# Patient Record
Sex: Female | Born: 1935 | Race: White | Hispanic: No | Smoking: Former smoker
Health system: Southern US, Community
[De-identification: ages and names within clinical notes are randomized; demographics above are authoritative.]

## PROBLEM LIST (undated history)

## (undated) DIAGNOSIS — I82409 Acute embolism and thrombosis of unspecified deep veins of unspecified lower extremity: Secondary | ICD-10-CM

## (undated) DIAGNOSIS — E039 Hypothyroidism, unspecified: Secondary | ICD-10-CM

## (undated) DIAGNOSIS — E119 Type 2 diabetes mellitus without complications: Secondary | ICD-10-CM

## (undated) DIAGNOSIS — E7800 Pure hypercholesterolemia, unspecified: Secondary | ICD-10-CM

## (undated) DIAGNOSIS — I639 Cerebral infarction, unspecified: Secondary | ICD-10-CM

## (undated) HISTORY — DX: Pure hypercholesterolemia, unspecified: E78.00

## (undated) HISTORY — DX: Type 2 diabetes mellitus without complications: E11.9

## (undated) HISTORY — DX: Hypothyroidism, unspecified: E03.9

## (undated) HISTORY — PX: NO PAST SURGERIES: SHX2092

## (undated) HISTORY — DX: Acute embolism and thrombosis of unspecified deep veins of unspecified lower extremity: I82.409

## (undated) HISTORY — DX: Cerebral infarction, unspecified: I63.9

---

## 1997-11-12 ENCOUNTER — Other Ambulatory Visit: Admission: RE | Admit: 1997-11-12 | Discharge: 1997-11-12 | Payer: Self-pay | Admitting: Gynecology

## 1997-12-13 ENCOUNTER — Ambulatory Visit (HOSPITAL_BASED_OUTPATIENT_CLINIC_OR_DEPARTMENT_OTHER): Admission: RE | Admit: 1997-12-13 | Discharge: 1997-12-13 | Payer: Self-pay | Admitting: Surgery

## 1998-06-29 DIAGNOSIS — Z86718 Personal history of other venous thrombosis and embolism: Secondary | ICD-10-CM

## 1998-06-29 HISTORY — DX: Personal history of other venous thrombosis and embolism: Z86.718

## 1998-12-19 ENCOUNTER — Other Ambulatory Visit: Admission: RE | Admit: 1998-12-19 | Discharge: 1998-12-19 | Payer: Self-pay | Admitting: Gynecology

## 2000-03-10 ENCOUNTER — Other Ambulatory Visit: Admission: RE | Admit: 2000-03-10 | Discharge: 2000-03-10 | Payer: Self-pay | Admitting: Gynecology

## 2001-04-26 ENCOUNTER — Other Ambulatory Visit: Admission: RE | Admit: 2001-04-26 | Discharge: 2001-04-26 | Payer: Self-pay | Admitting: Gynecology

## 2002-05-03 ENCOUNTER — Other Ambulatory Visit: Admission: RE | Admit: 2002-05-03 | Discharge: 2002-05-03 | Payer: Self-pay | Admitting: Gynecology

## 2003-03-06 ENCOUNTER — Encounter: Payer: Self-pay | Admitting: Emergency Medicine

## 2003-03-06 ENCOUNTER — Observation Stay (HOSPITAL_COMMUNITY): Admission: EM | Admit: 2003-03-06 | Discharge: 2003-03-07 | Payer: Self-pay | Admitting: Emergency Medicine

## 2003-10-25 ENCOUNTER — Other Ambulatory Visit: Admission: RE | Admit: 2003-10-25 | Discharge: 2003-10-25 | Payer: Self-pay | Admitting: Dermatology

## 2004-12-17 ENCOUNTER — Other Ambulatory Visit: Admission: RE | Admit: 2004-12-17 | Discharge: 2004-12-17 | Payer: Self-pay | Admitting: Gynecology

## 2005-03-24 ENCOUNTER — Encounter (INDEPENDENT_AMBULATORY_CARE_PROVIDER_SITE_OTHER): Payer: Self-pay | Admitting: Internal Medicine

## 2005-03-24 ENCOUNTER — Ambulatory Visit: Payer: Self-pay | Admitting: Internal Medicine

## 2005-03-24 ENCOUNTER — Ambulatory Visit (HOSPITAL_COMMUNITY): Admission: RE | Admit: 2005-03-24 | Discharge: 2005-03-24 | Payer: Self-pay | Admitting: Internal Medicine

## 2006-03-18 ENCOUNTER — Other Ambulatory Visit: Admission: RE | Admit: 2006-03-18 | Discharge: 2006-03-18 | Payer: Self-pay | Admitting: Gynecology

## 2007-03-23 ENCOUNTER — Other Ambulatory Visit: Admission: RE | Admit: 2007-03-23 | Discharge: 2007-03-23 | Payer: Self-pay | Admitting: Gynecology

## 2007-05-28 ENCOUNTER — Emergency Department (HOSPITAL_COMMUNITY): Admission: EM | Admit: 2007-05-28 | Discharge: 2007-05-28 | Payer: Self-pay | Admitting: Family Medicine

## 2010-04-17 ENCOUNTER — Ambulatory Visit: Payer: Self-pay | Admitting: Internal Medicine

## 2010-04-24 ENCOUNTER — Ambulatory Visit (HOSPITAL_COMMUNITY)
Admission: RE | Admit: 2010-04-24 | Discharge: 2010-04-24 | Payer: Self-pay | Source: Home / Self Care | Admitting: Internal Medicine

## 2010-04-24 ENCOUNTER — Ambulatory Visit: Payer: Self-pay | Admitting: Internal Medicine

## 2010-11-14 NOTE — Discharge Summary (Signed)
Maria, Alexander NO.:  [id number]   MEDICAL RECORD NO.:  [id number]                   PATIENT TYPE:  INP   LOCATION:  0355                                 FACILITY:  [facility]   PHYSICIAN:  Jackie Plum, M.D.             DATE OF BIRTH:  [birth year]   DATE OF ADMISSION:  03/06/2003  DATE OF DISCHARGE:  03/07/2003                                 DISCHARGE SUMMARY   DISCHARGE DIAGNOSES:  1. Intermittent shortness of breath with dyspnea on exertion, probably     secondary to diastolic heart failure, to rule out angina as an     outpatient.  The patient is scheduled for an outpatient 2-dimensional     echocardiogram and a stress test.  2. Systolic hypertension.  3. Dyslipidemia.  4. Decreased thyroid stimulating hormone without any clinical evidence of     hyperthyroidism.  An outpatient followup with free T4 and T3 is     recommended.  5. Hyperglycemia with increased hemoglobin A1c, to rule out diabetes     mellitus with fasting blood glucose as an outpatient with her primary     care physician, in view of a family history of diabetes.  6. Macrocytic anemia, consistent with iron deficiency:  The patient alleges     that she had a colonoscopy two years ago which was probably normal.     Outpatient followup with her primary care physician is recommended.   DISCHARGE MEDICATIONS:  1. Aspirin 325 mg p.o. daily.  2. Avapro 150 mg daily.  3. Lipitor which has been increased to 40 mg p.o. q.h.s.  4. Multivitamin 1 tablet p.o. daily.  5. Iron sulfate 325 mg p.o. b.i.d.   DISCHARGE LABORATORY DATA:  WBC count 7.0, hemoglobin 10.7, MCV 74.5,  hematocrit 33.3, platelet count 247.  ESR 27, INR 1.0, PTT 31.  Sodium 141,  potassium 4.6, chloride 105, CO2 of 29, glucose 161, BUN 9, creatinine 0.8,  calcium 9.7.  Hemoglobin A1c of 8.5%.  TSH 0.031.  Iron 20, total iron  binding capacity 451, iron saturation 5%, Ferritin 4.   ACTIVITY:  As tolerated.   DIET:  To be carried out by special instructions.  She will report to her  M.D. if she experiences any problems.  She has been counseled to change her  eating habits and try to eat more fruits and vegetables.   FOLLOW UP:  For a followup appointment, the patient is to call her primary  care physician, Dr. Delaney Meigs, to see him in the next 7-10 days.  She is to follow up with Dr. Cristy Hilts. Jacinto Halim on March 20, 2003.  Prior to  this she will go to Dr. Verl Dicker office on March 14, 2003, for a 2-D  echocardiogram as well as a stress test.   REASON FOR ADMISSION:  Intermittent two to three-month-history of shortness  of breath and dyspnea on  exertion.   HISTORY OF PRESENT ILLNESS:  The patient is a 77 year old Caucasian lady who  presented with a history of shortness of breath intermittently which was  worse with exertion.  No history of fever, chills, sore throat, sinus  infection, nasal congestion, cough, sputum production, or chest pain.  She  did not have any palpitations, paroxysmal nocturnal dyspnea, or orthopnea.  She is post-menopausal and denies having any abnormal vaginal bleeding.  She  did not have any lower extremity swelling, no pain.   PHYSICAL EXAMINATION:  On admission her systolic blood pressure was  elevated, and her cardiac examination did not reveal any gallops.  She had  pale conjunctivae without any icterus.  She did not have any edema in her  extremities.   LABORATORY DATA:  Her admitting electrocardiogram was notable for a sinus  rhythm at 82 beats per minute without any acute ST-wave changes. X-rays  showed mildly hyperinflated lung fields without any acute infiltrates or  cardiomegaly.  Hemoglobin 10.7 with MCV of 74.5.  Her glucose was mildly  elevated at 121 with normal renal function.  Her liver function tests were  unremarkable.   HOSPITAL COURSE:  The patient lives about 30 minutes from [place], and  therefore I felt it was expedient to admit  her for 24 to 48-hours  observation to rule out a cardiac ischemia/myocardial infarction.  1. SHORTNESS OF BREATH/DYSPNEA ON EXERTION:  The patient was admitted to a     telemetry bed.  A myocardial infarction was ruled out by serial cardiac     enzymes.  There were no significant dysrhythmias on telemetry monitoring.     The patient was seen in consultation by Dr. Jacinto Halim of cardiology, who felt     that the patient may have diastolic heart failure; however, he agreed     that he could not rule out angina.  The patient looks very great and     asymptomatic at rest, and Dr. Jacinto Halim thought it might be expedient to just     discharge the patient today and schedule her for outpatient stress test     and a 2-D echocardiogram, as mentioned above.  She is being discharged     home on Avapro added to her medication regimen, in view of her systolic     hypertension.  Her discharge blood pressure was 127 mmHg.  The patient     has expressed subjective improvement in her symptoms of shortness of     breath.  Her last O2 saturation on room air was 96%.  2. IRON DEFICIENCY ANEMIA:  The patient was found to have macrocytic anemia.     An anemia workup indicated that she has iron deficiency anemia, as     mentioned above.  The patient said that she had a normal colonoscopy     about two years ago.  She also reiterates that she lives by herself and     has been eating a lot of snacks, and has not been eating well.  I believe     that her iron deficiency anemia may be related to diet, which is quite     uncommon but it is possible, and I have already spent some time     counseling her about dietary discretion.  She is going to be followed by     her primary care physician, Dr. Lysbeth Galas, as an outpatient, at which time     her colonoscopy report should be reviewed, and she  need to be reinforced     and counseled and guided in dietary choices, and a followup of her    hemoglobin.  She has been started on a  multivitamin and iron tablets.  If     she shows no improvement within three to six months on these medications     and with an appropriate diet discretion, the patient may benefit from a     hematology consultation as an outpatient.  3. HYPERGLYCEMIA WITH INCREASED HEMOGLOBIN A1C:  The patient was noted to be     hyperglycemic with an admitting glucose level of 161 mg/dL.  She as a     history of diabetes mellitus in her brother.  Her hemoglobin A1c was     elevated at 8.5%.  The patient could not be specifically ruled out for     diabetes during this hospitalization, and she could benefit from an     outpatient fasting glucose to rule out possible diabetes.  No dietary     restrictions have been placed on her.  She said she eats a lot of sweets,     and I counseled with her the need to stop these practices.  4. DYSLIPIDEMIA:  The patient was on Lipitor for dyslipidemia.  A fasting     lipid panel was requested during this hospitalization but, however,     unfortunately could not be obtained prior to discharge.  Her Lipitor will     be increased to 40 mg p.o. daily from 20 mg.  She will need a repeat of     her liver function tests in about three months.    NOTATION:  I spent more than 50 minutes discharging this patient today.  I  spent a good amount of time talking to the patient and her sister and  brother at the bedside.  During this time I explained all the workup which  had been done during her hospitalization and subsequent workup that she  needs for followup.  They expressed understanding.                                                Jackie Plum, M.D.    GO/MEDQ  D:  03/07/2003  T:  03/07/2003  Job:  [id number]   cc:   Delaney Meigs, M.D.  [address]  [place]  [place] [address]  Fax: [phone]   Yates Decamp, M.D.

## 2010-11-14 NOTE — H&P (Signed)
Maria Alexander, [name] NO.:  [id number]   MEDICAL RECORD NO.:  [id number]                   PATIENT TYPE:  EMS   LOCATION:  ED                                   FACILITY:  [facility]   PHYSICIAN:  Jackie Plum, M.D.             DATE OF BIRTH:  October 18, 1923   DATE OF ADMISSION:  03/06/2003  DATE OF DISCHARGE:                                HISTORY & PHYSICAL   REASON FOR ADMISSION:  For 24 to 48-hour observation.   PROBLEM LIST:  1. Two to three-month history of shortness of breath and dyspnea on     exertion, to rule out anginal equivalent.  2. Microcytic anemia with admitting hemoglobin of 10.7, MCV 74.5, RDW 15.8,     to rule out iron deficiency anemia.  3. Hyperglycemia with glucose of 151, mild.  Rule out diabetes mellitus.  4. History of dyslipidemia.  5. Questionable history of hypertension.   CHIEF COMPLAINT:  Shortness of breath.   HISTORY OF PRESENT ILLNESS:  The patient presents with a two to three-month  history of shortness of breath intermittently which is worse with exertion  and when she is going up stairs.  She denies any history of fever, chills,  sore throat, sinus drainage, congestion, cough, sputum production, chest  pain, palpitations, paroxysmal nocturnal dyspnea, or orthopnea.  No history  of abdominal pain, nausea, vomiting, diarrhea, black or bloody stools, or  melena.  No history of dysuria or frequency of urination.  The patient is  postmenopausal and does not have any abnormal vaginal bleeding.  She denies  any lower extremity swelling or pain.  She does not have any skin rashes.  She denies any headaches, loss of consciousness, or dizziness.  No history  of motor or sensory deficit.   PAST MEDICAL HISTORY:  She gives history of previous left lower extremity  deep vein thrombosis which was presumed to be related to Prempro which she  has stopped taking since then.  Otherwise, past medical history is as noted  in  Problem List above.   The patient is unsure whether or not she may have hypertension.  She is a  twin and had twin sister claim that she also has hypertension; however, she  has not been taking medications for this.   ALLERGIES:  No medication allergies.   CURRENT MEDICATIONS:  1. Lipitor 20 mg daily.  2. Aspirin 1 tablet daily.  3. Fosamax weekly.  4. Xanax as needed.   FAMILY HISTORY:  Father died of heart attack at age 31, and brother had  heart surgery.  No evidence of diabetes in family.   SOCIAL HISTORY:  The patient is a retired Education officer, community.  She has two grownup  children.  She lives by herself. She is not married and does not drink  alcohol or smoke cigarettes.   REVIEW OF SYSTEMS:  Significant postives and negatives per HPI  above,  otherwise Review of Systems unremarkable.   PHYSICAL EXAMINATION:  VITAL SIGNS:  Blood pressure 178/84, temperature  96.5, pulse rate 89, respiratory rate 18.  O2 saturation 98% on room air.  GENERAL:  At admission, she was not acutely ill looking.  She was not in  acute pulmonary distress.  HEENT:  Normocephalic and atraumatic.  Pupils are equal, round, and reactive  to light.  She had mild conjunctival pallor without icterus.  Oropharynx was  moist without any oropharyngeal exudate or erythema.  NECK:  No JVD appreciated.  No carotid bruits appreciated.  LUNGS:  Breath sounds were vesicular.  HEART:  Without gallops or murmurs.  ABDOMEN:  Smooth, nontender.  No organomegaly was appreciated.  EXTREMITIES:  No edema.  Dorsalis pedis pulse was present about 2+.  NEUROLOGIC:  Alert and oriented x 3 without focal deficits.   LABORATORY DATA:  A 12-lead EKG: Sinus rhythm at 82 beats per minute with T  wave inversion in V1.  No acute ST wave changes.   Chest x-ray showed mildly hyperventilated lung fields without acute features  or cardiomegaly.   CBC: White cell count 7.0, hemoglobin 10.7, hematocrit 33.3, MCV 74.5,  Platelet count 247.   Sodium 141, potassium 4.6, chloride 105, CO2 29,  glucose 161, BUN 9, creatinine 0.8, calcium 9.7, total protein 7.5, albumin  4.0, AST 19, ALT 18, alkaline phosphatase 76, total bilirubin 0.5.  First  set of enzymes: CK total 105, CK-MB 3.6, relative index slightly elevated at  3.4, troponin I 0.01.   ASSESSMENT:  A 77 year old lady presenting to two to three-month history of  shortness of breath without any chest pain, orthopnea, or paroxysmal  nocturnal dyspnea.  The patient is mildly pale on exam, has hemoglobin of  10.7 with MCV of 74.5 consistent with microcytic anemia.  No history of  bright red blood per rectum, melanic stools, or abnormal bleeding.  She does  not have any hemoptysis or hematemesis.  She is postmenopausal.  She has a  history of dyslipidemia and some history of heart disease with question of  history of hypertension.  The 12-lead EKG and chest x-ray were unremarkable.   My impression is shortness of breath in a 77 year old lady with significant  cardiac risk factors to rule out anginal equivalent.  The patient has  microcytic anemia which may account for her symptoms; however, the degree of  anemia is inconsistent with the degree of he symptomatology.    PLAN:  The plan of care will be to admit the patient to a telemetry bed,  obtain serial cardiac enzymes and possible stress test in the morning if she  rules out.  Will obtain TSH and fasting lipid panel in the morning.  She  will also be given supportive care as needed.  Anemia workup will be  obtained during this hospitalization.                                               Jackie Plum, M.D.    GO/MEDQ  D:  03/06/2003  T:  03/06/2003  Job:  [id number]   cc:   Delaney Meigs, M.D.  [address]  [place]  [place] [address]  Fax: [phone]

## 2010-11-14 NOTE — Op Note (Signed)
NAMEREYNALDA, CANNY                ACCOUNT NO.:  [id number]   MEDICAL RECORD NO.:  [id number]          PATIENT TYPE:  AMB   LOCATION:  DAY                           FACILITY:  [facility]   PHYSICIAN:  Lionel December, M.D.    DATE OF BIRTH:  1924/04/10   DATE OF PROCEDURE:  03/24/2005  DATE OF DISCHARGE:                                 OPERATIVE REPORT   PROCEDURE:  Colonoscopy.   INDICATIONS:  Maria Alexander is 77 year old Caucasian female with history of  colonic polyps. Her last exam was by Dr. Marcell Anger four or five years ago  with removal of three polyps. She opted to have this exam at [facility]. Procedure risks were reviewed with the patient, and informed  consent was obtained.   PREMEDICATION:  Demerol 50 mg IV, Versed 6 mg IV.   FINDINGS:  Procedure performed in endoscopy suite. The patient's vital signs  and O2 saturation were monitored during the procedure remained stable. The  patient was placed in left lateral position and rectal examination  performed. She had large soft sentinel skin tags. Digital exam was normal.  Scope was placed in rectum and advanced under vision into sigmoid colon  which was noncompliant with a tight loop. Had to use abdominal pressure and  loop in order to pass the scope into descending colon. Further intubation  cecum was easy. Preparation was excellent. Few small diverticula were noted  at sigmoid colon. Cecal landmarks, i.e. appendiceal orifice, ileocecal valve  were well seen and picture taken for the record. There was single small  polyp across from the ileocecal valve which was ablated via cold biopsy.  Mucosa of the rest of the colon was normal. Rectal mucosa similarly was  normal. Scope was retroflexed to examine anorectal junction, and small  hemorrhoids were noted below the dentate line. Endoscope was straightened  and withdrawn. The patient tolerated the procedure well.   FINAL DIAGNOSES:  1.  Small cecal polyp which was ablated via  cold biopsy.  2.  Few tiny diverticula at sigmoid colon and external hemorrhoids.   RECOMMENDATIONS:  Standard instructions given. I will be contacting the  patient with biopsy results and further recommendations.      Lionel December, M.D.  Electronically Signed     NR/MEDQ  D:  03/24/2005  T:  03/24/2005  Job:  [id number]   cc:   Gretta Cool, M.D.  Fax: [phone]   Delaney Meigs, M.D.  Fax: [phone]

## 2014-06-06 ENCOUNTER — Ambulatory Visit (INDEPENDENT_AMBULATORY_CARE_PROVIDER_SITE_OTHER): Payer: Medicare Other | Admitting: Internal Medicine

## 2014-06-06 ENCOUNTER — Encounter (INDEPENDENT_AMBULATORY_CARE_PROVIDER_SITE_OTHER): Payer: Self-pay | Admitting: Internal Medicine

## 2014-06-06 ENCOUNTER — Encounter (INDEPENDENT_AMBULATORY_CARE_PROVIDER_SITE_OTHER): Payer: Self-pay | Admitting: *Deleted

## 2014-06-06 VITALS — BP 138/84 | HR 64 | Temp 97.4°F | Ht 63.0 in | Wt 133.6 lb

## 2014-06-06 DIAGNOSIS — R1314 Dysphagia, pharyngoesophageal phase: Secondary | ICD-10-CM

## 2014-06-06 DIAGNOSIS — E7800 Pure hypercholesterolemia, unspecified: Secondary | ICD-10-CM

## 2014-06-06 DIAGNOSIS — E1165 Type 2 diabetes mellitus with hyperglycemia: Secondary | ICD-10-CM | POA: Insufficient documentation

## 2014-06-06 DIAGNOSIS — E039 Hypothyroidism, unspecified: Secondary | ICD-10-CM | POA: Insufficient documentation

## 2014-06-06 DIAGNOSIS — E1122 Type 2 diabetes mellitus with diabetic chronic kidney disease: Secondary | ICD-10-CM | POA: Insufficient documentation

## 2014-06-06 DIAGNOSIS — E119 Type 2 diabetes mellitus without complications: Secondary | ICD-10-CM | POA: Insufficient documentation

## 2014-06-06 NOTE — Patient Instructions (Signed)
Esophagram. Chew food well. Small portions.

## 2014-06-06 NOTE — Progress Notes (Signed)
   Subjective:    Patient ID: Maria Alexander, female    DOB: 1923-09-07, 80 y.o.   MRN: [id number]  HPI Presents today with c/o dysphagia. She tells me recently, she choked on a piece of pork chop.  She thinks it went down her "windpipe". She then says it may have lodged in her esophagus.   This has occurred 3 times within the month. Appetite is good. No weight loss.  She can eat about anything she wants. She cannot eat shell fish.  BMs are moving normal.  Sister has hx of dysphagia and has undergone an EGD/ED in the past    Review of Systems  Married. Three children in good health. Retired. Widowed  Past Medical History  Diagnosis Date  . Diabetes   . High cholesterol   . Hypothyroid     No past surgical history on file.  Allergies not on file  No current outpatient prescriptions on file prior to visit.   No current facility-administered medications on file prior to visit.        Objective:   Physical Exam  Filed Vitals:   06/06/14 1600  Height: 5\' 3"  (1.6 m)  Weight: 133 lb 9.6 oz (60.601 kg)  Alert and oriented. Skin warm and dry. Oral mucosa is moist.   . Sclera anicteric, conjunctivae is pink. Thyroid not enlarged. No cervical lymphadenopathy. Lungs clear. Heart regular rate and rhythm.  Abdomen is soft. Bowel sounds are positive. No hepatomegaly. No abdominal masses felt. No tenderness.  No edema to lower extremities.           Assessment & Plan:  DG esophagram. Further recommendations to follow.

## 2014-06-07 ENCOUNTER — Ambulatory Visit (HOSPITAL_COMMUNITY)
Admission: RE | Admit: 2014-06-07 | Discharge: 2014-06-07 | Disposition: A | Discharge status: Home / Self Care | Payer: Medicare Other | Source: Ambulatory Visit | Attending: Internal Medicine | Admitting: Internal Medicine

## 2014-06-07 DIAGNOSIS — R1310 Dysphagia, unspecified: Secondary | ICD-10-CM | POA: Insufficient documentation

## 2014-06-07 DIAGNOSIS — R1314 Dysphagia, pharyngoesophageal phase: Secondary | ICD-10-CM

## 2014-07-05 ENCOUNTER — Telehealth (INDEPENDENT_AMBULATORY_CARE_PROVIDER_SITE_OTHER): Payer: Self-pay | Admitting: *Deleted

## 2014-07-05 NOTE — Telephone Encounter (Signed)
[employer] called and said insurance denied payment for DOS: 06/06/14. They will not make payment on a mid-level, Doctor only.

## 2014-07-09 NOTE — Telephone Encounter (Signed)
Sent in a corrected claim request.  Called Ms. Hakes and she was told the same.

## 2014-07-11 ENCOUNTER — Ambulatory Visit (INDEPENDENT_AMBULATORY_CARE_PROVIDER_SITE_OTHER): Payer: Medicare Other | Admitting: Internal Medicine

## 2014-07-11 ENCOUNTER — Encounter (INDEPENDENT_AMBULATORY_CARE_PROVIDER_SITE_OTHER): Payer: Self-pay | Admitting: Internal Medicine

## 2014-07-11 VITALS — BP 128/60 | HR 66 | Temp 97.9°F | Ht 64.0 in | Wt 135.0 lb

## 2014-07-11 DIAGNOSIS — R1314 Dysphagia, pharyngoesophageal phase: Secondary | ICD-10-CM

## 2014-07-11 NOTE — Patient Instructions (Signed)
OV in 1 year.  If any problems call our office.

## 2014-07-11 NOTE — Progress Notes (Signed)
   Subjective:    Patient ID: Maria Alexander, female    DOB: October 09, 1923, 81 y.o.   MRN: [id number]  HPIHere today for f/u of her dysphagia.  She tells me today she is not having any problems with dysphagia now.  She tells me she is doing good. No foods are lodging. She chews her foods well. Her appetite has remained good. No weight loss. BMs are normal. No melena or BRRB. She exercises at least once a week, walks 35 minutes. Her last colonoscopy was in 2011 revealed two small diverticula at ascending colon, otherwise normal colonoscopy. 06/07/2014 DG Esophagram. Dysphagia IMPRESSION: Mild functional narrowing at the gastroesophageal junction as described. The barium tablet did eventually pass.  Otherwise unremarkable barium swallow.         Review of Systems Past Medical History  Diagnosis Date  . Diabetes   . High cholesterol   . Hypothyroid     No past surgical history on file.  No Known Allergies  Current Outpatient Prescriptions on File Prior to Visit  Medication Sig Dispense Refill  . atorvastatin (LIPITOR) 80 MG tablet Take 40 mg by mouth daily.     Marland Kitchen glipiZIDE (GLUCOTROL XL) 2.5 MG 24 hr tablet Take 2.5 mg by mouth daily with breakfast.    . levothyroxine (SYNTHROID, LEVOTHROID) 50 MCG tablet Take 50 mcg by mouth daily before breakfast.    . losartan (COZAAR) 100 MG tablet Take 100 mg by mouth daily.    . Multiple Vitamin (MULTIVITAMIN) tablet Take 1 tablet by mouth daily.    . Omega-3 Fatty Acids (FISH OIL) 1000 MG CAPS Take by mouth.     No current facility-administered medications on file prior to visit.    Widowed. Three children. One deceased from a fall (alcoholic)    Objective:   Physical Exam  Filed Vitals:   07/11/14 0937  Height: 5\' 4"  (1.626 m)  Weight: 135 lb (61.236 kg)   Alert and oriented. Skin warm and dry. Oral mucosa is moist.   . Sclera anicteric, conjunctivae is pink. Thyroid not enlarged. No cervical lymphadenopathy. Lungs clear. Heart  regular rate and rhythm.  Abdomen is soft. Bowel sounds are positive. No hepatomegaly. No abdominal masses felt. No tenderness.  No edema to lower extremities.          Assessment & Plan:  Dysphagia. No problems swallowing now.  Will see back in one year. If any problems, call our office.

## 2015-01-10 ENCOUNTER — Other Ambulatory Visit: Payer: Self-pay

## 2015-01-10 DIAGNOSIS — I7092 Chronic total occlusion of artery of the extremities: Secondary | ICD-10-CM

## 2015-02-04 ENCOUNTER — Encounter: Payer: Self-pay | Admitting: Vascular Surgery

## 2015-02-05 ENCOUNTER — Encounter: Payer: Self-pay | Admitting: Vascular Surgery

## 2015-02-05 ENCOUNTER — Ambulatory Visit (INDEPENDENT_AMBULATORY_CARE_PROVIDER_SITE_OTHER): Payer: Medicare Other | Admitting: Vascular Surgery

## 2015-02-05 ENCOUNTER — Ambulatory Visit (HOSPITAL_COMMUNITY)
Admission: RE | Admit: 2015-02-05 | Discharge: 2015-02-05 | Disposition: A | Discharge status: Home / Self Care | Payer: Medicare Other | Source: Ambulatory Visit | Attending: Vascular Surgery | Admitting: Vascular Surgery

## 2015-02-05 VITALS — BP 132/74 | HR 73 | Ht 64.0 in | Wt 134.1 lb

## 2015-02-05 DIAGNOSIS — S81002A Unspecified open wound, left knee, initial encounter: Secondary | ICD-10-CM

## 2015-02-05 DIAGNOSIS — R208 Other disturbances of skin sensation: Secondary | ICD-10-CM | POA: Insufficient documentation

## 2015-02-05 DIAGNOSIS — I7092 Chronic total occlusion of artery of the extremities: Secondary | ICD-10-CM

## 2015-02-05 DIAGNOSIS — E785 Hyperlipidemia, unspecified: Secondary | ICD-10-CM | POA: Insufficient documentation

## 2015-02-05 DIAGNOSIS — S91002A Unspecified open wound, left ankle, initial encounter: Secondary | ICD-10-CM | POA: Diagnosis not present

## 2015-02-05 DIAGNOSIS — S81802A Unspecified open wound, left lower leg, initial encounter: Secondary | ICD-10-CM

## 2015-02-05 DIAGNOSIS — Z87891 Personal history of nicotine dependence: Secondary | ICD-10-CM | POA: Diagnosis not present

## 2015-02-05 DIAGNOSIS — E119 Type 2 diabetes mellitus without complications: Secondary | ICD-10-CM | POA: Insufficient documentation

## 2015-02-05 NOTE — Progress Notes (Signed)
Patient name: Maria Alexander MRN: [id number] DOB: 1923/10/27 Sex: female   Referred by: Maria Alexander  Reason for referral:  Chief Complaint  Patient presents with  . New Evaluation    eval PAD - c/o bilateral LE coldness X 6 months or more    HISTORY OF PRESENT ILLNESS: This today for evaluation of lower from the arterial circulation. She is an extremely pleasant 81 year old female who is very active and very healthy. She had trivial trauma to her pretibial area on the left and has had prolonged healing of this. She also is concerned that her feet feel cold to her from a sensation standpoint and she was concerned about arterial flow. She is being treated appropriately should. She has had one round of oral anabiotic 7 has been treated with Silvadene over the open area as well. She denies any other tissue loss. Does have a remote history of DVT approximately 15 years ago but does not have any sequela of this. He specifically does not have any swelling. She is not on anticoagulation therapy  Past Medical History  Diagnosis Date  . Diabetes   . High cholesterol   . Hypothyroid   . Stroke   . DVT (deep venous thrombosis)     History reviewed. No pertinent past surgical history.  History   Social History  . Marital Status: Widowed    Spouse Name: N/A  . Number of Children: N/A  . Years of Education: N/A   Occupational History  . Not on file.   Social History Main Topics  . Smoking status: Former Smoker    Types: Cigarettes    Quit date: 02/05/1995  . Smokeless tobacco: Not on file  . Alcohol Use: No  . Drug Use: No  . Sexual Activity: Not on file   Other Topics Concern  . Not on file   Social History Narrative    Family History  Problem Relation Age of Onset  . Cancer Mother   . Heart disease Father     before age 94  . Heart attack Father   . Diabetes Sister   . Diabetes Brother     Allergies as of 02/05/2015  . (No Known Allergies)    Current Outpatient  Prescriptions on File Prior to Visit  Medication Sig Dispense Refill  . Aspirin-Acetaminophen-Caffeine (GOODY HEADACHE PO) Take by mouth every morning.    Marland Kitchen atorvastatin (LIPITOR) 80 MG tablet Take 40 mg by mouth daily.     Marland Kitchen glipiZIDE (GLUCOTROL XL) 2.5 MG 24 hr tablet Take 5 mg by mouth daily with breakfast.     . levothyroxine (SYNTHROID, LEVOTHROID) 50 MCG tablet Take 50 mcg by mouth daily before breakfast.    . losartan (COZAAR) 100 MG tablet Take 100 mg by mouth daily.    . Multiple Vitamin (MULTIVITAMIN) tablet Take 1 tablet by mouth daily.     No current facility-administered medications on file prior to visit.     REVIEW OF SYSTEMS:  Positives indicated with an "X"  CARDIOVASCULAR:  [ ]  chest pain   [ ]  chest pressure   [ ]  palpitations   [ ]  orthopnea   [ ]  dyspnea on exertion   [ ]  claudication   [ ]  rest pain   [x ] DVT   [ ]  phlebitis PULMONARY:   [ ]  productive cough   [ ]  asthma   [ ]  wheezing NEUROLOGIC:   [ ]  weakness  [ ]  paresthesias  [ ]   aphasia  [ ]  amaurosis  [ ]  dizziness HEMATOLOGIC:   [ ]  bleeding problems   [ ]  clotting disorders MUSCULOSKELETAL:  [ ]  joint pain   [ ]  joint swelling GASTROINTESTINAL: [ ]   blood in stool  [ ]   hematemesis GENITOURINARY:  [ ]   dysuria  [ ]   hematuria PSYCHIATRIC:  [ ]  history of major depression INTEGUMENTARY:  [ ]  rashes  [ ]  ulcers CONSTITUTIONAL:  [ ]  fever   [ ]  chills  PHYSICAL EXAMINATION:  General: The patient is a well-nourished female, in no acute distress. Vital signs are BP 132/74 mmHg  Pulse 73  Ht 5\' 4"  (1.626 m)  Wt 134 lb 1.6 oz (60.827 kg)  BMI 23.01 kg/m2  SpO2 96% Pulmonary: There is a good air exchange  Musculoskeletal: There are no major deformities.  There is no significant extremity pain. Neurologic: No focal weakness or paresthesias are detected, Skin: Does have a 2 x 3 cm superficial ulceration over her pretibial area from skin tear. This is not any significant surrounding erythema Psychiatric:  The patient has normal affect. Cardiovascular: 2+ radial, 2+ dorsalis pedis and 2+ posterior tibial pulses bilaterally   [facility] Vascular Lab Studies:  Ordered and Independently Reviewed normal ankle arm index and normal triphasic tibial waveforms bilaterally  Impression and Plan:  No evidence of peripheral vascular occlusive disease and no evidence of sequela from her old history of DVT. I reassured her that she should not have difficulty initially healing this skin tear on her pretibial area. Do not see any indication for repeat oral anabiotic. Explained the need to continue the Silvadene ointment treatment. She was reassured with this discussion will see [place] again on as-needed basis    Maria Alexander [facility] Office: [phone]

## 2015-02-12 ENCOUNTER — Encounter: Payer: Self-pay | Admitting: Physician Assistant

## 2015-02-19 ENCOUNTER — Encounter (HOSPITAL_COMMUNITY): Payer: Medicare Other

## 2015-02-19 ENCOUNTER — Encounter: Payer: Medicare Other | Admitting: Vascular Surgery

## 2015-05-08 ENCOUNTER — Encounter (INDEPENDENT_AMBULATORY_CARE_PROVIDER_SITE_OTHER): Payer: Self-pay | Admitting: *Deleted

## 2015-07-18 ENCOUNTER — Ambulatory Visit (INDEPENDENT_AMBULATORY_CARE_PROVIDER_SITE_OTHER): Payer: Medicare Other | Admitting: Internal Medicine

## 2016-09-16 ENCOUNTER — Telehealth: Payer: Self-pay | Admitting: Physician Assistant

## 2016-09-16 MED ORDER — DOXYCYCLINE HYCLATE 100 MG PO TABS: 100.0000 mg | ORAL_TABLET | Freq: Two times a day (BID) | ORAL | 0 refills | Status: DC

## 2016-09-16 MED ORDER — PREDNISONE 10 MG (21) PO TBPK: ORAL_TABLET | ORAL | 0 refills | Status: DC

## 2016-11-12 ENCOUNTER — Ambulatory Visit (INDEPENDENT_AMBULATORY_CARE_PROVIDER_SITE_OTHER): Payer: Medicare Other | Admitting: Physician Assistant

## 2016-11-12 ENCOUNTER — Encounter: Payer: Self-pay | Admitting: Physician Assistant

## 2016-11-12 VITALS — BP 125/71 | HR 74 | Temp 98.0°F | Ht 64.0 in | Wt 128.0 lb

## 2016-11-12 DIAGNOSIS — E039 Hypothyroidism, unspecified: Secondary | ICD-10-CM

## 2016-11-12 DIAGNOSIS — E119 Type 2 diabetes mellitus without complications: Secondary | ICD-10-CM | POA: Diagnosis not present

## 2016-11-12 DIAGNOSIS — E7800 Pure hypercholesterolemia, unspecified: Secondary | ICD-10-CM | POA: Diagnosis not present

## 2016-11-12 LAB — BAYER DCA HB A1C WAIVED: HB A1C (BAYER DCA - WAIVED): 7.7 % — ABNORMAL HIGH (ref <7.0)

## 2016-11-12 MED ORDER — LORATADINE 10 MG PO TABS: 10.0000 mg | ORAL_TABLET | Freq: Every day | ORAL | 11 refills | Status: DC

## 2016-11-12 MED ORDER — LEVOTHYROXINE SODIUM 50 MCG PO TABS: 50.0000 ug | ORAL_TABLET | Freq: Every day | ORAL | 11 refills | Status: DC

## 2016-11-12 MED ORDER — LOSARTAN POTASSIUM 100 MG PO TABS: 100.0000 mg | ORAL_TABLET | Freq: Every day | ORAL | 11 refills | Status: DC

## 2016-11-12 MED ORDER — ALPRAZOLAM 1 MG PO TABS: 1.0000 mg | ORAL_TABLET | Freq: Two times a day (BID) | ORAL | 5 refills | Status: DC | PRN

## 2016-11-12 MED ORDER — GLIPIZIDE ER 5 MG PO TB24: 10.0000 mg | ORAL_TABLET | Freq: Every day | ORAL | 11 refills | Status: DC

## 2016-11-12 MED ORDER — ATORVASTATIN CALCIUM 80 MG PO TABS: 40.0000 mg | ORAL_TABLET | Freq: Every day | ORAL | 11 refills | Status: DC

## 2016-11-12 NOTE — Progress Notes (Signed)
BP 125/71   Pulse 74   Temp 98 F (36.7 C) (Oral)   Ht 5' 4" (1.626 m)   Wt 128 lb (58.1 kg)   BMI 21.97 kg/m    Subjective:    Patient ID: Maria Alexander, female    DOB: Jul 31, 1923, 83 y.o.   MRN: [id number]  HPI: Maria Alexander is a 83 y.o. female presenting on 11/12/2016 for Establish Care  This patient comes in for periodic recheck on medications and conditions including Hypothyroidism, diabetes well controlled, hyper lipidemia, overall the patient is doing very well. She reports that she does need refills. She has not have blood work done since last summer. She has not seen her endocrinologist in [place] anymore due to that she cannot drive that far anymore..   All medications are reviewed today. There are no reports of any problems with the medications. All of the medical conditions are reviewed and updated.  Lab work is reviewed and will be ordered as medically necessary. There are no new problems reported with today's visit.   Relevant past medical, surgical, family and social history reviewed and updated as indicated. Allergies and medications reviewed and updated.  Past Medical History:  Diagnosis Date  . Diabetes ([place])   . DVT (deep venous thrombosis) ([place])   . High cholesterol   . Hypothyroid   . Stroke [facility])     History reviewed. No pertinent surgical history.  Review of Systems  Constitutional: Negative.  Negative for activity change, fatigue and fever.  HENT: Negative.   Eyes: Negative.   Respiratory: Negative.  Negative for cough.   Cardiovascular: Negative.  Negative for chest pain.  Gastrointestinal: Negative.  Negative for abdominal pain.  Endocrine: Negative.   Genitourinary: Negative.  Negative for dysuria.  Musculoskeletal: Negative.   Skin: Negative.   Neurological: Negative.     Allergies as of 11/12/2016   No Known Allergies     Medication List       Accurate as of 11/12/16  2:04 PM. Always use your most recent med list.          ALPRAZolam 1 MG tablet Commonly known as:  XANAX Take 1 tablet (1 mg total) by mouth 2 (two) times daily as needed for anxiety.   atorvastatin 80 MG tablet Commonly known as:  LIPITOR Take 0.5 tablets (40 mg total) by mouth daily.   glipiZIDE 5 MG 24 hr tablet Commonly known as:  GLUCOTROL XL Take 2 tablets (10 mg total) by mouth daily with breakfast.   GOODY HEADACHE PO Take by mouth every morning.   levothyroxine 50 MCG tablet Commonly known as:  SYNTHROID, LEVOTHROID Take 1 tablet (50 mcg total) by mouth daily before breakfast.   loratadine 10 MG tablet Commonly known as:  CLARITIN Take 1 tablet (10 mg total) by mouth daily.   losartan 100 MG tablet Commonly known as:  COZAAR Take 1 tablet (100 mg total) by mouth daily.   multivitamin tablet Take 1 tablet by mouth daily.   silver sulfADIAZINE 1 % cream Commonly known as:  SILVADENE Apply 1 application topically daily.          Objective:    BP 125/71   Pulse 74   Temp 98 F (36.7 C) (Oral)   Ht 5' 4" (1.626 m)   Wt 128 lb (58.1 kg)   BMI 21.97 kg/m   No Known Allergies  Physical Exam  Constitutional: She is oriented to person, place, and time. She appears well-developed  and well-nourished.  HENT:  Head: Normocephalic and atraumatic.  Right Ear: Tympanic membrane, external ear and ear canal normal.  Left Ear: Tympanic membrane, external ear and ear canal normal.  Nose: Nose normal. No rhinorrhea.  Mouth/Throat: Oropharynx is clear and moist and mucous membranes are normal. No oropharyngeal exudate or posterior oropharyngeal erythema.  Eyes: Conjunctivae and EOM are normal. Pupils are equal, round, and reactive to light.  Neck: Normal range of motion. Neck supple.  Cardiovascular: Normal rate, regular rhythm, normal heart sounds and intact distal pulses.   Pulmonary/Chest: Effort normal and breath sounds normal.  Abdominal: Soft. Bowel sounds are normal.  Neurological: She is alert and oriented to  person, place, and time. She has normal reflexes.  Skin: Skin is warm and dry. No rash noted.  Psychiatric: She has a normal mood and affect. Her behavior is normal. Judgment and thought content normal.    Results for orders placed or performed in visit on 11/12/16  Bayer DCA Hb A1c Waived  Result Value Ref Range   Bayer DCA Hb A1c Waived 7.7 (H) <7.0 %      Assessment & Plan:   1. Acquired hypothyroidism - TSH - levothyroxine (SYNTHROID, LEVOTHROID) 50 MCG tablet; Take 1 tablet (50 mcg total) by mouth daily before breakfast.  Dispense: 30 tablet; Refill: 11  2. Type 2 diabetes mellitus without complication, without long-term current use of insulin (HCC) - CMP14+EGFR - CBC with Differential/Platelet - Lipid panel - Bayer DCA Hb A1c Waived - glipiZIDE (GLUCOTROL XL) 5 MG 24 hr tablet; Take 2 tablets (10 mg total) by mouth daily with breakfast.  Dispense: 30 tablet; Refill: 11  3. High cholesterol - CMP14+EGFR - CBC with Differential/Platelet - Lipid panel - atorvastatin (LIPITOR) 80 MG tablet; Take 0.5 tablets (40 mg total) by mouth daily.  Dispense: 30 tablet; Refill: 11    Current Outpatient Prescriptions:  .  ALPRAZolam (XANAX) 1 MG tablet, Take 1 tablet (1 mg total) by mouth 2 (two) times daily as needed for anxiety., Disp: 30 tablet, Rfl: 5 .  Aspirin-Acetaminophen-Caffeine (GOODY HEADACHE PO), Take by mouth every morning., Disp: , Rfl:  .  atorvastatin (LIPITOR) 80 MG tablet, Take 0.5 tablets (40 mg total) by mouth daily., Disp: 30 tablet, Rfl: 11 .  glipiZIDE (GLUCOTROL XL) 5 MG 24 hr tablet, Take 2 tablets (10 mg total) by mouth daily with breakfast., Disp: 30 tablet, Rfl: 11 .  levothyroxine (SYNTHROID, LEVOTHROID) 50 MCG tablet, Take 1 tablet (50 mcg total) by mouth daily before breakfast., Disp: 30 tablet, Rfl: 11 .  losartan (COZAAR) 100 MG tablet, Take 1 tablet (100 mg total) by mouth daily., Disp: 30 tablet, Rfl: 11 .  Multiple Vitamin (MULTIVITAMIN) tablet, Take 1  tablet by mouth daily., Disp: , Rfl:  .  loratadine (CLARITIN) 10 MG tablet, Take 1 tablet (10 mg total) by mouth daily., Disp: 30 tablet, Rfl: 11 .  silver sulfADIAZINE (SILVADENE) 1 % cream, Apply 1 application topically daily. , Disp: , Rfl:   Continue all other maintenance medications as listed above.  Follow up plan: Return in about 6 months (around 05/15/2017) for recheck.  Educational handout given for dash diet  Terald Sleeper PA-C [facility] [address]  [place], [place] [address] [phone]   11/12/2016, 2:04 PM

## 2016-11-12 NOTE — Patient Instructions (Signed)
DASH Eating Plan DASH stands for "Dietary Approaches to Stop Hypertension." The DASH eating plan is a healthy eating plan that has been shown to reduce high blood pressure (hypertension). It may also reduce your risk for type 2 diabetes, heart disease, and stroke. The DASH eating plan may also help with weight loss. What are tips for following this plan? General guidelines  Avoid eating more than 2,300 mg (milligrams) of salt (sodium) a day. If you have hypertension, you may need to reduce your sodium intake to 1,500 mg a day.  Limit alcohol intake to no more than 1 drink a day for nonpregnant women and 2 drinks a day for men. One drink equals 12 oz of beer, 5 oz of wine, or 1 oz of hard liquor.  Work with your health care provider to maintain a healthy body weight or to lose weight. Ask what an ideal weight is for you.  Get at least 30 minutes of exercise that causes your heart to beat faster (aerobic exercise) most days of the week. Activities may include walking, swimming, or biking.  Work with your health care provider or diet and nutrition specialist (dietitian) to adjust your eating plan to your individual calorie needs. Reading food labels  Check food labels for the amount of sodium per serving. Choose foods with less than 5 percent of the Daily Value of sodium. Generally, foods with less than 300 mg of sodium per serving fit into this eating plan.  To find whole grains, look for the word "whole" as the first word in the ingredient list. Shopping  Buy products labeled as "low-sodium" or "no salt added."  Buy fresh foods. Avoid canned foods and premade or frozen meals. Cooking  Avoid adding salt when cooking. Use salt-free seasonings or herbs instead of table salt or sea salt. Check with your health care provider or pharmacist before using salt substitutes.  Do not fry foods. Cook foods using healthy methods such as baking, boiling, grilling, and broiling instead.  Cook with  heart-healthy oils, such as olive, canola, soybean, or sunflower oil. Meal planning   Eat a balanced diet that includes: ? 5 or more servings of fruits and vegetables each day. At each meal, try to fill half of your plate with fruits and vegetables. ? Up to 6-8 servings of whole grains each day. ? Less than 6 oz of lean meat, poultry, or fish each day. A 3-oz serving of meat is about the same size as a deck of cards. One egg equals 1 oz. ? 2 servings of low-fat dairy each day. ? A serving of nuts, seeds, or beans 5 times each week. ? Heart-healthy fats. Healthy fats called Omega-3 fatty acids are found in foods such as flaxseeds and coldwater fish, like sardines, salmon, and mackerel.  Limit how much you eat of the following: ? Canned or prepackaged foods. ? Food that is high in trans fat, such as fried foods. ? Food that is high in saturated fat, such as fatty meat. ? Sweets, desserts, sugary drinks, and other foods with added sugar. ? Full-fat dairy products.  Do not salt foods before eating.  Try to eat at least 2 vegetarian meals each week.  Eat more home-cooked food and less restaurant, buffet, and fast food.  When eating at a restaurant, ask that your food be prepared with less salt or no salt, if possible. What foods are recommended? The items listed may not be a complete list. Talk with your dietitian about what   dietary choices are best for you. Grains Whole-grain or whole-wheat bread. Whole-grain or whole-wheat pasta. Brown rice. Oatmeal. Quinoa. Bulgur. Whole-grain and low-sodium cereals. Pita bread. Low-fat, low-sodium crackers. Whole-wheat flour tortillas. Vegetables Fresh or frozen vegetables (raw, steamed, roasted, or grilled). Low-sodium or reduced-sodium tomato and vegetable juice. Low-sodium or reduced-sodium tomato sauce and tomato paste. Low-sodium or reduced-sodium canned vegetables. Fruits All fresh, dried, or frozen fruit. Canned fruit in natural juice (without  added sugar). Meat and other protein foods Skinless chicken or turkey. Ground chicken or turkey. Pork with fat trimmed off. Fish and seafood. Egg whites. Dried beans, peas, or lentils. Unsalted nuts, nut butters, and seeds. Unsalted canned beans. Lean cuts of beef with fat trimmed off. Low-sodium, lean deli meat. Dairy Low-fat (1%) or fat-free (skim) milk. Fat-free, low-fat, or reduced-fat cheeses. Nonfat, low-sodium ricotta or cottage cheese. Low-fat or nonfat yogurt. Low-fat, low-sodium cheese. Fats and oils Soft margarine without trans fats. Vegetable oil. Low-fat, reduced-fat, or light mayonnaise and salad dressings (reduced-sodium). Canola, safflower, olive, soybean, and sunflower oils. Avocado. Seasoning and other foods Herbs. Spices. Seasoning mixes without salt. Unsalted popcorn and pretzels. Fat-free sweets. What foods are not recommended? The items listed may not be a complete list. Talk with your dietitian about what dietary choices are best for you. Grains Baked goods made with fat, such as croissants, muffins, or some breads. Dry pasta or rice meal packs. Vegetables Creamed or fried vegetables. Vegetables in a cheese sauce. Regular canned vegetables (not low-sodium or reduced-sodium). Regular canned tomato sauce and paste (not low-sodium or reduced-sodium). Regular tomato and vegetable juice (not low-sodium or reduced-sodium). Pickles. Olives. Fruits Canned fruit in a light or heavy syrup. Fried fruit. Fruit in cream or butter sauce. Meat and other protein foods Fatty cuts of meat. Ribs. Fried meat. Bacon. Sausage. Bologna and other processed lunch meats. Salami. Fatback. Hotdogs. Bratwurst. Salted nuts and seeds. Canned beans with added salt. Canned or smoked fish. Whole eggs or egg yolks. Chicken or turkey with skin. Dairy Whole or 2% milk, cream, and half-and-half. Whole or full-fat cream cheese. Whole-fat or sweetened yogurt. Full-fat cheese. Nondairy creamers. Whipped toppings.  Processed cheese and cheese spreads. Fats and oils Butter. Stick margarine. Lard. Shortening. Ghee. Bacon fat. Tropical oils, such as coconut, palm kernel, or palm oil. Seasoning and other foods Salted popcorn and pretzels. Onion salt, garlic salt, seasoned salt, table salt, and sea salt. Worcestershire sauce. Tartar sauce. Barbecue sauce. Teriyaki sauce. Soy sauce, including reduced-sodium. Steak sauce. Canned and packaged gravies. Fish sauce. Oyster sauce. Cocktail sauce. Horseradish that you find on the shelf. Ketchup. Mustard. Meat flavorings and tenderizers. Bouillon cubes. Hot sauce and Tabasco sauce. Premade or packaged marinades. Premade or packaged taco seasonings. Relishes. Regular salad dressings. Where to find more information:  National Heart, Lung, and Blood Institute: www.nhlbi.nih.gov  American Heart Association: www.heart.org Summary  The DASH eating plan is a healthy eating plan that has been shown to reduce high blood pressure (hypertension). It may also reduce your risk for type 2 diabetes, heart disease, and stroke.  With the DASH eating plan, you should limit salt (sodium) intake to 2,300 mg a day. If you have hypertension, you may need to reduce your sodium intake to 1,500 mg a day.  When on the DASH eating plan, aim to eat more fresh fruits and vegetables, whole grains, lean proteins, low-fat dairy, and heart-healthy fats.  Work with your health care provider or diet and nutrition specialist (dietitian) to adjust your eating plan to your individual   calorie needs. This information is not intended to replace advice given to you by your health care provider. Make sure you discuss any questions you have with your health care provider. Document Released: 06/04/2011 Document Revised: 06/08/2016 Document Reviewed: 06/08/2016 Elsevier Interactive Patient Education  2017 Elsevier Inc.  

## 2016-11-13 LAB — CMP14+EGFR
A/G RATIO: 2 (ref 1.2–2.2)
ALT: 21 IU/L (ref 0–32)
AST: 23 IU/L (ref 0–40)
Albumin: 4.2 g/dL (ref 3.2–4.6)
Alkaline Phosphatase: 63 IU/L (ref 39–117)
BUN/Creatinine Ratio: 18 (ref 12–28)
BUN: 16 mg/dL (ref 10–36)
Bilirubin Total: 0.6 mg/dL (ref 0.0–1.2)
CALCIUM: 10 mg/dL (ref 8.7–10.3)
CO2: 28 mmol/L (ref 18–29)
Chloride: 97 mmol/L (ref 96–106)
Creatinine, Ser: 0.88 mg/dL (ref 0.57–1.00)
GFR calc non Af Amer: 58 mL/min/{1.73_m2} — ABNORMAL LOW (ref >59)
GFR, EST AFRICAN AMERICAN: 67 mL/min/{1.73_m2} (ref >59)
GLOBULIN, TOTAL: 2.1 g/dL (ref 1.5–4.5)
Glucose: 166 mg/dL — ABNORMAL HIGH (ref 65–99)
POTASSIUM: 4.7 mmol/L (ref 3.5–5.2)
SODIUM: 139 mmol/L (ref 134–144)
TOTAL PROTEIN: 6.3 g/dL (ref 6.0–8.5)

## 2016-11-13 LAB — CBC WITH DIFFERENTIAL/PLATELET
BASOS: 0 %
Basophils Absolute: 0 10*3/uL (ref 0.0–0.2)
EOS (ABSOLUTE): 0.1 10*3/uL (ref 0.0–0.4)
EOS: 1 %
Hematocrit: 38 % (ref 34.0–46.6)
Hemoglobin: 12.4 g/dL (ref 11.1–15.9)
IMMATURE GRANS (ABS): 0 10*3/uL (ref 0.0–0.1)
IMMATURE GRANULOCYTES: 0 %
LYMPHS: 23 %
Lymphocytes Absolute: 1.3 10*3/uL (ref 0.7–3.1)
MCH: 31 pg (ref 26.6–33.0)
MCHC: 32.6 g/dL (ref 31.5–35.7)
MCV: 95 fL (ref 79–97)
MONOS ABS: 0.5 10*3/uL (ref 0.1–0.9)
Monocytes: 8 %
Neutrophils Absolute: 3.8 10*3/uL (ref 1.4–7.0)
Neutrophils: 68 %
Platelets: 154 10*3/uL (ref 150–379)
RBC: 4 x10E6/uL (ref 3.77–5.28)
RDW: 15.9 % — AB (ref 12.3–15.4)
WBC: 5.6 10*3/uL (ref 3.4–10.8)

## 2016-11-13 LAB — LIPID PANEL
CHOL/HDL RATIO: 3.4 ratio (ref 0.0–4.4)
Cholesterol, Total: 181 mg/dL (ref 100–199)
HDL: 54 mg/dL (ref >39)
LDL Calculated: 95 mg/dL (ref 0–99)
TRIGLYCERIDES: 160 mg/dL — AB (ref 0–149)
VLDL CHOLESTEROL CAL: 32 mg/dL (ref 5–40)

## 2016-11-13 LAB — TSH: TSH: 1.33 u[IU]/mL (ref 0.450–4.500)

## 2016-12-03 ENCOUNTER — Other Ambulatory Visit: Payer: Self-pay | Admitting: Physician Assistant

## 2016-12-03 ENCOUNTER — Other Ambulatory Visit: Payer: Self-pay | Admitting: Family Medicine

## 2016-12-03 MED ORDER — ALPRAZOLAM 1 MG PO TABS: 1.0000 mg | ORAL_TABLET | Freq: Two times a day (BID) | ORAL | 0 refills | Status: DC | PRN

## 2016-12-03 NOTE — Progress Notes (Signed)
Covering for PCP  It appears that her usual Rx is 60 pills, pt requesting to change from 30 to 60. ! Month has been approved to call in for 60 pills.   Will forward to PCP to decide for the remainder of her refills.   Laroy Apple, MD [facility] 12/03/2016, 5:57 PM

## 2016-12-04 NOTE — Progress Notes (Signed)
Patient aware, script is ready.  Xanax refill called to pharmacy per Dr. Wendi Snipes.

## 2017-01-15 ENCOUNTER — Ambulatory Visit (INDEPENDENT_AMBULATORY_CARE_PROVIDER_SITE_OTHER): Payer: Medicare Other | Admitting: Physician Assistant

## 2017-01-15 ENCOUNTER — Encounter: Payer: Self-pay | Admitting: Physician Assistant

## 2017-01-15 VITALS — BP 158/72 | HR 74 | Temp 96.9°F | Ht 64.0 in | Wt 131.0 lb

## 2017-01-15 DIAGNOSIS — R490 Dysphonia: Secondary | ICD-10-CM

## 2017-01-15 NOTE — Patient Instructions (Signed)
Hoarseness Hoarseness is any abnormal change in your voice.Hoarseness can make it difficult to speak. Your voice may sound raspy, breathy, or strained. Hoarseness is caused by a problem with the vocal cords. The vocal cords are two bands of tissue inside your voice box (larynx). When you speak, your vocal cords move back and forth to create sound. The surfaces of your vocal cords need to be smooth for your voice to sound clear. Swelling or lumps on the vocal cords can cause hoarseness. Common causes of vocal cord problems include:  Upper airway infection.  A long-term cough.  Straining or overusing your voice.  Smoking.  Allergies.  Vocal cord growths.  Stomach acids that flow up from your stomach and irritate your vocal cords (gastroesophageal reflux).  Follow these instructions at home: Watch your condition for any changes. To ease any discomfort that you feel:  Rest your voice. Do not whisper. Whispering can cause muscle strain.  Do not speak in a loud or harsh voice that makes your hoarseness worse.  Do not use any tobacco products, including cigarettes, chewing tobacco, or electronic cigarettes. If you need help quitting, ask your health care provider.  Avoid secondhand smoke.  Do not eat foods that give you heartburn. Heartburn can make gastroesophageal reflux worse.  Do not drink coffee.  Do not drink alcohol.  Drink enough fluids to keep your urine clear or pale yellow.  Use a humidifier if the air in your home is dry.  Contact a health care provider if:  You have hoarseness that lasts longer than 3 weeks.  You almost lose or completelylose your voice for longer than 3 days.  You have pain when you swallow or try to talk.  You feel a lump in your neck. Get help right away if:  You have trouble swallowing.  You feel as though you are choking when you swallow.  You cough up blood or vomit blood.  You have trouble breathing. This information is not  intended to replace advice given to you by your health care provider. Make sure you discuss any questions you have with your health care provider. Document Released: 05/29/2005 Document Revised: 11/21/2015 Document Reviewed: 06/06/2014 Elsevier Interactive Patient Education  2018 Elsevier Inc.  

## 2017-01-15 NOTE — Progress Notes (Signed)
BP (!) 158/72   Pulse 74   Temp (!) 96.9 F (36.1 C) (Oral)   Ht 5' 4"  (1.626 m)   Wt 131 lb (59.4 kg)   BMI 22.49 kg/m    Subjective:    Patient ID: Maria Alexander, female    DOB: [birth year], 83 y.o.   MRN: [id number]  HPI: Maria Alexander is a 83 y.o. female presenting on 01/15/2017 for Referral [facility] )  This patient comes in for periodic recheck on medications and conditions including chronic hoarseness. She has infrequent GERD at this time, no nocturnal symptoms. No fever or chills.  Had A recent episode of dysphasia related to eating meat. She does not do this very often. We have tried Claritin for the past month and had no improvement in the hoarseness. She can discontinue it at this time. She can keep the medication and use it as needed for seasonal allergies. I've asked her to try to keep track of any reflux symptoms that she might be having and how her voice performs. Remake an ear nose and throat appointment for her to have this evaluated  All medications are reviewed today. There are no reports of any problems with the medications. All of the medical conditions are reviewed and updated.  Lab work is reviewed and will be ordered as medically necessary. There are no new problems reported with today's visit.   Relevant past medical, surgical, family and social history reviewed and updated as indicated. Allergies and medications reviewed and updated.  Past Medical History:  Diagnosis Date  . Diabetes ([place])   . DVT (deep venous thrombosis) ([place])   . High cholesterol   . Hypothyroid   . Stroke [facility])     History reviewed. No pertinent surgical history.  Review of Systems  Constitutional: Negative.  Negative for activity change, fatigue and fever.  HENT: Positive for trouble swallowing and voice change. Negative for postnasal drip.   Eyes: Negative.   Respiratory: Negative.  Negative for cough.   Cardiovascular: Negative.  Negative for chest pain.  Gastrointestinal:  Negative.  Negative for abdominal distention, abdominal pain and vomiting.  Endocrine: Negative.   Genitourinary: Negative.  Negative for dysuria.  Musculoskeletal: Negative.   Skin: Negative.   Neurological: Negative.     Allergies as of 01/15/2017   No Known Allergies     Medication List       Accurate as of 01/15/17 11:39 AM. Always use your most recent med list.          ALPRAZolam 1 MG tablet Commonly known as:  XANAX Take 1 tablet (1 mg total) by mouth 2 (two) times daily as needed for anxiety.   atorvastatin 80 MG tablet Commonly known as:  LIPITOR Take 0.5 tablets (40 mg total) by mouth daily.   glipiZIDE 5 MG 24 hr tablet Commonly known as:  GLUCOTROL XL Take 2 tablets (10 mg total) by mouth daily with breakfast.   GOODY HEADACHE PO Take by mouth every morning.   levothyroxine 50 MCG tablet Commonly known as:  SYNTHROID, LEVOTHROID Take 1 tablet (50 mcg total) by mouth daily before breakfast.   loratadine 10 MG tablet Commonly known as:  CLARITIN Take 1 tablet (10 mg total) by mouth daily.   losartan 100 MG tablet Commonly known as:  COZAAR Take 1 tablet (100 mg total) by mouth daily.   multivitamin tablet Take 1 tablet by mouth daily.   silver sulfADIAZINE 1 % cream Commonly known as:  SILVADENE  Apply 1 application topically daily.          Objective:    BP (!) 158/72   Pulse 74   Temp (!) 96.9 F (36.1 C) (Oral)   Ht 5' 4"  (1.626 m)   Wt 131 lb (59.4 kg)   BMI 22.49 kg/m   No Known Allergies  Physical Exam  Constitutional: She is oriented to person, place, and time. She appears well-developed and well-nourished.  HENT:  Head: Normocephalic and atraumatic.  Right Ear: Tympanic membrane, external ear and ear canal normal.  Left Ear: Tympanic membrane, external ear and ear canal normal.  Nose: Nose normal. No rhinorrhea.  Mouth/Throat: Oropharynx is clear and moist and mucous membranes are normal. No oropharyngeal exudate or posterior  oropharyngeal erythema.  Eyes: Pupils are equal, round, and reactive to light. Conjunctivae and EOM are normal.  Neck: Normal range of motion. Neck supple.  Cardiovascular: Normal rate, regular rhythm, normal heart sounds and intact distal pulses.   Pulmonary/Chest: Effort normal and breath sounds normal.  Abdominal: Soft. Bowel sounds are normal.  Neurological: She is alert and oriented to person, place, and time. She has normal reflexes.  Skin: Skin is warm and dry. No rash noted.  Psychiatric: She has a normal mood and affect. Her behavior is normal. Judgment and thought content normal.  Nursing note and vitals reviewed.   Results for orders placed or performed in visit on 11/12/16  CMP14+EGFR  Result Value Ref Range   Glucose 166 (H) 65 - 99 mg/dL   BUN 16 10 - 36 mg/dL   Creatinine, Ser 0.88 0.57 - 1.00 mg/dL   GFR calc non Af Amer 58 (L) >59 mL/min/1.73   GFR calc Af Amer 67 >59 mL/min/1.73   BUN/Creatinine Ratio 18 12 - 28   Sodium 139 134 - 144 mmol/L   Potassium 4.7 3.5 - 5.2 mmol/L   Chloride 97 96 - 106 mmol/L   CO2 28 18 - 29 mmol/L   Calcium 10.0 8.7 - 10.3 mg/dL   Total Protein 6.3 6.0 - 8.5 g/dL   Albumin 4.2 3.2 - 4.6 g/dL   Globulin, Total 2.1 1.5 - 4.5 g/dL   Albumin/Globulin Ratio 2.0 1.2 - 2.2   Bilirubin Total 0.6 0.0 - 1.2 mg/dL   Alkaline Phosphatase 63 39 - 117 IU/L   AST 23 0 - 40 IU/L   ALT 21 0 - 32 IU/L  CBC with Differential/Platelet  Result Value Ref Range   WBC 5.6 3.4 - 10.8 x10E3/uL   RBC 4.00 3.77 - 5.28 x10E6/uL   Hemoglobin 12.4 11.1 - 15.9 g/dL   Hematocrit 38.0 34.0 - 46.6 %   MCV 95 79 - 97 fL   MCH 31.0 26.6 - 33.0 pg   MCHC 32.6 31.5 - 35.7 g/dL   RDW 15.9 (H) 12.3 - 15.4 %   Platelets 154 150 - 379 x10E3/uL   Neutrophils 68 Not Estab. %   Lymphs 23 Not Estab. %   Monocytes 8 Not Estab. %   Eos 1 Not Estab. %   Basos 0 Not Estab. %   Neutrophils Absolute 3.8 1.4 - 7.0 x10E3/uL   Lymphocytes Absolute 1.3 0.7 - 3.1 x10E3/uL    Monocytes Absolute 0.5 0.1 - 0.9 x10E3/uL   EOS (ABSOLUTE) 0.1 0.0 - 0.4 x10E3/uL   Basophils Absolute 0.0 0.0 - 0.2 x10E3/uL   Immature Granulocytes 0 Not Estab. %   Immature Grans (Abs) 0.0 0.0 - 0.1 x10E3/uL  Lipid panel  Result  Value Ref Range   Cholesterol, Total 181 100 - 199 mg/dL   Triglycerides 160 (H) 0 - 149 mg/dL   HDL 54 >39 mg/dL   VLDL Cholesterol Cal 32 5 - 40 mg/dL   LDL Calculated 95 0 - 99 mg/dL   Chol/HDL Ratio 3.4 0.0 - 4.4 ratio  TSH  Result Value Ref Range   TSH 1.330 0.450 - 4.500 uIU/mL  Bayer DCA Hb A1c Waived  Result Value Ref Range   Bayer DCA Hb A1c Waived 7.7 (H) <7.0 %      Assessment & Plan:   1. Chronic hoarseness - Ambulatory referral to ENT   Current Outpatient Prescriptions:  .  ALPRAZolam (XANAX) 1 MG tablet, Take 1 tablet (1 mg total) by mouth 2 (two) times daily as needed for anxiety., Disp: 60 tablet, Rfl: 0 .  Aspirin-Acetaminophen-Caffeine (GOODY HEADACHE PO), Take by mouth every morning., Disp: , Rfl:  .  atorvastatin (LIPITOR) 80 MG tablet, Take 0.5 tablets (40 mg total) by mouth daily., Disp: 30 tablet, Rfl: 11 .  glipiZIDE (GLUCOTROL XL) 5 MG 24 hr tablet, Take 2 tablets (10 mg total) by mouth daily with breakfast., Disp: 30 tablet, Rfl: 11 .  levothyroxine (SYNTHROID, LEVOTHROID) 50 MCG tablet, Take 1 tablet (50 mcg total) by mouth daily before breakfast., Disp: 30 tablet, Rfl: 11 .  loratadine (CLARITIN) 10 MG tablet, Take 1 tablet (10 mg total) by mouth daily., Disp: 30 tablet, Rfl: 11 .  losartan (COZAAR) 100 MG tablet, Take 1 tablet (100 mg total) by mouth daily., Disp: 30 tablet, Rfl: 11 .  Multiple Vitamin (MULTIVITAMIN) tablet, Take 1 tablet by mouth daily., Disp: , Rfl:  .  silver sulfADIAZINE (SILVADENE) 1 % cream, Apply 1 application topically daily. , Disp: , Rfl:   Continue all other maintenance medications as listed above.  Follow up plan: Return if symptoms worsen or fail to improve.  Educational handout given for  hoarseness  Terald Sleeper PA-C [place] [address]  [place], [place] [address] [phone]   01/15/2017, 11:39 AM

## 2017-01-25 ENCOUNTER — Telehealth: Payer: Self-pay

## 2017-01-25 DIAGNOSIS — M546 Pain in thoracic spine: Principal | ICD-10-CM

## 2017-01-25 DIAGNOSIS — G8929 Other chronic pain: Secondary | ICD-10-CM

## 2017-01-25 NOTE — Telephone Encounter (Signed)
Thought she was getting a referral for Ortho  For being stooped over

## 2017-02-03 DIAGNOSIS — M545 Low back pain: Secondary | ICD-10-CM | POA: Diagnosis not present

## 2017-02-25 ENCOUNTER — Ambulatory Visit (INDEPENDENT_AMBULATORY_CARE_PROVIDER_SITE_OTHER): Payer: Medicare Other | Admitting: Otolaryngology

## 2017-02-25 DIAGNOSIS — R490 Dysphonia: Secondary | ICD-10-CM | POA: Diagnosis not present

## 2017-02-25 DIAGNOSIS — J382 Nodules of vocal cords: Secondary | ICD-10-CM | POA: Diagnosis not present

## 2017-03-09 ENCOUNTER — Ambulatory Visit (INDEPENDENT_AMBULATORY_CARE_PROVIDER_SITE_OTHER): Payer: Medicare Other | Admitting: Physician Assistant

## 2017-03-09 ENCOUNTER — Encounter: Payer: Self-pay | Admitting: Physician Assistant

## 2017-03-09 ENCOUNTER — Telehealth: Payer: Self-pay | Admitting: Physician Assistant

## 2017-03-09 VITALS — BP 135/73 | HR 74 | Temp 98.1°F | Ht 64.0 in | Wt 130.0 lb

## 2017-03-09 DIAGNOSIS — H6121 Impacted cerumen, right ear: Secondary | ICD-10-CM | POA: Diagnosis not present

## 2017-03-09 NOTE — Progress Notes (Signed)
BP 135/73   Pulse 74   Temp 98.1 F (36.7 C) (Oral)   Ht 5\' 4"  (1.626 m)   Wt 130 lb (59 kg)   BMI 22.31 kg/m    Subjective:    Patient ID: Maria Alexander, female    DOB: 05-Jul-1923, 83 y.o.   MRN: [id number]  HPI: Maria Alexander is a 83 y.o. female presenting on 03/09/2017 for Right ear stopped up (ENT she saw said right ear was almost completely clogged up)    Relevant past medical, surgical, family and social history reviewed and updated as indicated. Allergies and medications reviewed and updated.  Past Medical History:  Diagnosis Date  . Diabetes ([place])   . DVT (deep venous thrombosis) ([place])   . High cholesterol   . Hypothyroid   . Stroke [facility])     History reviewed. No pertinent surgical history.  Review of Systems  Constitutional: Negative.   HENT: Positive for ear pain and hearing loss. Negative for ear discharge.   Eyes: Negative.   Respiratory: Negative.   Gastrointestinal: Negative.   Genitourinary: Negative.     Allergies as of 03/09/2017   No Known Allergies     Medication List       Accurate as of 03/09/17  9:52 AM. Always use your most recent med list.          ALPRAZolam 1 MG tablet Commonly known as:  XANAX Take 1 tablet (1 mg total) by mouth 2 (two) times daily as needed for anxiety.   atorvastatin 80 MG tablet Commonly known as:  LIPITOR Take 0.5 tablets (40 mg total) by mouth daily.   glipiZIDE 5 MG 24 hr tablet Commonly known as:  GLUCOTROL XL Take 2 tablets (10 mg total) by mouth daily with breakfast.   GOODY HEADACHE PO Take by mouth every morning.   levothyroxine 50 MCG tablet Commonly known as:  SYNTHROID, LEVOTHROID Take 1 tablet (50 mcg total) by mouth daily before breakfast.   loratadine 10 MG tablet Commonly known as:  CLARITIN Take 1 tablet (10 mg total) by mouth daily.   losartan 100 MG tablet Commonly known as:  COZAAR Take 1 tablet (100 mg total) by mouth daily.   multivitamin tablet Take 1 tablet by mouth  daily.   silver sulfADIAZINE 1 % cream Commonly known as:  SILVADENE Apply 1 application topically daily.          Objective:    BP 135/73   Pulse 74   Temp 98.1 F (36.7 C) (Oral)   Ht 5\' 4"  (1.626 m)   Wt 130 lb (59 kg)   BMI 22.31 kg/m   No Known Allergies  Physical Exam  Constitutional: She is oriented to person, place, and time. She appears well-developed and well-nourished.  HENT:  Head: Normocephalic and atraumatic.  Left Ear: Hearing, tympanic membrane, external ear and ear canal normal.  Right canal occluded with cerumen  Eyes: Pupils are equal, round, and reactive to light. Conjunctivae and EOM are normal.  Cardiovascular: Normal rate, regular rhythm, normal heart sounds and intact distal pulses.   Pulmonary/Chest: Effort normal and breath sounds normal.  Abdominal: Soft. Bowel sounds are normal.  Neurological: She is alert and oriented to person, place, and time. She has normal reflexes.  Skin: Skin is warm and dry. No rash noted.  Psychiatric: She has a normal mood and affect. Her behavior is normal. Judgment and thought content normal.        Assessment & Plan:  1. Impacted cerumen of right ear Nurse successfully irrigated cerumen from canal. Patient relieved   Current Outpatient Prescriptions:  .  ALPRAZolam (XANAX) 1 MG tablet, Take 1 tablet (1 mg total) by mouth 2 (two) times daily as needed for anxiety., Disp: 60 tablet, Rfl: 0 .  Aspirin-Acetaminophen-Caffeine (GOODY HEADACHE PO), Take by mouth every morning., Disp: , Rfl:  .  atorvastatin (LIPITOR) 80 MG tablet, Take 0.5 tablets (40 mg total) by mouth daily., Disp: 30 tablet, Rfl: 11 .  glipiZIDE (GLUCOTROL XL) 5 MG 24 hr tablet, Take 2 tablets (10 mg total) by mouth daily with breakfast., Disp: 30 tablet, Rfl: 11 .  levothyroxine (SYNTHROID, LEVOTHROID) 50 MCG tablet, Take 1 tablet (50 mcg total) by mouth daily before breakfast., Disp: 30 tablet, Rfl: 11 .  loratadine (CLARITIN) 10 MG tablet,  Take 1 tablet (10 mg total) by mouth daily., Disp: 30 tablet, Rfl: 11 .  losartan (COZAAR) 100 MG tablet, Take 1 tablet (100 mg total) by mouth daily., Disp: 30 tablet, Rfl: 11 .  Multiple Vitamin (MULTIVITAMIN) tablet, Take 1 tablet by mouth daily., Disp: , Rfl:  .  silver sulfADIAZINE (SILVADENE) 1 % cream, Apply 1 application topically daily. , Disp: , Rfl:  Continue all other maintenance medications as listed above.  Follow up plan: Follow-up as needed or worsening of symptoms. Call office for any issues.  Educational handout given for [place] PA-C [place] [address]  [place], [place] [address] [phone]   03/09/2017, 9:52 AM

## 2017-03-09 NOTE — Patient Instructions (Signed)
In a few days you may receive a survey in the mail or online from [employer] regarding your visit with us today. Please take a moment to fill this out. Your feedback is very important to our whole office. It can help us better understand your needs as well as improve your experience and satisfaction. Thank you for taking your time to complete it. We care about you.  Nori Winegar, PA-C  

## 2017-03-09 NOTE — Telephone Encounter (Signed)
Patient aware of recommendations.  

## 2017-03-09 NOTE — Telephone Encounter (Signed)
The water may need to dry up, give it another day.  Then if the hearing is still gone, recommend going back for hearing test now that the canal is open.

## 2017-03-22 ENCOUNTER — Ambulatory Visit (INDEPENDENT_AMBULATORY_CARE_PROVIDER_SITE_OTHER): Payer: Medicare Other | Admitting: *Deleted

## 2017-03-22 ENCOUNTER — Encounter: Payer: Self-pay | Admitting: *Deleted

## 2017-03-22 VITALS — BP 126/67 | Ht 60.0 in | Wt 130.0 lb

## 2017-03-22 DIAGNOSIS — Z0000 Encounter for general adult medical examination without abnormal findings: Secondary | ICD-10-CM

## 2017-03-22 DIAGNOSIS — Z23 Encounter for immunization: Secondary | ICD-10-CM

## 2017-03-22 NOTE — Progress Notes (Signed)
Subjective:   Maria Alexander is a 83 y.o. female who presents for an Initial Medicare Annual Wellness Visit.  Maria Alexander worked at [employer] office in [place] for 28 years before she retired.  She lives alone, but only 1 house away from her twin sister whom she sees daily.  She has 2 daughters, 1 lives in [place], [place] and the other in [place].  She has 1 son he is deceased.  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  She has no pets.  She has had no hospitalizations, emergency room visits, or surgeries in the past year.    Review of Systems    All systems negative today        Objective:    Today's Vitals   03/22/17 0849  BP: 126/67  Weight: 130 lb (59 kg)  Height: 5' (1.524 m)  PainSc: 0-No pain   Body mass index is 25.39 kg/m.   Current Medications (verified) Outpatient Encounter Prescriptions as of 03/22/2017  Medication Sig  . ALPRAZolam (XANAX) 1 MG tablet Take 1 tablet (1 mg total) by mouth 2 (two) times daily as needed for anxiety.  . Aspirin-Acetaminophen-Caffeine (GOODY HEADACHE PO) Take by mouth every morning.  Marland Kitchen atorvastatin (LIPITOR) 80 MG tablet Take 0.5 tablets (40 mg total) by mouth daily.  Marland Kitchen glipiZIDE (GLUCOTROL XL) 5 MG 24 hr tablet Take 2 tablets (10 mg total) by mouth daily with breakfast.  . levothyroxine (SYNTHROID, LEVOTHROID) 50 MCG tablet Take 1 tablet (50 mcg total) by mouth daily before breakfast.  . losartan (COZAAR) 100 MG tablet Take 1 tablet (100 mg total) by mouth daily.  . Multiple Vitamin (MULTIVITAMIN) tablet Take 1 tablet by mouth daily.  . silver sulfADIAZINE (SILVADENE) 1 % cream Apply 1 application topically daily.   Marland Kitchen loratadine (CLARITIN) 10 MG tablet Take 1 tablet (10 mg total) by mouth daily. (Patient not taking: Reported on 03/22/2017)   No facility-administered encounter medications on file as of 03/22/2017.     Allergies (verified) Shellfish allergy   History: Past Medical History:  Diagnosis Date   . Diabetes ([place])   . DVT (deep venous thrombosis) ([place])   . High cholesterol   . Hypothyroid   . Stroke [facility])    No past surgical history on file. Family History  Problem Relation Age of Onset  . Cancer Mother   . Heart disease Father        before age 68  . Heart attack Father   . Diabetes Sister   . Diabetes Brother    Social History   Occupational History  . Not on file.   Social History Main Topics  . Smoking status: Former Smoker    Types: Cigarettes    Quit date: 02/05/1995  . Smokeless tobacco: Never Used  . Alcohol use No  . Drug use: No  . Sexual activity: Not on file    Tobacco Counseling Counseling given: No   Activities of Daily Living In your present state of health, do you have any difficulty performing the following activities: 03/22/2017  Hearing? N  Vision? N  Difficulty concentrating or making decisions? N  Walking or climbing stairs? N  Dressing or bathing? N  Doing errands, shopping? N  Some recent data might be hidden    Immunizations and Health Maintenance Immunization History  Administered Date(s) Administered  . Influenza-Unspecified 04/07/2016  . Zoster Recombinat (Shingrix) 03/22/2017   Health Maintenance Due  Topic Date Due  .  FOOT EXAM  01/15/1936  . OPHTHALMOLOGY EXAM  01/15/1936  . TETANUS/TDAP  01/14/1945  . DEXA SCAN  01/15/1991  . PNA vac Low Risk Adult (1 of 2 - PCV13) 01/15/1991  . INFLUENZA VACCINE  01/27/2017   Foot exam- recommended at next visit with Particia Nearing, PA Patient declines tdap and dexa scan today.  She is scheduled to see Dr. Katy Fitch for her eye exam in October.  Asked patient if she would request that his office send [place] a copy of her note. Prevnar given 08/22/14 at [facility]- updated in [place] to get flu vaccine at a later date.   Patient Care Team: Theodoro Clock as PCP - General (General Practice)  Indicate any recent Medical Services you may have received from other than [facility]  providers in the past year (date may be approximate). Dr. Katy Fitch- eye exam    Assessment:   This is a routine wellness examination for [place].   Hearing/Vision screen Sees well with glasses No hearing deficit noted  Dietary issues and exercise activities discussed: Current Exercise Habits: The patient does not participate in regular exercise at present, Exercise limited by: None identified  Goals    . Eat more fruits and vegetables          Increase fruits and vegetable servings to at least 1 serving of each per day.      She eats 3 meals per day- usually a breakfast bar, then eggs, a fruitada, and juice a few hours later, and goes out to eat with her sister every evening for supper.  She states she does not always eat vegetables and fruit daily- encouraged her to try to increase intake to at least one serving of each per day.    Depression Screen PHQ 2/9 Scores 03/22/2017 03/09/2017 01/15/2017 11/12/2016  PHQ - 2 Score 0 0 0 0    Fall Risk Fall Risk  03/09/2017 01/15/2017 11/12/2016  Falls in the past year? No No No    Cognitive Function: MMSE - Mini Mental State Exam 03/22/2017  Orientation to time 5  Orientation to Place 5  Registration 3  Attention/ Calculation 5  Recall 3  Language- name 2 objects 2  Language- repeat 1  Language- follow 3 step command 3  Language- read & follow direction 1  Write a sentence 1  Copy design 1  Total score 30        Screening Tests Health Maintenance  Topic Date Due  . FOOT EXAM  01/15/1936  . OPHTHALMOLOGY EXAM  01/15/1936  . TETANUS/TDAP  01/14/1945  . DEXA SCAN  01/15/1991  . PNA vac Low Risk Adult (1 of 2 - PCV13) 01/15/1991  . INFLUENZA VACCINE  01/27/2017  . HEMOGLOBIN A1C  05/15/2017      Plan:      Return for 2nd Shingrix vaccine after 05/22/17. Please review Advanced Directives information, and if you decide to put them in place, please bring our office a copy to file on your chart Work on increasing servings of  fruits and vegetables daily- to at least one serving of each  I have personally reviewed and noted the following in the patient's chart:   . Medical and social history . Use of alcohol, tobacco or illicit drugs  . Current medications and supplements . Functional ability and status . Nutritional status . Physical activity . Advanced directives . List of other physicians . Hospitalizations, surgeries, and ER visits in previous 12 months . Vitals .  Screenings to include cognitive, depression, and falls . Referrals and appointments  In addition, I have reviewed and discussed with patient certain preventive protocols, quality metrics, and best practice recommendations. A written personalized care plan for preventive services as well as general preventive health recommendations were provided to patient.     WYATT, AMY M, RN   03/22/2017    I have reviewed and agree with the above AWV documentation.   Terald Sleeper PA-C [place] [address]  [place], [place] [address] [phone]

## 2017-03-22 NOTE — Patient Instructions (Addendum)
Thank you for coming in for your Annual Wellness Visit today!  You received your Shingrix vaccine today, and will need the 2nd dose after 05/22/17.   Please work on increasing your fruits and vegetable intake to at least one serving of each per day.   Please review the information given on Advanced Directives, and if you decide to put these in place please bring our our office a copy.

## 2017-03-23 ENCOUNTER — Encounter: Payer: Self-pay | Admitting: Physician Assistant

## 2017-03-23 ENCOUNTER — Ambulatory Visit (INDEPENDENT_AMBULATORY_CARE_PROVIDER_SITE_OTHER): Payer: Medicare Other | Admitting: Physician Assistant

## 2017-03-23 VITALS — BP 128/67 | HR 73 | Temp 97.0°F | Ht 60.0 in | Wt 131.0 lb

## 2017-03-23 DIAGNOSIS — H9191 Unspecified hearing loss, right ear: Secondary | ICD-10-CM

## 2017-03-23 NOTE — Progress Notes (Signed)
BP 128/67   Pulse 73   Temp (!) 97 F (36.1 C) (Oral)   Ht 5' (1.524 m)   Wt 131 lb (59.4 kg)   BMI 25.58 kg/m    Subjective:    Patient ID: Maria Alexander, female    DOB: [birth year], 83 y.o.   MRN: [id number]  HPI: Maria Alexander is a 83 y.o. female presenting on 03/23/2017 for toruble hearing (right ear no better patient states that she feels like it is getting worse )  Patient is still having decreased hearing in her right ear. She reports that the left ear is very good. When she is sitting in a meeting or room she is unable to hear out of her right ear altogether. She was seen by ear nose and throat doctor in [place] recently for her voice change. They have evaluated her for this problem. When they looked into her ear it was found that she had significant cerumen. This had been removed here in our office a few weeks ago. She did not regain any improved hearing afterwards. We will have her go back to ENT for evaluation of this and possible audiology evaluation.  Relevant past medical, surgical, family and social history reviewed and updated as indicated. Allergies and medications reviewed and updated.  Past Medical History:  Diagnosis Date  . Diabetes ([place])   . DVT (deep venous thrombosis) ([place])   . High cholesterol   . Hypothyroid   . Stroke [facility])     History reviewed. No pertinent surgical history.  Review of Systems  Constitutional: Negative.   HENT: Positive for hearing loss. Negative for congestion, ear discharge and ear pain.   Eyes: Negative.   Respiratory: Negative.   Gastrointestinal: Negative.   Genitourinary: Negative.     Allergies as of 03/23/2017      Reactions   Shellfish Allergy    hives      Medication List       Accurate as of 03/23/17  1:49 PM. Always use your most recent med list.          ALPRAZolam 1 MG tablet Commonly known as:  XANAX Take 1 tablet (1 mg total) by mouth 2 (two) times daily as needed for anxiety.   atorvastatin 80  MG tablet Commonly known as:  LIPITOR Take 0.5 tablets (40 mg total) by mouth daily.   glipiZIDE 5 MG 24 hr tablet Commonly known as:  GLUCOTROL XL Take 2 tablets (10 mg total) by mouth daily with breakfast.   GOODY HEADACHE PO Take by mouth every morning.   levothyroxine 50 MCG tablet Commonly known as:  SYNTHROID, LEVOTHROID Take 1 tablet (50 mcg total) by mouth daily before breakfast.   loratadine 10 MG tablet Commonly known as:  CLARITIN Take 1 tablet (10 mg total) by mouth daily.   losartan 100 MG tablet Commonly known as:  COZAAR Take 1 tablet (100 mg total) by mouth daily.   multivitamin tablet Take 1 tablet by mouth daily.   silver sulfADIAZINE 1 % cream Commonly known as:  SILVADENE Apply 1 application topically daily.            Discharge Care Instructions        Start     Ordered   03/23/17 0000  Ambulatory referral to ENT    Comments:  Already established, need hearing evaluated, as soon as possible. [place] office   03/23/17 0859         Objective:  BP 128/67   Pulse 73   Temp (!) 97 F (36.1 C) (Oral)   Ht 5' (1.524 m)   Wt 131 lb (59.4 kg)   BMI 25.58 kg/m   Allergies  Allergen Reactions  . Shellfish Allergy     hives    Physical Exam  Constitutional: She is oriented to person, place, and time. She appears well-developed and well-nourished.  HENT:  Head: Normocephalic and atraumatic.  Right Ear: Tympanic membrane, external ear and ear canal normal.  Left Ear: Tympanic membrane, external ear and ear canal normal.  Nose: Nose normal. No rhinorrhea.  Mouth/Throat: Oropharynx is clear and moist and mucous membranes are normal. No oropharyngeal exudate or posterior oropharyngeal erythema.  Eyes: Pupils are equal, round, and reactive to light. Conjunctivae and EOM are normal.  Neck: Normal range of motion. Neck supple.  Cardiovascular: Normal rate, regular rhythm, normal heart sounds and intact distal pulses.   Pulmonary/Chest:  Effort normal and breath sounds normal.  Abdominal: Soft. Bowel sounds are normal.  Neurological: She is alert and oriented to person, place, and time. She has normal reflexes.  Skin: Skin is warm and dry. No rash noted.  Psychiatric: She has a normal mood and affect. Her behavior is normal. Judgment and thought content normal.    Results for orders placed or performed in visit on 11/12/16  CMP14+EGFR  Result Value Ref Range   Glucose 166 (H) 65 - 99 mg/dL   BUN 16 10 - 36 mg/dL   Creatinine, Ser 0.88 0.57 - 1.00 mg/dL   GFR calc non Af Amer 58 (L) >59 mL/min/1.73   GFR calc Af Amer 67 >59 mL/min/1.73   BUN/Creatinine Ratio 18 12 - 28   Sodium 139 134 - 144 mmol/L   Potassium 4.7 3.5 - 5.2 mmol/L   Chloride 97 96 - 106 mmol/L   CO2 28 18 - 29 mmol/L   Calcium 10.0 8.7 - 10.3 mg/dL   Total Protein 6.3 6.0 - 8.5 g/dL   Albumin 4.2 3.2 - 4.6 g/dL   Globulin, Total 2.1 1.5 - 4.5 g/dL   Albumin/Globulin Ratio 2.0 1.2 - 2.2   Bilirubin Total 0.6 0.0 - 1.2 mg/dL   Alkaline Phosphatase 63 39 - 117 IU/L   AST 23 0 - 40 IU/L   ALT 21 0 - 32 IU/L  CBC with Differential/Platelet  Result Value Ref Range   WBC 5.6 3.4 - 10.8 x10E3/uL   RBC 4.00 3.77 - 5.28 x10E6/uL   Hemoglobin 12.4 11.1 - 15.9 g/dL   Hematocrit 38.0 34.0 - 46.6 %   MCV 95 79 - 97 fL   MCH 31.0 26.6 - 33.0 pg   MCHC 32.6 31.5 - 35.7 g/dL   RDW 15.9 (H) 12.3 - 15.4 %   Platelets 154 150 - 379 x10E3/uL   Neutrophils 68 Not Estab. %   Lymphs 23 Not Estab. %   Monocytes 8 Not Estab. %   Eos 1 Not Estab. %   Basos 0 Not Estab. %   Neutrophils Absolute 3.8 1.4 - 7.0 x10E3/uL   Lymphocytes Absolute 1.3 0.7 - 3.1 x10E3/uL   Monocytes Absolute 0.5 0.1 - 0.9 x10E3/uL   EOS (ABSOLUTE) 0.1 0.0 - 0.4 x10E3/uL   Basophils Absolute 0.0 0.0 - 0.2 x10E3/uL   Immature Granulocytes 0 Not Estab. %   Immature Grans (Abs) 0.0 0.0 - 0.1 x10E3/uL  Lipid panel  Result Value Ref Range   Cholesterol, Total 181 100 - 199 mg/dL  Triglycerides 160 (H) 0 - 149 mg/dL   HDL 54 >39 mg/dL   VLDL Cholesterol Cal 32 5 - 40 mg/dL   LDL Calculated 95 0 - 99 mg/dL   Chol/HDL Ratio 3.4 0.0 - 4.4 ratio  TSH  Result Value Ref Range   TSH 1.330 0.450 - 4.500 uIU/mL  Bayer DCA Hb A1c Waived  Result Value Ref Range   Bayer DCA Hb A1c Waived 7.7 (H) <7.0 %      Assessment & Plan:   1. Hearing loss of right ear, unspecified hearing loss type - Ambulatory referral to ENT    Current Outpatient Prescriptions:  .  ALPRAZolam (XANAX) 1 MG tablet, Take 1 tablet (1 mg total) by mouth 2 (two) times daily as needed for anxiety., Disp: 60 tablet, Rfl: 0 .  Aspirin-Acetaminophen-Caffeine (GOODY HEADACHE PO), Take by mouth every morning., Disp: , Rfl:  .  atorvastatin (LIPITOR) 80 MG tablet, Take 0.5 tablets (40 mg total) by mouth daily., Disp: 30 tablet, Rfl: 11 .  glipiZIDE (GLUCOTROL XL) 5 MG 24 hr tablet, Take 2 tablets (10 mg total) by mouth daily with breakfast., Disp: 30 tablet, Rfl: 11 .  levothyroxine (SYNTHROID, LEVOTHROID) 50 MCG tablet, Take 1 tablet (50 mcg total) by mouth daily before breakfast., Disp: 30 tablet, Rfl: 11 .  losartan (COZAAR) 100 MG tablet, Take 1 tablet (100 mg total) by mouth daily., Disp: 30 tablet, Rfl: 11 .  Multiple Vitamin (MULTIVITAMIN) tablet, Take 1 tablet by mouth daily., Disp: , Rfl:  .  silver sulfADIAZINE (SILVADENE) 1 % cream, Apply 1 application topically daily. , Disp: , Rfl:  .  loratadine (CLARITIN) 10 MG tablet, Take 1 tablet (10 mg total) by mouth daily. (Patient not taking: Reported on 03/22/2017), Disp: 30 tablet, Rfl: 11 Continue all other maintenance medications as listed above.  Follow up plan: Return if symptoms worsen or fail to improve.  Educational handout given for [place] PA-C [place] [address]  [place], [place] [address] [phone]   03/23/2017, 1:49 PM

## 2017-03-23 NOTE — Patient Instructions (Signed)
In a few days you may receive a survey in the mail or online from [employer] regarding your visit with us today. Please take a moment to fill this out. Your feedback is very important to our whole office. It can help us better understand your needs as well as improve your experience and satisfaction. Thank you for taking your time to complete it. We care about you.  Saleah Rishel, PA-C  

## 2017-03-31 ENCOUNTER — Other Ambulatory Visit: Payer: Self-pay | Admitting: Family Medicine

## 2017-03-31 NOTE — Telephone Encounter (Signed)
Phoned in.

## 2017-04-28 DIAGNOSIS — H04123 Dry eye syndrome of bilateral lacrimal glands: Secondary | ICD-10-CM | POA: Diagnosis not present

## 2017-04-28 DIAGNOSIS — H353131 Nonexudative age-related macular degeneration, bilateral, early dry stage: Secondary | ICD-10-CM | POA: Diagnosis not present

## 2017-04-28 DIAGNOSIS — E119 Type 2 diabetes mellitus without complications: Secondary | ICD-10-CM | POA: Diagnosis not present

## 2017-04-28 DIAGNOSIS — Z961 Presence of intraocular lens: Secondary | ICD-10-CM | POA: Diagnosis not present

## 2017-05-03 ENCOUNTER — Ambulatory Visit (INDEPENDENT_AMBULATORY_CARE_PROVIDER_SITE_OTHER): Payer: Medicare Other | Admitting: Otolaryngology

## 2017-05-03 DIAGNOSIS — H903 Sensorineural hearing loss, bilateral: Secondary | ICD-10-CM

## 2017-05-03 DIAGNOSIS — H9071 Mixed conductive and sensorineural hearing loss, unilateral, right ear, with unrestricted hearing on the contralateral side: Secondary | ICD-10-CM | POA: Diagnosis not present

## 2017-05-03 DIAGNOSIS — H6983 Other specified disorders of Eustachian tube, bilateral: Secondary | ICD-10-CM | POA: Diagnosis not present

## 2017-05-31 ENCOUNTER — Ambulatory Visit (INDEPENDENT_AMBULATORY_CARE_PROVIDER_SITE_OTHER): Payer: Medicare Other | Admitting: Otolaryngology

## 2017-05-31 DIAGNOSIS — H903 Sensorineural hearing loss, bilateral: Secondary | ICD-10-CM

## 2017-05-31 DIAGNOSIS — H6983 Other specified disorders of Eustachian tube, bilateral: Secondary | ICD-10-CM | POA: Diagnosis not present

## 2017-09-20 DIAGNOSIS — M9901 Segmental and somatic dysfunction of cervical region: Secondary | ICD-10-CM | POA: Diagnosis not present

## 2017-09-29 DIAGNOSIS — M50322 Other cervical disc degeneration at C5-C6 level: Secondary | ICD-10-CM | POA: Diagnosis not present

## 2017-09-29 DIAGNOSIS — M9901 Segmental and somatic dysfunction of cervical region: Secondary | ICD-10-CM | POA: Diagnosis not present

## 2017-10-04 DIAGNOSIS — M9901 Segmental and somatic dysfunction of cervical region: Secondary | ICD-10-CM | POA: Diagnosis not present

## 2017-10-04 DIAGNOSIS — M50322 Other cervical disc degeneration at C5-C6 level: Secondary | ICD-10-CM | POA: Diagnosis not present

## 2017-10-07 DIAGNOSIS — M9901 Segmental and somatic dysfunction of cervical region: Secondary | ICD-10-CM | POA: Diagnosis not present

## 2017-10-07 DIAGNOSIS — M50322 Other cervical disc degeneration at C5-C6 level: Secondary | ICD-10-CM | POA: Diagnosis not present

## 2017-10-11 DIAGNOSIS — M9901 Segmental and somatic dysfunction of cervical region: Secondary | ICD-10-CM | POA: Diagnosis not present

## 2017-10-11 DIAGNOSIS — M50322 Other cervical disc degeneration at C5-C6 level: Secondary | ICD-10-CM | POA: Diagnosis not present

## 2017-10-14 DIAGNOSIS — M50322 Other cervical disc degeneration at C5-C6 level: Secondary | ICD-10-CM | POA: Diagnosis not present

## 2017-10-14 DIAGNOSIS — M9901 Segmental and somatic dysfunction of cervical region: Secondary | ICD-10-CM | POA: Diagnosis not present

## 2017-10-18 DIAGNOSIS — M50322 Other cervical disc degeneration at C5-C6 level: Secondary | ICD-10-CM | POA: Diagnosis not present

## 2017-10-18 DIAGNOSIS — M9901 Segmental and somatic dysfunction of cervical region: Secondary | ICD-10-CM | POA: Diagnosis not present

## 2017-10-26 DIAGNOSIS — M9901 Segmental and somatic dysfunction of cervical region: Secondary | ICD-10-CM | POA: Diagnosis not present

## 2017-10-26 DIAGNOSIS — M50322 Other cervical disc degeneration at C5-C6 level: Secondary | ICD-10-CM | POA: Diagnosis not present

## 2017-11-04 DIAGNOSIS — M9901 Segmental and somatic dysfunction of cervical region: Secondary | ICD-10-CM | POA: Diagnosis not present

## 2017-11-04 DIAGNOSIS — M50322 Other cervical disc degeneration at C5-C6 level: Secondary | ICD-10-CM | POA: Diagnosis not present

## 2017-11-10 ENCOUNTER — Other Ambulatory Visit: Payer: Self-pay | Admitting: *Deleted

## 2017-11-10 DIAGNOSIS — E119 Type 2 diabetes mellitus without complications: Secondary | ICD-10-CM

## 2017-11-12 ENCOUNTER — Other Ambulatory Visit: Payer: Self-pay | Admitting: Physician Assistant

## 2017-11-25 ENCOUNTER — Ambulatory Visit (INDEPENDENT_AMBULATORY_CARE_PROVIDER_SITE_OTHER): Payer: Medicare Other | Admitting: Physician Assistant

## 2017-11-25 ENCOUNTER — Encounter: Payer: Self-pay | Admitting: Physician Assistant

## 2017-11-25 VITALS — BP 134/70 | HR 73 | Temp 97.1°F | Ht 60.0 in | Wt 131.2 lb

## 2017-11-25 DIAGNOSIS — F419 Anxiety disorder, unspecified: Secondary | ICD-10-CM

## 2017-11-25 DIAGNOSIS — E7800 Pure hypercholesterolemia, unspecified: Secondary | ICD-10-CM | POA: Diagnosis not present

## 2017-11-25 DIAGNOSIS — E039 Hypothyroidism, unspecified: Secondary | ICD-10-CM | POA: Diagnosis not present

## 2017-11-25 DIAGNOSIS — E0800 Diabetes mellitus due to underlying condition with hyperosmolarity without nonketotic hyperglycemic-hyperosmolar coma (NKHHC): Secondary | ICD-10-CM | POA: Diagnosis not present

## 2017-11-25 LAB — BAYER DCA HB A1C WAIVED: HB A1C (BAYER DCA - WAIVED): 8 % — ABNORMAL HIGH (ref <7.0)

## 2017-11-25 MED ORDER — ALPRAZOLAM 1 MG PO TABS
ORAL_TABLET | ORAL | 2 refills | Status: DC
Start: 2017-11-25 — End: 2017-11-25

## 2017-11-25 MED ORDER — ALPRAZOLAM 1 MG PO TABS: ORAL_TABLET | ORAL | 2 refills | Status: DC

## 2017-11-26 LAB — CMP14+EGFR
A/G RATIO: 2.3 — AB (ref 1.2–2.2)
ALT: 22 IU/L (ref 0–32)
AST: 23 IU/L (ref 0–40)
Albumin: 4.4 g/dL (ref 3.2–4.6)
Alkaline Phosphatase: 69 IU/L (ref 39–117)
BILIRUBIN TOTAL: 0.4 mg/dL (ref 0.0–1.2)
BUN/Creatinine Ratio: 22 (ref 12–28)
BUN: 19 mg/dL (ref 10–36)
CALCIUM: 9.9 mg/dL (ref 8.7–10.3)
CHLORIDE: 103 mmol/L (ref 96–106)
CO2: 23 mmol/L (ref 20–29)
Creatinine, Ser: 0.87 mg/dL (ref 0.57–1.00)
GFR calc Af Amer: 67 mL/min/{1.73_m2} (ref >59)
GFR calc non Af Amer: 58 mL/min/{1.73_m2} — ABNORMAL LOW (ref >59)
GLUCOSE: 124 mg/dL — AB (ref 65–99)
Globulin, Total: 1.9 g/dL (ref 1.5–4.5)
POTASSIUM: 5 mmol/L (ref 3.5–5.2)
Sodium: 140 mmol/L (ref 134–144)
Total Protein: 6.3 g/dL (ref 6.0–8.5)

## 2017-11-26 LAB — LIPID PANEL
Chol/HDL Ratio: 3.2 ratio (ref 0.0–4.4)
Cholesterol, Total: 184 mg/dL (ref 100–199)
HDL: 57 mg/dL (ref >39)
LDL Calculated: 93 mg/dL (ref 0–99)
TRIGLYCERIDES: 170 mg/dL — AB (ref 0–149)
VLDL Cholesterol Cal: 34 mg/dL (ref 5–40)

## 2017-11-26 LAB — CBC WITH DIFFERENTIAL/PLATELET
BASOS ABS: 0 10*3/uL (ref 0.0–0.2)
BASOS: 0 %
EOS (ABSOLUTE): 0.1 10*3/uL (ref 0.0–0.4)
Eos: 2 %
Hematocrit: 38.6 % (ref 34.0–46.6)
Hemoglobin: 13.1 g/dL (ref 11.1–15.9)
IMMATURE GRANULOCYTES: 0 %
Immature Grans (Abs): 0 10*3/uL (ref 0.0–0.1)
Lymphocytes Absolute: 1.5 10*3/uL (ref 0.7–3.1)
Lymphs: 26 %
MCH: 31 pg (ref 26.6–33.0)
MCHC: 33.9 g/dL (ref 31.5–35.7)
MCV: 91 fL (ref 79–97)
MONOS ABS: 0.5 10*3/uL (ref 0.1–0.9)
Monocytes: 8 %
NEUTROS PCT: 64 %
Neutrophils Absolute: 3.7 10*3/uL (ref 1.4–7.0)
PLATELETS: 164 10*3/uL (ref 150–450)
RBC: 4.23 x10E6/uL (ref 3.77–5.28)
RDW: 15 % (ref 12.3–15.4)
WBC: 5.8 10*3/uL (ref 3.4–10.8)

## 2017-11-26 LAB — TSH: TSH: 1.14 u[IU]/mL (ref 0.450–4.500)

## 2017-11-29 ENCOUNTER — Other Ambulatory Visit: Payer: Self-pay

## 2017-11-29 ENCOUNTER — Other Ambulatory Visit: Payer: Self-pay | Admitting: Physician Assistant

## 2017-11-29 DIAGNOSIS — E119 Type 2 diabetes mellitus without complications: Secondary | ICD-10-CM

## 2017-11-29 DIAGNOSIS — E7800 Pure hypercholesterolemia, unspecified: Secondary | ICD-10-CM

## 2017-11-29 DIAGNOSIS — F419 Anxiety disorder, unspecified: Secondary | ICD-10-CM | POA: Insufficient documentation

## 2017-11-29 MED ORDER — SITAGLIPTIN PHOSPHATE 50 MG PO TABS: 50.0000 mg | ORAL_TABLET | Freq: Every day | ORAL | 3 refills | Status: DC

## 2017-11-29 NOTE — Progress Notes (Signed)
BP 134/70   Pulse 73   Temp (!) 97.1 F (36.2 C) (Oral)   Ht 5' (1.524 m)   Wt 131 lb 3.2 oz (59.5 kg)   BMI 25.62 kg/m    Subjective:    Patient ID: Maria Alexander, female    DOB: 1924-03-23, 84 y.o.   MRN: [id number]  HPI: Maria Alexander is a 84 y.o. female presenting on 11/25/2017 for Medication Refill (alprazolam )  This patient comes in for recheck on her medications.  Her chronic medical conditions include hypothyroidism, diabetes, elevated cholesterol and anxiety.  Her medications are reviewed.  She does need refills on some and she does need labs performed.  She reports overall she is doing very well.  She is still very active and is not having any difficulty with her regular activities of daily living.  Past Medical History:  Diagnosis Date  . Diabetes ([place])   . DVT (deep venous thrombosis) ([place])   . High cholesterol   . Hypothyroid   . Stroke [facility])    Relevant past medical, surgical, family and social history reviewed and updated as indicated. Interim medical history since our last visit reviewed. Allergies and medications reviewed and updated. DATA REVIEWED: CHART IN EPIC  Family History reviewed for pertinent findings.  Review of Systems  Constitutional: Negative.  Negative for activity change, fatigue and fever.  HENT: Negative.   Eyes: Negative.   Respiratory: Negative.  Negative for cough.   Cardiovascular: Negative.  Negative for chest pain.  Gastrointestinal: Negative.  Negative for abdominal pain.  Endocrine: Negative.   Genitourinary: Negative.  Negative for dysuria.  Musculoskeletal: Negative.   Skin: Negative.   Neurological: Negative.     Allergies as of 11/25/2017      Reactions   Shellfish Allergy    hives      Medication List        Accurate as of 11/25/17 11:59 PM. Always use your most recent med list.          ALPRAZolam 1 MG tablet Commonly known as:  XANAX TAKE  (1)  TABLET TWICE A DAY.   atorvastatin 80 MG tablet Commonly  known as:  LIPITOR Take 0.5 tablets (40 mg total) by mouth daily.   glipiZIDE 5 MG 24 hr tablet Commonly known as:  GLUCOTROL XL Take 2 tablets (10 mg total) by mouth daily with breakfast.   GOODY HEADACHE PO Take by mouth every morning.   levothyroxine 50 MCG tablet Commonly known as:  SYNTHROID, LEVOTHROID Take 1 tablet (50 mcg total) by mouth daily before breakfast.   loratadine 10 MG tablet Commonly known as:  CLARITIN Take 1 tablet (10 mg total) by mouth daily.   losartan 100 MG tablet Commonly known as:  COZAAR Take 1 tablet (100 mg total) by mouth daily.   multivitamin tablet Take 1 tablet by mouth daily.          Objective:    BP 134/70   Pulse 73   Temp (!) 97.1 F (36.2 C) (Oral)   Ht 5' (1.524 m)   Wt 131 lb 3.2 oz (59.5 kg)   BMI 25.62 kg/m   Allergies  Allergen Reactions  . Shellfish Allergy     hives    Wt Readings from Last 3 Encounters:  11/25/17 131 lb 3.2 oz (59.5 kg)  03/23/17 131 lb (59.4 kg)  03/22/17 130 lb (59 kg)    Physical Exam  Constitutional: She is oriented to person, place, and  time. She appears well-developed and well-nourished.  HENT:  Head: Normocephalic and atraumatic.  Right Ear: Tympanic membrane, external ear and ear canal normal.  Left Ear: Tympanic membrane, external ear and ear canal normal.  Nose: Nose normal. No rhinorrhea.  Mouth/Throat: Oropharynx is clear and moist and mucous membranes are normal. No oropharyngeal exudate or posterior oropharyngeal erythema.  Eyes: Pupils are equal, round, and reactive to light. Conjunctivae and EOM are normal.  Neck: Normal range of motion. Neck supple.  Cardiovascular: Normal rate, regular rhythm, normal heart sounds and intact distal pulses.  Pulmonary/Chest: Effort normal and breath sounds normal.  Abdominal: Soft. Bowel sounds are normal.  Neurological: She is alert and oriented to person, place, and time. She has normal reflexes.  Skin: Skin is warm and dry. No rash  noted.  Psychiatric: She has a normal mood and affect. Her behavior is normal. Judgment and thought content normal.    Results for orders placed or performed in visit on 11/25/17  CBC with Differential/Platelet  Result Value Ref Range   WBC 5.8 3.4 - 10.8 x10E3/uL   RBC 4.23 3.77 - 5.28 x10E6/uL   Hemoglobin 13.1 11.1 - 15.9 g/dL   Hematocrit 38.6 34.0 - 46.6 %   MCV 91 79 - 97 fL   MCH 31.0 26.6 - 33.0 pg   MCHC 33.9 31.5 - 35.7 g/dL   RDW 15.0 12.3 - 15.4 %   Platelets 164 150 - 450 x10E3/uL   Neutrophils 64 Not Estab. %   Lymphs 26 Not Estab. %   Monocytes 8 Not Estab. %   Eos 2 Not Estab. %   Basos 0 Not Estab. %   Neutrophils Absolute 3.7 1.4 - 7.0 x10E3/uL   Lymphocytes Absolute 1.5 0.7 - 3.1 x10E3/uL   Monocytes Absolute 0.5 0.1 - 0.9 x10E3/uL   EOS (ABSOLUTE) 0.1 0.0 - 0.4 x10E3/uL   Basophils Absolute 0.0 0.0 - 0.2 x10E3/uL   Immature Granulocytes 0 Not Estab. %   Immature Grans (Abs) 0.0 0.0 - 0.1 x10E3/uL  CMP14+EGFR  Result Value Ref Range   Glucose 124 (H) 65 - 99 mg/dL   BUN 19 10 - 36 mg/dL   Creatinine, Ser 0.87 0.57 - 1.00 mg/dL   GFR calc non Af Amer 58 (L) >59 mL/min/1.73   GFR calc Af Amer 67 >59 mL/min/1.73   BUN/Creatinine Ratio 22 12 - 28   Sodium 140 134 - 144 mmol/L   Potassium 5.0 3.5 - 5.2 mmol/L   Chloride 103 96 - 106 mmol/L   CO2 23 20 - 29 mmol/L   Calcium 9.9 8.7 - 10.3 mg/dL   Total Protein 6.3 6.0 - 8.5 g/dL   Albumin 4.4 3.2 - 4.6 g/dL   Globulin, Total 1.9 1.5 - 4.5 g/dL   Albumin/Globulin Ratio 2.3 (H) 1.2 - 2.2   Bilirubin Total 0.4 0.0 - 1.2 mg/dL   Alkaline Phosphatase 69 39 - 117 IU/L   AST 23 0 - 40 IU/L   ALT 22 0 - 32 IU/L  Lipid panel  Result Value Ref Range   Cholesterol, Total 184 100 - 199 mg/dL   Triglycerides 170 (H) 0 - 149 mg/dL   HDL 57 >39 mg/dL   VLDL Cholesterol Cal 34 5 - 40 mg/dL   LDL Calculated 93 0 - 99 mg/dL   Chol/HDL Ratio 3.2 0.0 - 4.4 ratio  TSH  Result Value Ref Range   TSH 1.140 0.450 -  4.500 uIU/mL  Bayer DCA Hb A1c Waived  Result Value Ref Range   HB A1C (BAYER DCA - WAIVED) 8.0 (H) <7.0 %      Assessment & Plan:   1. Acquired hypothyroidism - TSH  2. Diabetes mellitus due to underlying condition with hyperosmolarity without coma, unspecified whether long term insulin use (HCC) - CBC with Differential/Platelet - CMP14+EGFR - Bayer DCA Hb A1c Waived  3. High cholesterol - CMP14+EGFR - Lipid panel  4. Anxiety - ALPRAZolam (XANAX) 1 MG tablet; TAKE  (1)  TABLET TWICE A DAY.  Dispense: 60 tablet; Refill: 2   Continue all other maintenance medications as listed above.  Follow up plan: No follow-ups on file.  Educational handout given for [place] PA-C [place] [address]  [place], [place] [address] [phone]   11/29/2017, 9:34 AM

## 2017-12-02 ENCOUNTER — Telehealth: Payer: Self-pay | Admitting: Physician Assistant

## 2017-12-02 NOTE — Telephone Encounter (Signed)
Reviewed lab results with note from Particia Nearing, PA, and patient's medication list.  Maria Alexander is to be added to current regimen.  Patient notified to take [name] in addition to Glipizide XL.

## 2017-12-13 ENCOUNTER — Other Ambulatory Visit: Payer: Self-pay | Admitting: Physician Assistant

## 2017-12-13 DIAGNOSIS — E039 Hypothyroidism, unspecified: Secondary | ICD-10-CM

## 2018-01-10 ENCOUNTER — Other Ambulatory Visit: Payer: Self-pay | Admitting: Physician Assistant

## 2018-01-21 ENCOUNTER — Other Ambulatory Visit: Payer: Self-pay

## 2018-01-21 DIAGNOSIS — E119 Type 2 diabetes mellitus without complications: Secondary | ICD-10-CM

## 2018-01-21 MED ORDER — GLIPIZIDE ER 5 MG PO TB24
ORAL_TABLET | ORAL | 0 refills | Status: DC
Start: 2018-01-21 — End: 2018-02-25

## 2018-02-25 ENCOUNTER — Encounter: Payer: Self-pay | Admitting: Physician Assistant

## 2018-02-25 ENCOUNTER — Ambulatory Visit (INDEPENDENT_AMBULATORY_CARE_PROVIDER_SITE_OTHER): Payer: Medicare Other | Admitting: Physician Assistant

## 2018-02-25 VITALS — BP 149/71 | HR 64 | Temp 98.2°F | Ht 60.0 in | Wt 133.4 lb

## 2018-02-25 DIAGNOSIS — E1169 Type 2 diabetes mellitus with other specified complication: Secondary | ICD-10-CM | POA: Diagnosis not present

## 2018-02-25 DIAGNOSIS — F419 Anxiety disorder, unspecified: Secondary | ICD-10-CM

## 2018-02-25 DIAGNOSIS — E7800 Pure hypercholesterolemia, unspecified: Secondary | ICD-10-CM | POA: Diagnosis not present

## 2018-02-25 DIAGNOSIS — E039 Hypothyroidism, unspecified: Secondary | ICD-10-CM | POA: Diagnosis not present

## 2018-02-25 LAB — BAYER DCA HB A1C WAIVED: HB A1C: 7 % — AB (ref <7.0)

## 2018-02-25 MED ORDER — LOSARTAN POTASSIUM 100 MG PO TABS: 100.0000 mg | ORAL_TABLET | Freq: Every day | ORAL | 3 refills | Status: DC

## 2018-02-25 MED ORDER — SITAGLIPTIN PHOSPHATE 50 MG PO TABS: 50.0000 mg | ORAL_TABLET | Freq: Every day | ORAL | 11 refills | Status: DC

## 2018-02-25 MED ORDER — GLIPIZIDE ER 5 MG PO TB24: ORAL_TABLET | ORAL | 3 refills | Status: DC

## 2018-02-25 MED ORDER — ALPRAZOLAM 1 MG PO TABS: ORAL_TABLET | ORAL | 5 refills | Status: DC

## 2018-02-26 LAB — CBC WITH DIFFERENTIAL/PLATELET
BASOS: 0 %
Basophils Absolute: 0 10*3/uL (ref 0.0–0.2)
EOS (ABSOLUTE): 0.1 10*3/uL (ref 0.0–0.4)
EOS: 2 %
HEMATOCRIT: 34.9 % (ref 34.0–46.6)
HEMOGLOBIN: 11.9 g/dL (ref 11.1–15.9)
IMMATURE GRANS (ABS): 0 10*3/uL (ref 0.0–0.1)
IMMATURE GRANULOCYTES: 0 %
LYMPHS: 21 %
Lymphocytes Absolute: 1.4 10*3/uL (ref 0.7–3.1)
MCH: 31.3 pg (ref 26.6–33.0)
MCHC: 34.1 g/dL (ref 31.5–35.7)
MCV: 92 fL (ref 79–97)
MONOCYTES: 9 %
Monocytes Absolute: 0.6 10*3/uL (ref 0.1–0.9)
NEUTROS ABS: 4.5 10*3/uL (ref 1.4–7.0)
NEUTROS PCT: 68 %
PLATELETS: 127 10*3/uL — AB (ref 150–450)
RBC: 3.8 x10E6/uL (ref 3.77–5.28)
RDW: 14.1 % (ref 12.3–15.4)
WBC: 6.7 10*3/uL (ref 3.4–10.8)

## 2018-02-26 LAB — CMP14+EGFR
ALBUMIN: 3.8 g/dL (ref 3.2–4.6)
ALT: 23 IU/L (ref 0–32)
AST: 21 IU/L (ref 0–40)
Albumin/Globulin Ratio: 2 (ref 1.2–2.2)
Alkaline Phosphatase: 62 IU/L (ref 39–117)
BILIRUBIN TOTAL: 0.4 mg/dL (ref 0.0–1.2)
BUN / CREAT RATIO: 13 (ref 12–28)
BUN: 12 mg/dL (ref 10–36)
CALCIUM: 9.3 mg/dL (ref 8.7–10.3)
CHLORIDE: 102 mmol/L (ref 96–106)
CO2: 24 mmol/L (ref 20–29)
Creatinine, Ser: 0.94 mg/dL (ref 0.57–1.00)
GFR calc Af Amer: 61 mL/min/{1.73_m2} (ref >59)
GFR calc non Af Amer: 53 mL/min/{1.73_m2} — ABNORMAL LOW (ref >59)
Globulin, Total: 1.9 g/dL (ref 1.5–4.5)
Glucose: 135 mg/dL — ABNORMAL HIGH (ref 65–99)
Potassium: 4.6 mmol/L (ref 3.5–5.2)
Sodium: 140 mmol/L (ref 134–144)
TOTAL PROTEIN: 5.7 g/dL — AB (ref 6.0–8.5)

## 2018-02-26 LAB — LIPID PANEL
CHOL/HDL RATIO: 3.5 ratio (ref 0.0–4.4)
Cholesterol, Total: 159 mg/dL (ref 100–199)
HDL: 46 mg/dL (ref >39)
LDL CALC: 83 mg/dL (ref 0–99)
Triglycerides: 150 mg/dL — ABNORMAL HIGH (ref 0–149)
VLDL CHOLESTEROL CAL: 30 mg/dL (ref 5–40)

## 2018-02-26 LAB — TSH: TSH: 2.98 u[IU]/mL (ref 0.450–4.500)

## 2018-02-28 NOTE — Progress Notes (Signed)
 BP (!) 149/71   Pulse 64   Temp 98.2 F (36.8 C) (Oral)   Ht 5' (1.524 m)   Wt 133 lb 6.4 oz (60.5 kg)   BMI 26.05 kg/m    Subjective:    Patient ID: Maria Alexander, female    DOB: 06/11/1924, 84 y.o.   MRN: [id number]  HPI: Barbarajean W Pfahler is a 84 y.o. female presenting on 02/25/2018 for Hypothyroidism; Diabetes; and Hyperlipidemia  This patient comes in for periodic recheck on medications and conditions including type 2 diabetes with hyperlipidemia, he ishypothyroidism and anxiety. She report iss no issues at thi time he had a formal  All medications are reviewed today. There are no reports of any problems with the medications. All of the medical conditions are reviewed and updated.  Lab work is reviewed and will be ordered as medically necessary. There are no new problems reported with today's visit.   Past Medical History:  Diagnosis Date  . Diabetes ([facility])   . DVT (deep venous thrombosis) ([facility])   . High cholesterol   . Hypothyroid   . Stroke ([facility])    Relevant past medical, surgical, family and social history reviewed and updated as indicated. Interim medical history since our last visit reviewed. Allergies and medications reviewed and updated. DATA REVIEWED: CHART IN EPIC  Family History reviewed for pertinent findings.  Review of Systems  Constitutional: Negative.  Negative for activity change, fatigue and fever.  HENT: Negative.   Eyes: Negative.   Respiratory: Negative.  Negative for cough.   Cardiovascular: Negative.  Negative for chest pain.  Gastrointestinal: Negative.  Negative for abdominal pain.  Endocrine: Negative.   Genitourinary: Negative.  Negative for dysuria.  Musculoskeletal: Negative.   Skin: Negative.   Neurological: Negative.     Allergies as of 02/25/2018      Reactions   Shellfish Allergy    hives      Medication List        Accurate as of 02/25/18 11:59 PM. Always use your most recent med list.          ALPRAZolam 1 MG  tablet Commonly known as:  XANAX TAKE  (1)  TABLET TWICE A DAY.   atorvastatin 80 MG tablet Commonly known as:  LIPITOR Take 1/2 tablet (40 mg total) by mouth daily.   glipiZIDE 5 MG 24 hr tablet Commonly known as:  GLUCOTROL XL Take 2 tablets (10 mg total) by mouth daily with breakfast.   GOODY HEADACHE PO Take by mouth every morning.   levothyroxine 50 MCG tablet Commonly known as:  SYNTHROID, LEVOTHROID Take 1 tablet (50 mcg total) by mouth daily before breakfast.   loratadine 10 MG tablet Commonly known as:  CLARITIN Take 1 tablet (10 mg total) by mouth daily.   losartan 100 MG tablet Commonly known as:  COZAAR Take 1 tablet (100 mg total) by mouth daily.   multivitamin tablet Take 1 tablet by mouth daily.   sitaGLIPtin 50 MG tablet Commonly known as:  [name] Take 1 tablet (50 mg total) by mouth daily.          Objective:    BP (!) 149/71   Pulse 64   Temp 98.2 F (36.8 C) (Oral)   Ht 5' (1.524 m)   Wt 133 lb 6.4 oz (60.5 kg)   BMI 26.05 kg/m   Allergies  Allergen Reactions  . Shellfish Allergy     hives    Wt Readings from Last 3 Encounters:    02/25/18 133 lb 6.4 oz (60.5 kg)  11/25/17 131 lb 3.2 oz (59.5 kg)  03/23/17 131 lb (59.4 kg)    Physical Exam  Constitutional: She is oriented to person, place, and time. She appears well-developed and well-nourished.  HENT:  Head: Normocephalic and atraumatic.  Eyes: Pupils are equal, round, and reactive to light. Conjunctivae and EOM are normal.  Cardiovascular: Normal rate, regular rhythm, normal heart sounds and intact distal pulses.  Pulmonary/Chest: Effort normal and breath sounds normal.  Abdominal: Soft. Bowel sounds are normal.  Neurological: She is alert and oriented to person, place, and time. She has normal reflexes.  Skin: Skin is warm and dry. No rash noted.  Psychiatric: She has a normal mood and affect. Her behavior is normal. Judgment and thought content normal.    Results for orders  placed or performed in visit on 02/25/18  CBC with Differential/Platelet  Result Value Ref Range   WBC 6.7 3.4 - 10.8 x10E3/uL   RBC 3.80 3.77 - 5.28 x10E6/uL   Hemoglobin 11.9 11.1 - 15.9 g/dL   Hematocrit 34.9 34.0 - 46.6 %   MCV 92 79 - 97 fL   MCH 31.3 26.6 - 33.0 pg   MCHC 34.1 31.5 - 35.7 g/dL   RDW 14.1 12.3 - 15.4 %   Platelets 127 (L) 150 - 450 x10E3/uL   Neutrophils 68 Not Estab. %   Lymphs 21 Not Estab. %   Monocytes 9 Not Estab. %   Eos 2 Not Estab. %   Basos 0 Not Estab. %   Neutrophils Absolute 4.5 1.4 - 7.0 x10E3/uL   Lymphocytes Absolute 1.4 0.7 - 3.1 x10E3/uL   Monocytes Absolute 0.6 0.1 - 0.9 x10E3/uL   EOS (ABSOLUTE) 0.1 0.0 - 0.4 x10E3/uL   Basophils Absolute 0.0 0.0 - 0.2 x10E3/uL   Immature Granulocytes 0 Not Estab. %   Immature Grans (Abs) 0.0 0.0 - 0.1 x10E3/uL  CMP14+EGFR  Result Value Ref Range   Glucose 135 (H) 65 - 99 mg/dL   BUN 12 10 - 36 mg/dL   Creatinine, Ser 0.94 0.57 - 1.00 mg/dL   GFR calc non Af Amer 53 (L) >59 mL/min/1.73   GFR calc Af Amer 61 >59 mL/min/1.73   BUN/Creatinine Ratio 13 12 - 28   Sodium 140 134 - 144 mmol/L   Potassium 4.6 3.5 - 5.2 mmol/L   Chloride 102 96 - 106 mmol/L   CO2 24 20 - 29 mmol/L   Calcium 9.3 8.7 - 10.3 mg/dL   Total Protein 5.7 (L) 6.0 - 8.5 g/dL   Albumin 3.8 3.2 - 4.6 g/dL   Globulin, Total 1.9 1.5 - 4.5 g/dL   Albumin/Globulin Ratio 2.0 1.2 - 2.2   Bilirubin Total 0.4 0.0 - 1.2 mg/dL   Alkaline Phosphatase 62 39 - 117 IU/L   AST 21 0 - 40 IU/L   ALT 23 0 - 32 IU/L  Lipid panel  Result Value Ref Range   Cholesterol, Total 159 100 - 199 mg/dL   Triglycerides 150 (H) 0 - 149 mg/dL   HDL 46 >39 mg/dL   VLDL Cholesterol Cal 30 5 - 40 mg/dL   LDL Calculated 83 0 - 99 mg/dL   Chol/HDL Ratio 3.5 0.0 - 4.4 ratio  TSH  Result Value Ref Range   TSH 2.980 0.450 - 4.500 uIU/mL  Bayer DCA Hb A1c Waived  Result Value Ref Range   HB A1C (BAYER DCA - WAIVED) 7.0 (H) <7.0 %        Assessment & Plan:    1. Type 2 diabetes mellitus with other specified complication, without long-term current use of insulin (HCC) - CBC with Differential/Platelet - CMP14+EGFR - Lipid panel - Bayer DCA Hb A1c Waived - sitaGLIPtin ([name]) 50 MG tablet; Take 1 tablet (50 mg total) by mouth daily.  Dispense: 30 tablet; Refill: 11 - glipiZIDE (GLIPIZIDE XL) 5 MG 24 hr tablet; Take 2 tablets (10 mg total) by mouth daily with breakfast.  Dispense: 180 tablet; Refill: 3  2. High cholesterol - CBC with Differential/Platelet - Lipid panel  3. Acquired hypothyroidism - TSH  4. Anxiety - ALPRAZolam (XANAX) 1 MG tablet; TAKE  (1)  TABLET TWICE A DAY.  Dispense: 60 tablet; Refill: 5   Continue all other maintenance medications as listed above.  Follow up plan: Return in about 6 months (around 08/27/2018).  Educational handout given for survey  Angel S. Jones PA-C [facility] [address]  [place], [place] [address] [phone]   02/28/2018, 6:52 PM  

## 2018-03-02 ENCOUNTER — Encounter: Payer: Self-pay | Admitting: *Deleted

## 2018-03-16 DIAGNOSIS — D225 Melanocytic nevi of trunk: Secondary | ICD-10-CM | POA: Diagnosis not present

## 2018-03-16 DIAGNOSIS — C44622 Squamous cell carcinoma of skin of right upper limb, including shoulder: Secondary | ICD-10-CM | POA: Diagnosis not present

## 2018-03-16 DIAGNOSIS — L570 Actinic keratosis: Secondary | ICD-10-CM | POA: Diagnosis not present

## 2018-03-16 DIAGNOSIS — X32XXXD Exposure to sunlight, subsequent encounter: Secondary | ICD-10-CM | POA: Diagnosis not present

## 2018-04-04 ENCOUNTER — Other Ambulatory Visit: Payer: Self-pay

## 2018-04-04 NOTE — Patient Outreach (Signed)
[facility]) Care Management  04/04/2018  Maria Alexander [birth year] [id number]   Medication Adherence call to [name] patient telephone number is disconnected patient is due on [name] 50 mg under [name].   [name] Management Direct Dial [phone]  Fax [phone] [email or web address]

## 2018-04-28 DIAGNOSIS — E119 Type 2 diabetes mellitus without complications: Secondary | ICD-10-CM | POA: Diagnosis not present

## 2018-04-28 DIAGNOSIS — H353131 Nonexudative age-related macular degeneration, bilateral, early dry stage: Secondary | ICD-10-CM | POA: Diagnosis not present

## 2018-04-28 DIAGNOSIS — H04123 Dry eye syndrome of bilateral lacrimal glands: Secondary | ICD-10-CM | POA: Diagnosis not present

## 2018-04-28 DIAGNOSIS — Z961 Presence of intraocular lens: Secondary | ICD-10-CM | POA: Diagnosis not present

## 2018-06-08 ENCOUNTER — Ambulatory Visit (INDEPENDENT_AMBULATORY_CARE_PROVIDER_SITE_OTHER): Payer: Medicare Other | Admitting: Physician Assistant

## 2018-06-08 ENCOUNTER — Encounter: Payer: Self-pay | Admitting: Physician Assistant

## 2018-06-08 VITALS — BP 129/71 | HR 66 | Temp 96.8°F | Ht 60.0 in | Wt 132.0 lb

## 2018-06-08 DIAGNOSIS — E1169 Type 2 diabetes mellitus with other specified complication: Secondary | ICD-10-CM | POA: Diagnosis not present

## 2018-06-08 DIAGNOSIS — F419 Anxiety disorder, unspecified: Secondary | ICD-10-CM

## 2018-06-08 DIAGNOSIS — E7800 Pure hypercholesterolemia, unspecified: Secondary | ICD-10-CM | POA: Diagnosis not present

## 2018-06-08 DIAGNOSIS — J0100 Acute maxillary sinusitis, unspecified: Secondary | ICD-10-CM | POA: Diagnosis not present

## 2018-06-08 MED ORDER — ALPRAZOLAM 1 MG PO TABS: ORAL_TABLET | ORAL | 5 refills | Status: DC

## 2018-06-08 MED ORDER — SITAGLIPTIN PHOSPHATE 50 MG PO TABS: 50.0000 mg | ORAL_TABLET | Freq: Every day | ORAL | 3 refills | Status: DC

## 2018-06-08 MED ORDER — ATORVASTATIN CALCIUM 80 MG PO TABS: ORAL_TABLET | ORAL | 5 refills | Status: DC

## 2018-06-08 MED ORDER — AMOXICILLIN 500 MG PO CAPS: 500.0000 mg | ORAL_CAPSULE | Freq: Three times a day (TID) | ORAL | 0 refills | Status: DC

## 2018-06-08 NOTE — Progress Notes (Signed)
BP 129/71   Pulse 66   Temp (!) 96.8 F (36 C) (Oral)   Ht 5' (1.524 m)   Wt 132 lb (59.9 kg)   BMI 25.78 kg/m    Subjective:    Patient ID: Maria Alexander, female    DOB: Aug 29, 1923, 84 y.o.   MRN: [id number]  HPI: Maria Alexander is a 84 y.o. female presenting on 06/08/2018 for Diabetes (3 month follow up); Hypothyroidism; and Cough  This patient comes in for periodic recheck on medications and conditions including type 2 diabetes that is well controlled, high cholesterol, anxiety.  She also complains of significant sinus pressure and drainage.  She has had this for about 2 weeks now.  She knew she was coming to the doctor and has just waited to come in to be seen.  There is copious drainage at this time she has even blood.  She denies any cough or wheeze.  No nausea vomiting or diarrhea..   All medications are reviewed today. There are no reports of any problems with the medications. All of the medical conditions are reviewed and updated.  Lab work is reviewed and will be ordered as medically necessary. There are no new problems reported with today's visit.   Past Medical History:  Diagnosis Date  . Diabetes ([place])   . DVT (deep venous thrombosis) ([place])   . High cholesterol   . Hypothyroid   . Stroke [facility])    Relevant past medical, surgical, family and social history reviewed and updated as indicated. Interim medical history since our last visit reviewed. Allergies and medications reviewed and updated. DATA REVIEWED: CHART IN EPIC  Family History reviewed for pertinent findings.  Review of Systems  Constitutional: Positive for fatigue. Negative for activity change, appetite change, chills and fever.  HENT: Positive for congestion, postnasal drip and sore throat.   Eyes: Negative.   Respiratory: Positive for cough. Negative for wheezing.   Cardiovascular: Negative.  Negative for chest pain, palpitations and leg swelling.  Gastrointestinal: Negative.   Genitourinary:  Negative.   Musculoskeletal: Negative.   Skin: Negative.   Neurological: Positive for headaches.    Allergies as of 06/08/2018      Reactions   Shellfish Allergy    hives      Medication List        Accurate as of 06/08/18  1:46 PM. Always use your most recent med list.          ALPRAZolam 1 MG tablet Commonly known as:  XANAX TAKE  (1)  TABLET TWICE A DAY.   amoxicillin 500 MG capsule Commonly known as:  AMOXIL Take 1 capsule (500 mg total) by mouth 3 (three) times daily.   atorvastatin 80 MG tablet Commonly known as:  LIPITOR Take 1/2 tablet (40 mg total) by mouth daily.   glipiZIDE 5 MG 24 hr tablet Commonly known as:  GLUCOTROL XL Take 2 tablets (10 mg total) by mouth daily with breakfast.   GOODY HEADACHE PO Take by mouth every morning.   levothyroxine 50 MCG tablet Commonly known as:  SYNTHROID, LEVOTHROID Take 1 tablet (50 mcg total) by mouth daily before breakfast.   loratadine 10 MG tablet Commonly known as:  CLARITIN Take 1 tablet (10 mg total) by mouth daily.   losartan 100 MG tablet Commonly known as:  COZAAR Take 1 tablet (100 mg total) by mouth daily.   multivitamin tablet Take 1 tablet by mouth daily.   sitaGLIPtin 50 MG tablet Commonly known  as:  [name] Take 1 tablet (50 mg total) by mouth daily.          Objective:    BP 129/71   Pulse 66   Temp (!) 96.8 F (36 C) (Oral)   Ht 5' (1.524 m)   Wt 132 lb (59.9 kg)   BMI 25.78 kg/m   Allergies  Allergen Reactions  . Shellfish Allergy     hives    Wt Readings from Last 3 Encounters:  06/08/18 132 lb (59.9 kg)  02/25/18 133 lb 6.4 oz (60.5 kg)  11/25/17 131 lb 3.2 oz (59.5 kg)    Physical Exam  Constitutional: She is oriented to person, place, and time. She appears well-developed and well-nourished.  HENT:  Head: Normocephalic and atraumatic.  Right Ear: A middle ear effusion is present.  Left Ear: A middle ear effusion is present.  Nose: Mucosal edema present. Right  sinus exhibits no frontal sinus tenderness. Left sinus exhibits no frontal sinus tenderness.  Mouth/Throat: Posterior oropharyngeal erythema present. No oropharyngeal exudate or tonsillar abscesses.  Eyes: Pupils are equal, round, and reactive to light. Conjunctivae and EOM are normal.  Neck: Normal range of motion.  Cardiovascular: Normal rate, regular rhythm, normal heart sounds and intact distal pulses.  Pulmonary/Chest: Effort normal and breath sounds normal.  Abdominal: Soft. Bowel sounds are normal.  Neurological: She is alert and oriented to person, place, and time. She has normal reflexes.  Skin: Skin is warm and dry. No rash noted.  Psychiatric: She has a normal mood and affect. Her behavior is normal. Judgment and thought content normal.  Nursing note and vitals reviewed.   Results for orders placed or performed in visit on 02/25/18  CBC with Differential/Platelet  Result Value Ref Range   WBC 6.7 3.4 - 10.8 x10E3/uL   RBC 3.80 3.77 - 5.28 x10E6/uL   Hemoglobin 11.9 11.1 - 15.9 g/dL   Hematocrit 34.9 34.0 - 46.6 %   MCV 92 79 - 97 fL   MCH 31.3 26.6 - 33.0 pg   MCHC 34.1 31.5 - 35.7 g/dL   RDW 14.1 12.3 - 15.4 %   Platelets 127 (L) 150 - 450 x10E3/uL   Neutrophils 68 Not Estab. %   Lymphs 21 Not Estab. %   Monocytes 9 Not Estab. %   Eos 2 Not Estab. %   Basos 0 Not Estab. %   Neutrophils Absolute 4.5 1.4 - 7.0 x10E3/uL   Lymphocytes Absolute 1.4 0.7 - 3.1 x10E3/uL   Monocytes Absolute 0.6 0.1 - 0.9 x10E3/uL   EOS (ABSOLUTE) 0.1 0.0 - 0.4 x10E3/uL   Basophils Absolute 0.0 0.0 - 0.2 x10E3/uL   Immature Granulocytes 0 Not Estab. %   Immature Grans (Abs) 0.0 0.0 - 0.1 x10E3/uL  CMP14+EGFR  Result Value Ref Range   Glucose 135 (H) 65 - 99 mg/dL   BUN 12 10 - 36 mg/dL   Creatinine, Ser 0.94 0.57 - 1.00 mg/dL   GFR calc non Af Amer 53 (L) >59 mL/min/1.73   GFR calc Af Amer 61 >59 mL/min/1.73   BUN/Creatinine Ratio 13 12 - 28   Sodium 140 134 - 144 mmol/L   Potassium  4.6 3.5 - 5.2 mmol/L   Chloride 102 96 - 106 mmol/L   CO2 24 20 - 29 mmol/L   Calcium 9.3 8.7 - 10.3 mg/dL   Total Protein 5.7 (L) 6.0 - 8.5 g/dL   Albumin 3.8 3.2 - 4.6 g/dL   Globulin, Total 1.9 1.5 - 4.5  g/dL   Albumin/Globulin Ratio 2.0 1.2 - 2.2   Bilirubin Total 0.4 0.0 - 1.2 mg/dL   Alkaline Phosphatase 62 39 - 117 IU/L   AST 21 0 - 40 IU/L   ALT 23 0 - 32 IU/L  Lipid panel  Result Value Ref Range   Cholesterol, Total 159 100 - 199 mg/dL   Triglycerides 150 (H) 0 - 149 mg/dL   HDL 46 >39 mg/dL   VLDL Cholesterol Cal 30 5 - 40 mg/dL   LDL Calculated 83 0 - 99 mg/dL   Chol/HDL Ratio 3.5 0.0 - 4.4 ratio  TSH  Result Value Ref Range   TSH 2.980 0.450 - 4.500 uIU/mL  Bayer DCA Hb A1c Waived  Result Value Ref Range   HB A1C (BAYER DCA - WAIVED) 7.0 (H) <7.0 %      Assessment & Plan:   1. Type 2 diabetes mellitus with other specified complication, without long-term current use of insulin (HCC) - sitaGLIPtin ([name]) 50 MG tablet; Take 1 tablet (50 mg total) by mouth daily.  Dispense: 90 tablet; Refill: 3  2. High cholesterol - atorvastatin (LIPITOR) 80 MG tablet; Take 1/2 tablet (40 mg total) by mouth daily.  Dispense: 30 tablet; Refill: 5  3. Anxiety - ALPRAZolam (XANAX) 1 MG tablet; TAKE  (1)  TABLET TWICE A DAY.  Dispense: 60 tablet; Refill: 5  4. Acute non-recurrent maxillary sinusitis - amoxicillin (AMOXIL) 500 MG capsule; Take 1 capsule (500 mg total) by mouth 3 (three) times daily.  Dispense: 30 capsule; Refill: 0   Continue all other maintenance medications as listed above.  Follow up plan: Return in about 6 months (around 12/08/2018) for recheck and labs.  Educational handout given for [place] PA-C [place] [address]  [place], [place] [address] [phone]   06/08/2018, 1:46 PM

## 2018-07-10 ENCOUNTER — Emergency Department (HOSPITAL_COMMUNITY)
Admission: EM | Admit: 2018-07-10 | Discharge: 2018-07-10 | Disposition: A | Discharge status: Home / Self Care | Payer: Medicare Other | Attending: Emergency Medicine | Admitting: Emergency Medicine

## 2018-07-10 ENCOUNTER — Other Ambulatory Visit: Payer: Self-pay

## 2018-07-10 ENCOUNTER — Emergency Department (HOSPITAL_COMMUNITY): Payer: Medicare Other

## 2018-07-10 ENCOUNTER — Encounter (HOSPITAL_COMMUNITY): Payer: Self-pay | Admitting: *Deleted

## 2018-07-10 DIAGNOSIS — Z7984 Long term (current) use of oral hypoglycemic drugs: Secondary | ICD-10-CM | POA: Diagnosis not present

## 2018-07-10 DIAGNOSIS — Z7982 Long term (current) use of aspirin: Secondary | ICD-10-CM | POA: Diagnosis not present

## 2018-07-10 DIAGNOSIS — M79662 Pain in left lower leg: Secondary | ICD-10-CM | POA: Diagnosis not present

## 2018-07-10 DIAGNOSIS — E119 Type 2 diabetes mellitus without complications: Secondary | ICD-10-CM | POA: Insufficient documentation

## 2018-07-10 DIAGNOSIS — Z8673 Personal history of transient ischemic attack (TIA), and cerebral infarction without residual deficits: Secondary | ICD-10-CM | POA: Diagnosis not present

## 2018-07-10 DIAGNOSIS — E039 Hypothyroidism, unspecified: Secondary | ICD-10-CM | POA: Insufficient documentation

## 2018-07-10 DIAGNOSIS — M1712 Unilateral primary osteoarthritis, left knee: Secondary | ICD-10-CM | POA: Diagnosis not present

## 2018-07-10 DIAGNOSIS — Z87891 Personal history of nicotine dependence: Secondary | ICD-10-CM | POA: Insufficient documentation

## 2018-07-10 DIAGNOSIS — Z79899 Other long term (current) drug therapy: Secondary | ICD-10-CM | POA: Diagnosis not present

## 2018-07-10 DIAGNOSIS — F419 Anxiety disorder, unspecified: Secondary | ICD-10-CM | POA: Diagnosis not present

## 2018-07-10 DIAGNOSIS — M79605 Pain in left leg: Secondary | ICD-10-CM

## 2018-07-10 LAB — I-STAT CHEM 8, ED
BUN: 16 mg/dL (ref 8–23)
CREATININE: 0.8 mg/dL (ref 0.44–1.00)
Calcium, Ion: 1.24 mmol/L (ref 1.15–1.40)
Chloride: 101 mmol/L (ref 98–111)
Glucose, Bld: 208 mg/dL — ABNORMAL HIGH (ref 70–99)
HCT: 34 % — ABNORMAL LOW (ref 36.0–46.0)
HEMOGLOBIN: 11.6 g/dL — AB (ref 12.0–15.0)
POTASSIUM: 3.8 mmol/L (ref 3.5–5.1)
Sodium: 136 mmol/L (ref 135–145)
TCO2: 26 mmol/L (ref 22–32)

## 2018-07-10 MED ORDER — RIVAROXABAN 15 MG PO TABS
15.0000 mg | ORAL_TABLET | Freq: Once | ORAL | Status: AC
  Administered 2018-07-10: 15 mg via ORAL
  Filled 2018-07-10 (×2): qty 1

## 2018-07-10 NOTE — ED Triage Notes (Signed)
Pt c/o pain behind left knee with walking that started today, denies any injury,

## 2018-07-10 NOTE — ED Notes (Signed)
Xray in room for portable of knee

## 2018-07-10 NOTE — ED Provider Notes (Signed)
[facility] EMERGENCY DEPARTMENT Provider Note   CSN: [id number] Arrival date & time: 07/10/18  1908     History   Chief Complaint Chief Complaint  Patient presents with  . Leg Pain    HPI Maria Alexander is a 85 y.o. female with a history of well controlled DM, cva without sequalae and a dvt in her left lower extremity approx 20 years ago thought to be triggered by HRT therapy presenting with pain in her left upper calf radiating into her posterior knee space in association with erythema which has improved since using elevation and warm compresses prior to arrival. Her symptoms began today. She denies any injury to the extremity and also denies cp, sob, palpitations, fevers, chills, numbness or weakness in the extremity.  The history is provided by the patient.    Past Medical History:  Diagnosis Date  . Diabetes ([place])   . DVT (deep venous thrombosis) ([place])   . High cholesterol   . Hypothyroid   . Stroke [facility])     Patient Active Problem List   Diagnosis Date Noted  . Anxiety 11/29/2017  . Chronic hoarseness 01/15/2017  . Hypothyroidism 06/06/2014  . High cholesterol 06/06/2014  . Diabetes ([place]) 06/06/2014    History reviewed. No pertinent surgical history.   OB History   No obstetric history on file.      Home Medications    Prior to Admission medications   Medication Sig Start Date End Date Taking? Authorizing Provider  ALPRAZolam (XANAX) 1 MG tablet TAKE  (1)  TABLET TWICE A DAY. 06/08/18   Terald Sleeper, PA-C  amoxicillin (AMOXIL) 500 MG capsule Take 1 capsule (500 mg total) by mouth 3 (three) times daily. 06/08/18   Terald Sleeper, PA-C  Aspirin-Acetaminophen-Caffeine (GOODY HEADACHE PO) Take by mouth every morning.    [provider]  atorvastatin (LIPITOR) 80 MG tablet Take 1/2 tablet (40 mg total) by mouth daily. 06/08/18   Terald Sleeper, PA-C  glipiZIDE (GLIPIZIDE XL) 5 MG 24 hr tablet Take 2 tablets (10 mg total) by mouth daily with  breakfast. 02/25/18   Terald Sleeper, PA-C  levothyroxine (SYNTHROID, LEVOTHROID) 50 MCG tablet Take 1 tablet (50 mcg total) by mouth daily before breakfast. 12/13/17   Terald Sleeper, PA-C  loratadine (CLARITIN) 10 MG tablet Take 1 tablet (10 mg total) by mouth daily. 11/29/17   Terald Sleeper, PA-C  losartan (COZAAR) 100 MG tablet Take 1 tablet (100 mg total) by mouth daily. 02/25/18   Terald Sleeper, PA-C  Multiple Vitamin (MULTIVITAMIN) tablet Take 1 tablet by mouth daily.    [provider]  sitaGLIPtin ([name]) 50 MG tablet Take 1 tablet (50 mg total) by mouth daily. 06/08/18   Terald Sleeper, PA-C    Family History Family History  Problem Relation Age of Onset  . Cancer Mother   . Heart disease Father        before age 42  . Heart attack Father   . Diabetes Sister   . Diabetes Brother     Social History Social History   Tobacco Use  . Smoking status: Former Smoker    Types: Cigarettes    Last attempt to quit: 02/05/1995    Years since quitting: 23.4  . Smokeless tobacco: Never Used  Substance Use Topics  . Alcohol use: No    Alcohol/week: 0.0 standard drinks  . Drug use: No     Allergies   Shellfish allergy  Review of Systems Review of Systems  Constitutional: Negative for fever.  Musculoskeletal: Positive for arthralgias. Negative for joint swelling and myalgias.  Skin: Positive for color change.  Neurological: Negative for weakness and numbness.     Physical Exam Updated Vital Signs BP 119/64 (BP Location: Right Arm)   Pulse 78   Temp 97.9 F (36.6 C) (Tympanic)   Resp 14   Ht 5' (1.524 m)   Wt 59 kg   SpO2 94%   BMI 25.39 kg/m   Physical Exam Constitutional:      Appearance: She is well-developed.  HENT:     Head: Atraumatic.  Neck:     Musculoskeletal: Normal range of motion.  Cardiovascular:     Comments: Pulses equal bilaterally Musculoskeletal:        General: Tenderness present.  Skin:    General: Skin is warm and dry.    Neurological:     Mental Status: She is alert.     Sensory: No sensory deficit.      ED Treatments / Results  Labs (all labs ordered are listed, but only abnormal results are displayed) Labs Reviewed  I-STAT CHEM 8, ED - Abnormal; Notable for the following components:      Result Value   Glucose, Bld 208 (*)    Hemoglobin 11.6 (*)    HCT 34.0 (*)    All other components within normal limits    EKG None  Radiology Dg Knee Complete 4 Views Left  Result Date: 07/10/2018 CLINICAL DATA:  85 year old with POSTERIOR LEFT knee pain that began today while walking. No specific injury. EXAM: LEFT KNEE - COMPLETE 4+ VIEW COMPARISON:  None. FINDINGS: No evidence of acute, subacute or healed fractures. Mild patellofemoral compartment joint space narrowing. MEDIAL and LATERAL compartment joint spaces well-preserved. Bone mineral density well preserved for age. No visible joint effusion. Minimal enthesopathic spurring at the insertion of the quadriceps tendon on the superior patella. Femoropopliteal and tibioperoneal artery atherosclerosis. IMPRESSION: Mild patellofemoral compartment joint space narrowing. No acute or subacute osseous abnormality. Electronically Signed   By: Evangeline Dakin M.D.   On: 07/10/2018 20:48    Procedures Procedures (including critical care time)  Medications Ordered in ED Medications  Rivaroxaban (XARELTO) tablet 15 mg (15 mg Oral Given 07/10/18 2139)     Initial Impression / Assessment and Plan / ED Course  I have reviewed the triage vital signs and the nursing notes.  Pertinent labs & imaging results that were available during my care of the patient were reviewed by me and considered in my medical decision making (see chart for details).     Pt with left posterior knee and calf pain with h/o dvt. No obvious exam findings to suggest this condition today but with hx, will order outpt [place] for am to confirm no dvt.  She was given a dose of xaralto here for  prophylactic tx pending this test. Pt understands and agrees with plan.  Pt is ambulatory at baseline with low fall risk. Lives independently without need of assistance.  She was felt to be safe for anticoagulation pending this [place].  Final Clinical Impressions(s) / ED Diagnoses   Final diagnoses:  Left leg pain    ED Discharge Orders         Ordered    US Venous Img Lower Unilateral Left     07/10/18 2119    US Venous Img Lower Unilateral Left  Status:  Canceled     07/10/18 2119  Evalee Jefferson, PA-C 07/11/18 [place], [place], DO 07/14/18 1334

## 2018-07-10 NOTE — Discharge Instructions (Addendum)
Return here tomorrow as discussed for an ultrasound of your left leg to assess for a possible blood clot.  You have been treated for this possible condition tonight with the Xarelto tablet you were given.  You will be given further instructions and prescriptions tomorrow if your test is positive for a clot.  You may come to the emergency department entrance, but do not register as a patient - you will be taken to the radiology department for your study.

## 2018-07-11 ENCOUNTER — Ambulatory Visit (HOSPITAL_COMMUNITY)
Admission: RE | Admit: 2018-07-11 | Discharge: 2018-07-11 | Disposition: A | Discharge status: Home / Self Care | Payer: Medicare Other | Source: Ambulatory Visit | Attending: Emergency Medicine | Admitting: Emergency Medicine

## 2018-07-11 DIAGNOSIS — M79605 Pain in left leg: Secondary | ICD-10-CM | POA: Diagnosis not present

## 2018-07-11 DIAGNOSIS — M79662 Pain in left lower leg: Secondary | ICD-10-CM | POA: Diagnosis not present

## 2018-07-11 NOTE — ED Provider Notes (Signed)
   1400 Maria Alexander was seen here last evening and returned today for scheduled outpatient ultrasound of the left lower extremity.  Ultrasound negative for DVT.  Results discussed with patient.  She agrees to follow-up with her PCP.     US Venous Img Lower Unilateral Left  Result Date: 07/11/2018 CLINICAL DATA:  85 year old with left lower extremity pain for 2 days. EXAM: LEFT LOWER EXTREMITY VENOUS DOPPLER ULTRASOUND TECHNIQUE: Gray-scale sonography with graded compression, as well as color Doppler and duplex ultrasound were performed to evaluate the lower extremity deep venous systems from the level of the common femoral vein and including the common femoral, femoral, profunda femoral, popliteal and calf veins including the posterior tibial, peroneal and gastrocnemius veins when visible. The superficial great saphenous vein was also interrogated. Spectral Doppler was utilized to evaluate flow at rest and with distal augmentation maneuvers in the common femoral, femoral and popliteal veins. COMPARISON:  None. FINDINGS: Contralateral Common Femoral Vein: Respiratory phasicity is normal and symmetric with the symptomatic side. No evidence of thrombus. Normal compressibility. Common Femoral Vein: No evidence of thrombus. Normal compressibility, respiratory phasicity and response to augmentation. Saphenofemoral Junction: No evidence of thrombus. Normal compressibility and flow on color Doppler imaging. Profunda Femoral Vein: No evidence of thrombus. Normal compressibility and flow on color Doppler imaging. Femoral Vein: No evidence of thrombus. Normal compressibility, respiratory phasicity and response to augmentation. Popliteal Vein: No evidence of thrombus. Normal compressibility, respiratory phasicity and response to augmentation. Calf Veins: Visualized left deep calf veins are patent without thrombus. Superficial Great Saphenous Vein: No evidence of thrombus. Normal compressibility. Other Findings:  None.  IMPRESSION: Negative for deep venous thrombosis in left lower extremity. Electronically Signed   By: Markus Daft M.D.   On: 07/11/2018 13:54       Maria Parkinson, PA-C 07/11/18 1733    Milton Ferguson, MD 07/13/18 1136

## 2018-07-13 ENCOUNTER — Other Ambulatory Visit: Payer: Self-pay | Admitting: *Deleted

## 2018-07-13 NOTE — Patient Outreach (Signed)
[place] [facility]) Care Management  07/13/2018  MERISA JULIO 08/25/1923 [id number]   Telephone Screen  Referral Date: 07/13/2018 Referral Source: [facility] referral -[name] Referral Reason: "[facility] member. Phone number is [phone]. Member would like assistance with [name] co pay"  Insurance: [employer] attempt # 1 to pt bt she hs guests at this time and would prefer to return a call to CM  She confirms she has issue with cost of [name] and advance directives  CM left her with CM contact number for a return call      Plan: [facility] RN CM sent an unsuccessful outreach letter and scheduled this patient for another call attempt within 4 business days   Kimberly L. Lavina Hamman, RN, BSN, [place] Coordinator Office number [phone] Mobile number [phone]  Main [facility] number [phone] Fax number [phone]

## 2018-07-14 ENCOUNTER — Other Ambulatory Visit: Payer: Self-pay | Admitting: *Deleted

## 2018-07-14 ENCOUNTER — Other Ambulatory Visit: Payer: Self-pay

## 2018-07-14 NOTE — Patient Outreach (Signed)
[place] [facility]) Care Management  07/14/2018  Maria Alexander 18-Nov-1923 [id number]   Telephone Screen  Referral Date: 07/13/2018 Referral Source: [facility] referral -Maria Alexander Referral Reason: "[facility] member. Phone number is [phone]. Member would like assistance with [name] co pay"  Insurance: [employer] medicare  ED visit to r/o DVT on 07/10/2018  Patient returned a call to Maria Alexander Patient is able to verify HIPAA Reviewed and addressed EMMI red alert/referral to Maria Alexander with patient She confirms the cost of [name] is a concern for her She states she was informed someone would call her to assist with the cost  CM discussed [facility] pharmacist services to discuss this concern if possible  She stated "I understand. I appreciate anything they may be able to do."  She also mentions that a staff member from [employer] will be making an annual visit on 07/26/2018   Social: Maria Alexander is widowed, lives alone but has a supportive daughter Maria Alexander), twin sister, family and friends. She is still active and drives to her medical appointments She is independent with all her care needs. She is on SSI   Conditions: DM, HDL, anxiety, hypothyroidism, chronic hoarseness, hx of DVT, hx stroke     Medications: Maria Alexander report she takes [name] 50 mg qd and her cost is $46.88 She reports not being able to afford this as she is on SSI, This medicine is managed by her primary MD's PA Maria Alexander. She states she has not been seen by an endocrinologist. She reports not receiving samples of [name] in the past and not receiving any assistance from Maria Alexander for medications.    Appointments: next primary MD appointment is in June 2020    Advance Directives: Her daughter is POA    Consent: [facility] RN CM reviewed [facility] services with patient. Patient gave verbal consent for services for Maria Alexander and pharmacy. She denies need of services from Maria Alexander, health coach, NP or SW at  this time after intake assessment completed    Plan [facility] RN CM will refer Maria Maria Alexander to Maria Alexander for assistance with [name] UM referral states Member would like assistance with [name] co pay    Maria Alexander L. Maria Hamman, RN, BSN, Maria Alexander Coordinator Office number [phone] Mobile number [phone]  Main [facility] number [phone] Fax number [phone]

## 2018-07-14 NOTE — Patient Outreach (Signed)
[place] [facility]) Care Management  [place]  07/14/2018  Maria Alexander 09/28/1923 [id number]   Reason for call: medication management  Unsuccessful telephone call attempt # 1 to patient.   HIPAA compliant voicemail left requesting a return call.  Plan:  I will mail patient an unsuccessful outreach letter.  I will make another outreach attempt in 3-4 business days.  [name], PharmD Clinical Pharmacist [place] [phone]

## 2018-07-20 ENCOUNTER — Ambulatory Visit: Payer: Self-pay

## 2018-07-20 ENCOUNTER — Other Ambulatory Visit: Payer: Self-pay

## 2018-07-20 ENCOUNTER — Other Ambulatory Visit: Payer: Self-pay | Admitting: Pharmacy Technician

## 2018-07-20 NOTE — Patient Outreach (Signed)
[facility]) Care Management  [place]   07/20/2018  Maria Alexander 1923/08/10 [id number]  Reason for referral: Medication Assistance with [name]  Referral source: [place]  PMHx includes but not limited to:  Hypothyroidism, diabetes, hypercholesterolemia, anxiety   Outreach:  Successful telephone call with Maria Alexander.  HIPAA identifiers verified.   Subjective:  Maria Alexander reports that she is doing well.  She states that her HgA1c has dropped from 8% to 7% since starting [name], but reports that she is having trouble affording affording the co-pay.    Objective: Lab Results  Component Value Date   CREATININE 0.80 07/10/2018   CREATININE 0.94 02/25/2018   CREATININE 0.87 11/25/2017    Lab Results  Component Value Date   HGBA1C 7.0 (H) 02/25/2018    Lipid Panel     Component Value Date/Time   CHOL 159 02/25/2018 0901   TRIG 150 (H) 02/25/2018 0901   HDL 46 02/25/2018 0901   CHOLHDL 3.5 02/25/2018 0901   LDLCALC 83 02/25/2018 0901    BP Readings from Last 3 Encounters:  07/10/18 119/64  06/08/18 129/71  02/25/18 (!) 149/71    Allergies  Allergen Reactions  . Shellfish Allergy     hives    Medications Reviewed Today    Reviewed by Dionne Milo, [facility] (Pharmacist) on 07/20/18 at [phone]  Med List Status: <None>  Medication Order Taking? Sig Documenting Provider Last Dose Status Informant  ALPRAZolam (XANAX) 1 MG tablet [id number] Yes TAKE  (1)  TABLET TWICE A DAY.  Patient taking differently:  TAKE  (1)  TABLET TWICE A DAY.  Patient states she takes at bedtime.   Terald Sleeper, PA-C Taking Active   Aspirin-Acetaminophen-Caffeine (GOODY HEADACHE PO) [id number] Yes Take by mouth every morning. [provider] Taking Active   atorvastatin (LIPITOR) 80 MG tablet [id number] Yes Take 1/2 tablet (40 mg total) by mouth daily. Terald Sleeper, PA-C Taking Active   glipiZIDE (GLIPIZIDE XL) 5 MG 24  hr tablet [id number] Yes Take 2 tablets (10 mg total) by mouth daily with breakfast. Terald Sleeper, PA-C Taking Active   levothyroxine (SYNTHROID, LEVOTHROID) 50 MCG tablet [id number] Yes Take 1 tablet (50 mcg total) by mouth daily before breakfast. Terald Sleeper, PA-C Taking Active   loratadine (CLARITIN) 10 MG tablet [id number] Yes Take 1 tablet (10 mg total) by mouth daily. Terald Sleeper, PA-C Taking Active   losartan (COZAAR) 100 MG tablet [id number] Yes Take 1 tablet (100 mg total) by mouth daily. Terald Sleeper, PA-C Taking Active   Multiple Vitamin (MULTIVITAMIN) tablet [id number] Yes Take 1 tablet by mouth daily. [provider] Taking Active   sitaGLIPtin ([name]) 50 MG tablet [id number] Yes Take 1 tablet (50 mg total) by mouth daily. Terald Sleeper, PA-C Taking Active           Assessment:  Drugs sorted by system:  Neurologic/Psychologic: alprazolam  Cardiovascular: atorvastatin, losartan  Pulmonary/Allergy: loratadine  Endocrine: glipizide, sitagliptin, levothyroxine  Pain: Goody headache powder   Vitamins/Minerals/Supplements: MVI  Medication Review Findings:  . Per the [place] List, alprazolam may have decreased metabolism and increases sensitivity in older adults.  Risk of cognitive impairment, delirium, falls, fractures and motor vehicle accidents may occur. There is strong evidence to avoid use in the elderly.  Medication Assistance Findings:  Medication assistance needs identified.   Extra Help:   []  Already receiving Full Extra Help  []  Already receiving Partial Extra  Help  []  Eligible based on reported income and assets  [x]  Not Eligible based on reported income and assets  Patient Assistance Programs: 1) [name] made by [employer] o Income requirement met: [x]  Yes []  No []  Unknown o Out-of-pocket prescription expenditure met:    []  Yes []  No  []  Unknown  [x]  Not applicable - Patient has met application requirements to apply for this patient assistance  program.    Plan: I will route patient assistance letter to [place] technician who will coordinate patient assistance program application process for medications listed above.  [facility] pharmacy technician will assist with obtaining all required documents from both patient and provider(s) and submit application(s) once completed.    [name], PharmD Clinical Pharmacist [place] [phone]

## 2018-07-20 NOTE — Patient Outreach (Signed)
[facility]) Care Management  07/20/2018  Maria Alexander 06/28/1924 [id number]                                                  Medication Assistance Referral  Referral From: [facility] RPh [name]  Medication/Company: Celesta Gentile / [employer] Patient application portion:  Mailed Provider application portion: [employer] to [name]    Follow up:  Will follow up with patient in 5-7 business days to confirm application(s) have been received.  [name] Chana Bode [place] Certified Pharmacy Technician [place] Management Direct Dial:[phone]

## 2018-08-02 ENCOUNTER — Ambulatory Visit (INDEPENDENT_AMBULATORY_CARE_PROVIDER_SITE_OTHER): Payer: Medicare Other | Admitting: Physician Assistant

## 2018-08-02 ENCOUNTER — Encounter: Payer: Self-pay | Admitting: Physician Assistant

## 2018-08-02 VITALS — BP 154/75 | HR 71 | Temp 96.9°F | Ht 60.0 in | Wt 132.6 lb

## 2018-08-02 DIAGNOSIS — M25462 Effusion, left knee: Secondary | ICD-10-CM

## 2018-08-02 DIAGNOSIS — S8992XA Unspecified injury of left lower leg, initial encounter: Secondary | ICD-10-CM

## 2018-08-02 DIAGNOSIS — S8992XD Unspecified injury of left lower leg, subsequent encounter: Secondary | ICD-10-CM

## 2018-08-02 DIAGNOSIS — M25562 Pain in left knee: Secondary | ICD-10-CM | POA: Diagnosis not present

## 2018-08-02 NOTE — Progress Notes (Signed)
BP (!) 154/75   Pulse 71   Temp (!) 96.9 F (36.1 C) (Oral)   Ht 5' (1.524 m)   Wt 132 lb 9.6 oz (60.1 kg)   BMI 25.90 kg/m    Subjective:    Patient ID: Maria Alexander, female    DOB: Nov 03, 1923, 85 y.o.   MRN: [id number]  HPI: Maria Alexander is a 85 y.o. female presenting on 08/02/2018 for Leg Pain (left)  Going on's approximately 3 weeks ago the patient states that she fell when she came off of a curb in a parking lot.  She did not fall all the way down and caught herself but did feel the pain in the lateral portion of her left knee.  She did not hear a pop or click at that time.  She has tried to keep it rested but has somewhat.  She is using minimal over-the-counter medication.  She used ice a little bit at the beginning but has not found much relief in it.  She states that it still is starting to have more swelling and it hurts more when she is up on it.  She tends to try to be very active and this is slowing her down.  She is never had it evaluated by orthopedics.  She did go to the emergency room because she was concerned the swelling was a blood clot.  She has had a problem with a blood clot in the past.  The scan however was negative.  The x-ray did show some degenerative changes but no acute bony injury.    Past Medical History:  Diagnosis Date  . Diabetes ([place])   . DVT (deep venous thrombosis) ([place])   . High cholesterol   . Hypothyroid   . Stroke [facility])    Relevant past medical, surgical, family and social history reviewed and updated as indicated. Interim medical history since our last visit reviewed. Allergies and medications reviewed and updated. DATA REVIEWED: CHART IN EPIC  Family History reviewed for pertinent findings.  Review of Systems  Constitutional: Negative.   HENT: Negative.   Eyes: Negative.   Respiratory: Negative.   Gastrointestinal: Negative.   Genitourinary: Negative.   Musculoskeletal: Positive for arthralgias and joint swelling.    Allergies  as of 08/02/2018      Reactions   Shellfish Allergy    hives      Medication List       Accurate as of August 02, 2018 10:09 PM. Always use your most recent med list.        ALPRAZolam 1 MG tablet Commonly known as:  XANAX TAKE  (1)  TABLET TWICE A DAY.   atorvastatin 80 MG tablet Commonly known as:  LIPITOR Take 1/2 tablet (40 mg total) by mouth daily.   glipiZIDE 5 MG 24 hr tablet Commonly known as:  GLIPIZIDE XL Take 2 tablets (10 mg total) by mouth daily with breakfast.   GOODY HEADACHE PO Take by mouth every morning.   levothyroxine 50 MCG tablet Commonly known as:  SYNTHROID, LEVOTHROID Take 1 tablet (50 mcg total) by mouth daily before breakfast.   loratadine 10 MG tablet Commonly known as:  CLARITIN Take 1 tablet (10 mg total) by mouth daily.   losartan 100 MG tablet Commonly known as:  COZAAR Take 1 tablet (100 mg total) by mouth daily.   multivitamin tablet Take 1 tablet by mouth daily.   sitaGLIPtin 50 MG tablet Commonly known as:  [name] Take 1 tablet (  50 mg total) by mouth daily.          Objective:    BP (!) 154/75   Pulse 71   Temp (!) 96.9 F (36.1 C) (Oral)   Ht 5' (1.524 m)   Wt 132 lb 9.6 oz (60.1 kg)   BMI 25.90 kg/m   Allergies  Allergen Reactions  . Shellfish Allergy     hives    Wt Readings from Last 3 Encounters:  08/02/18 132 lb 9.6 oz (60.1 kg)  07/10/18 130 lb (59 kg)  06/08/18 132 lb (59.9 kg)    Physical Exam Constitutional:      Appearance: She is well-developed.  HENT:     Head: Normocephalic and atraumatic.  Eyes:     Conjunctiva/sclera: Conjunctivae normal.     Pupils: Pupils are equal, round, and reactive to light.  Cardiovascular:     Rate and Rhythm: Normal rate and regular rhythm.     Heart sounds: Normal heart sounds.  Pulmonary:     Effort: Pulmonary effort is normal.     Breath sounds: Normal breath sounds.  Abdominal:     General: Bowel sounds are normal.     Palpations: Abdomen is soft.    Musculoskeletal:     Left knee: She exhibits swelling and bony tenderness. She exhibits normal range of motion, no effusion, no deformity, no erythema, normal alignment and normal patellar mobility. Tenderness found. Lateral joint line tenderness noted.       Legs:  Skin:    General: Skin is warm and dry.     Findings: No rash.  Neurological:     Mental Status: She is alert and oriented to person, place, and time.     Deep Tendon Reflexes: Reflexes are normal and symmetric.  Psychiatric:        Behavior: Behavior normal.        Thought Content: Thought content normal.        Judgment: Judgment normal.     Results for orders placed or performed during the hospital encounter of 07/10/18  I-stat chem 8, ed  Result Value Ref Range   Sodium 136 135 - 145 mmol/L   Potassium 3.8 3.5 - 5.1 mmol/L   Chloride 101 98 - 111 mmol/L   BUN 16 8 - 23 mg/dL   Creatinine, Ser 0.80 0.44 - 1.00 mg/dL   Glucose, Bld 208 (H) 70 - 99 mg/dL   Calcium, Ion 1.24 1.15 - 1.40 mmol/L   TCO2 26 22 - 32 mmol/L   Hemoglobin 11.6 (L) 12.0 - 15.0 g/dL   HCT 34.0 (L) 36.0 - 46.0 %      Assessment & Plan:   1. Left knee injury, subsequent encounter Return for injection of her left knee  2. Pain and swelling of knee, left Rest  compression   Continue all other maintenance medications as listed above.  Follow up plan: No follow-ups on file.  Educational handout given for [place] PA-C [place] [address]  [place], [place] [address] [phone]   08/02/2018, 10:09 PM

## 2018-08-03 ENCOUNTER — Encounter: Payer: Self-pay | Admitting: Family Medicine

## 2018-08-03 ENCOUNTER — Ambulatory Visit (INDEPENDENT_AMBULATORY_CARE_PROVIDER_SITE_OTHER): Payer: Medicare Other | Admitting: Family Medicine

## 2018-08-03 VITALS — BP 139/71 | HR 63 | Temp 97.0°F | Ht 60.0 in | Wt 132.0 lb

## 2018-08-03 DIAGNOSIS — M25562 Pain in left knee: Secondary | ICD-10-CM | POA: Diagnosis not present

## 2018-08-03 MED ORDER — METHYLPREDNISOLONE ACETATE 80 MG/ML IJ SUSP
40.0000 mg | Freq: Once | INTRAMUSCULAR | Status: AC
  Administered 2018-08-03: 40 mg via INTRA_ARTICULAR

## 2018-08-03 NOTE — Progress Notes (Signed)
Subjective: CC: needs knee injection PCP: Terald Sleeper, PA-C CBU:LAGTXMI Maria Alexander is a 85 y.o. female presenting to clinic today for:  Patient seen by her PCP yesterday who recommends corticosteroid injection to the knee.  She is here today for this issue.  Swelling seems somewhat better since yesterday.   ROS: Per HPI  Allergies  Allergen Reactions  . Shellfish Allergy     hives   Past Medical History:  Diagnosis Date  . Diabetes ([place])   . DVT (deep venous thrombosis) ([place])   . High cholesterol   . Hypothyroid   . Stroke [facility])     Current Outpatient Medications:  .  ALPRAZolam (XANAX) 1 MG tablet, TAKE  (1)  TABLET TWICE A DAY. (Patient taking differently: TAKE  (1)  TABLET TWICE A DAY.  Patient states she takes at bedtime.), Disp: 60 tablet, Rfl: 5 .  Aspirin-Acetaminophen-Caffeine (GOODY HEADACHE PO), Take by mouth every morning., Disp: , Rfl:  .  atorvastatin (LIPITOR) 80 MG tablet, Take 1/2 tablet (40 mg total) by mouth daily., Disp: 30 tablet, Rfl: 5 .  glipiZIDE (GLIPIZIDE XL) 5 MG 24 hr tablet, Take 2 tablets (10 mg total) by mouth daily with breakfast., Disp: 180 tablet, Rfl: 3 .  levothyroxine (SYNTHROID, LEVOTHROID) 50 MCG tablet, Take 1 tablet (50 mcg total) by mouth daily before breakfast., Disp: 30 tablet, Rfl: 11 .  loratadine (CLARITIN) 10 MG tablet, Take 1 tablet (10 mg total) by mouth daily., Disp: 30 tablet, Rfl: 11 .  losartan (COZAAR) 100 MG tablet, Take 1 tablet (100 mg total) by mouth daily., Disp: 90 tablet, Rfl: 3 .  Multiple Vitamin (MULTIVITAMIN) tablet, Take 1 tablet by mouth daily., Disp: , Rfl:  .  sitaGLIPtin ([name]) 50 MG tablet, Take 1 tablet (50 mg total) by mouth daily., Disp: 90 tablet, Rfl: 3 Social History   Socioeconomic History  . Marital status: Widowed    Spouse name: Not on file  . Number of children: Not on file  . Years of education: Not on file  . Highest education level: Not on file  Occupational History  . Not on file    Social Needs  . Financial resource strain: Not on file  . Food insecurity:    Worry: Not on file    Inability: Not on file  . Transportation needs:    Medical: Not on file    Non-medical: Not on file  Tobacco Use  . Smoking status: Former Smoker    Types: Cigarettes    Last attempt to quit: 02/05/1995    Years since quitting: 23.5  . Smokeless tobacco: Never Used  Substance and Sexual Activity  . Alcohol use: No    Alcohol/week: 0.0 standard drinks  . Drug use: No  . Sexual activity: Not on file  Lifestyle  . Physical activity:    Days per week: Not on file    Minutes per session: Not on file  . Stress: Not on file  Relationships  . Social connections:    Talks on phone: Not on file    Gets together: Not on file    Attends religious service: Not on file    Active member of club or organization: Not on file    Attends meetings of clubs or organizations: Not on file    Relationship status: Not on file  . Intimate partner violence:    Fear of current or ex partner: Not on file    Emotionally abused: Not on file  Physically abused: Not on file    Forced sexual activity: Not on file  Other Topics Concern  . Not on file  Social History Narrative  . Not on file   Family History  Problem Relation Age of Onset  . Cancer Mother   . Heart disease Father        before age 6  . Heart attack Father   . Diabetes Sister   . Diabetes Brother     Objective: Office vital signs reviewed. There were no vitals taken for this visit.  Physical Examination:  General: Awake, alert, well nourished, No acute distress L Knee: No gross swelling or joint effusion.  No discoloration.  No tenderness to palpation to the patella, patellar tendon, quad tendon or joint line.  JOINT INJECTION:  Patient denies allergy to antiseptics (including iodine) and anesthetics.  Patient denies h/o diabetes, frequent steroid use, use of blood thinners/ antiplatelets.  Patient was given informed  consent and a signed copy has been placed in the chart. Appropriate time out was taken. Area prepped and draped in usual sterile fashion. Anatomic landmarks were identified and injection site was marked.  Ethyl chloride spray was used to numb the area and 1 cc of methylprednisolone 40 mg/ml plus  3 cc of 1% lidocaine without epinephrine was injected into the left knee using a(n) anteriolateral approach. The patient tolerated the procedure well and there were no immediate complications. Estimated blood loss is less than 1 cc.  Post procedure instructions were reviewed.  Assessment/ Plan: 85 y.o. female   1. Left knee pain, unspecified chronicity Corticosteroid injection to the left knee placed.  Patient tolerated procedure without difficulty.  Home care instructions reviewed.  Reasons for emergent evaluation reviewed. Follow-up PRN. - methylPREDNISolone acetate (DEPO-MEDROL) injection 40 mg   No orders of the defined types were placed in this encounter.  No orders of the defined types were placed in this encounter.    Janora Norlander, DO [place] [phone]

## 2018-08-08 ENCOUNTER — Other Ambulatory Visit: Payer: Self-pay | Admitting: Pharmacy Technician

## 2018-08-08 ENCOUNTER — Other Ambulatory Visit: Payer: Self-pay

## 2018-08-08 NOTE — Patient Outreach (Signed)
[name] Care Management  [place]  08/08/2018  Maria Alexander 1923-09-19 [id number]  Successful outreach to Maria Alexander.  HIPAA identifiers verified.  Medication Assistance: [name] states that she is worried that she is over the income limit for [name] patient assistance.  Informed her that the [employer] income limit is >$51,000/yr.  She states that she is below the income limit.  Informed her to get the application completed that we sent to her and return it as soon as possible.  She verbalized understanding.  Gave her the phone number to [name], [name] to call if she has questions about completing the application.   [name], PharmD Clinical Pharmacist [name] [phone]

## 2018-08-08 NOTE — Patient Outreach (Signed)
[facility]) Care Management  08/08/2018  Maria Alexander [birth year] [id number]    Successful call placed to patient regarding patient assistance application(s) for [name] , HIPAA identifiers verified. [name] confirms that she received the patient assistance application that was mailed out to her. She has requested to "speak to the lady, that she spoke to before before because she thinks she makes too much money". Informed her that I would reach out to [place] and have her call her.   [name] Chana Bode [place] Certified Pharmacy Technician [place] Management Direct Dial:[phone]

## 2018-08-15 ENCOUNTER — Encounter: Payer: Self-pay | Admitting: Pharmacy Technician

## 2018-08-17 ENCOUNTER — Other Ambulatory Visit: Payer: Self-pay | Admitting: Pharmacy Technician

## 2018-08-17 NOTE — Patient Outreach (Signed)
[facility]) Care Management  08/17/2018  Maria Alexander [birth year] [id number]    Successful call placed to patient regarding patient assistance application(s) for [name] , HIPAA identifiers verified. Informed [name] that I mailed back the patient assistance application to her because it is missing a signature. She stated that she would sign and mail back as soon as she receives it.  Will follow up with patient in 5-7 business days to confirm she received it in the mail.  [name] Chana Bode [place] Certified Pharmacy Technician [place] Management Direct Dial:[phone]

## 2018-08-18 ENCOUNTER — Other Ambulatory Visit: Payer: Self-pay | Admitting: Pharmacy Technician

## 2018-08-18 NOTE — Patient Outreach (Signed)
[facility]) Care Management  08/18/2018  Maria Alexander Mar 23, 1924 [id number]   Received patient portion(s) of patient assistance application for [name]. Prepared to mail completed application and required documents into [employer].  Will follow up with company in 14-21 business days to check status of application.  [name] Chana Bode [place] Certified Pharmacy Technician [place] Management Direct Dial:[phone]

## 2018-08-23 ENCOUNTER — Encounter: Payer: Self-pay | Admitting: Family Medicine

## 2018-08-23 ENCOUNTER — Ambulatory Visit (INDEPENDENT_AMBULATORY_CARE_PROVIDER_SITE_OTHER): Payer: Medicare Other | Admitting: Family Medicine

## 2018-08-23 VITALS — BP 135/81 | HR 87 | Temp 98.4°F | Ht 60.0 in | Wt 130.0 lb

## 2018-08-23 DIAGNOSIS — M25562 Pain in left knee: Secondary | ICD-10-CM

## 2018-08-23 MED ORDER — DICLOFENAC SODIUM 1 % TD GEL: 2.0000 g | Freq: Four times a day (QID) | TRANSDERMAL | 1 refills | Status: AC

## 2018-08-23 NOTE — Patient Instructions (Signed)

## 2018-08-23 NOTE — Progress Notes (Signed)
Maria Alexander is a 85 y.o. female   HPI:  Knee Pain: Patient presents with knee pain involving the  left knee. Onset of the symptoms was 4 days ago. Inciting event: injured while trying to avoid falling. Pt states she stumbled but did not fall.. Current symptoms include crepitus sensation, pain located lateral knee and stiffness. Pain is aggravated by going up and down stairs and walking.  Patient has had prior knee problems. Evaluation to date: plain films: abnormal with mild patellofemoral compartment joint space narrowing.. Treatment to date: avoidance of offending activity, brace which is not very effective, corticosteroid injection which was somewhat effective, ice, rest and heat.  Past Medical History:  Diagnosis Date  . Diabetes ([place])   . DVT (deep venous thrombosis) ([place])   . High cholesterol   . Hypothyroid   . Stroke [facility])    History reviewed. No pertinent surgical history.  Current Outpatient Medications:  .  ALPRAZolam (XANAX) 1 MG tablet, TAKE  (1)  TABLET TWICE A DAY. (Patient taking differently: TAKE  (1)  TABLET TWICE A DAY.  Patient states she takes at bedtime.), Disp: 60 tablet, Rfl: 5 .  Aspirin-Acetaminophen-Caffeine (GOODY HEADACHE PO), Take by mouth every morning., Disp: , Rfl:  .  atorvastatin (LIPITOR) 80 MG tablet, Take 1/2 tablet (40 mg total) by mouth daily., Disp: 30 tablet, Rfl: 5 .  glipiZIDE (GLIPIZIDE XL) 5 MG 24 hr tablet, Take 2 tablets (10 mg total) by mouth daily with breakfast., Disp: 180 tablet, Rfl: 3 .  levothyroxine (SYNTHROID, LEVOTHROID) 50 MCG tablet, Take 1 tablet (50 mcg total) by mouth daily before breakfast., Disp: 30 tablet, Rfl: 11 .  loratadine (CLARITIN) 10 MG tablet, Take 1 tablet (10 mg total) by mouth daily., Disp: 30 tablet, Rfl: 11 .  losartan (COZAAR) 100 MG tablet, Take 1 tablet (100 mg total) by mouth daily., Disp: 90 tablet, Rfl: 3 .  Multiple Vitamin (MULTIVITAMIN) tablet, Take 1 tablet by mouth daily., Disp: , Rfl:  .   sitaGLIPtin ([name]) 50 MG tablet, Take 1 tablet (50 mg total) by mouth daily., Disp: 90 tablet, Rfl: 3 .  diclofenac sodium (VOLTAREN) 1 % GEL, Apply 2 g topically 4 (four) times daily for 14 days., Disp: 1 Tube, Rfl: 1 Allergies  Allergen Reactions  . Shellfish Allergy     hives    reports that she quit smoking about 23 years ago. Her smoking use included cigarettes. She has never used smokeless tobacco. She reports that she does not drink alcohol or use drugs. Family History  Problem Relation Age of Onset  . Cancer Mother   . Heart disease Father        before age 43  . Heart attack Father   . Diabetes Sister   . Diabetes Brother    Review of Systems  Constitutional: Negative for chills and fever.  Respiratory: Negative for cough and shortness of breath.   Cardiovascular: Negative for chest pain, palpitations and leg swelling.  Musculoskeletal: Positive for joint pain (left knee).  Neurological: Negative for tingling and weakness.  All other systems reviewed and are negative.  Physical Exam  Constitutional: She is well-developed, well-nourished, and in no distress. Vital signs are normal. No distress.  HENT:  Head: Normocephalic and atraumatic.  Eyes: Pupils are equal, round, and reactive to light. Conjunctivae are normal.  Cardiovascular: Normal rate, regular rhythm and intact distal pulses. Exam reveals no gallop and no friction rub.  No murmur heard. Pulmonary/Chest: Effort normal  and breath sounds normal.  Musculoskeletal:     Left hip: Normal.     Right knee: Normal.     Left ankle: Normal.  Neurological: She is alert. She has normal reflexes.  Skin: Skin is warm and dry.  Psychiatric: Mood, memory, affect and judgment normal.    Left Knee: Normal to inspection with no erythema or effusion or obvious bony abnormalities. Palpation normal with no warmth or joint line tenderness or patellar tenderness or condyle tenderness. Minimal lateral knee tenderness. ROM  normal in flexion and extension and lower leg rotation. Ligaments with solid consistent endpoints including ACL, PCL, LCL, MCL. Negative Mcmurray's and provocative meniscal tests. Non painful patellar compression. Patellar and quadriceps tendons unremarkable.  Hamstring and quadriceps strength is normal. Distal pulses intact. Sensation and cap refill intact.    Assessment and Plan  Maria Alexander was seen today for left knee pain.  Diagnoses and all orders for this visit:  Left lateral knee pain Due to ongoing symptoms, will refer to PT. Voltaren gel as prescribed. Report any new or worsening symptoms.  -     diclofenac sodium (VOLTAREN) 1 % GEL; Apply 2 g topically 4 (four) times daily for 14 days. -     Ambulatory referral to Physical Therapy   Return in about 4 weeks (around 09/20/2018), or if symptoms worsen or fail to improve.  The above assessment and management plan was discussed with the patient. The patient verbalized understanding of and has agreed to the management plan. Patient is aware to call the clinic if symptoms fail to improve or worsen. Patient is aware when to return to the clinic for a follow-up visit. Patient educated on when it is appropriate to go to the emergency department.   Monia Pouch, FNP-C [facility] [address] [place], [place] [address] [phone]

## 2018-08-29 ENCOUNTER — Other Ambulatory Visit: Payer: Self-pay

## 2018-08-29 ENCOUNTER — Encounter: Payer: Self-pay | Admitting: Physical Therapy

## 2018-08-29 ENCOUNTER — Ambulatory Visit: Payer: Medicare Other | Attending: Family Medicine | Admitting: Physical Therapy

## 2018-08-29 DIAGNOSIS — M25662 Stiffness of left knee, not elsewhere classified: Secondary | ICD-10-CM | POA: Diagnosis not present

## 2018-08-29 DIAGNOSIS — M25562 Pain in left knee: Secondary | ICD-10-CM

## 2018-08-29 DIAGNOSIS — M6281 Muscle weakness (generalized): Secondary | ICD-10-CM | POA: Diagnosis not present

## 2018-08-29 NOTE — Therapy (Addendum)
[facility] [place], [place], [address] Phone: [phone]   Fax:  [phone]  Physical Therapy Evaluation  Patient Details  Name: Maria Alexander MRN: [id number] Date of Birth: 09/04/1923 Referring Provider (PT): Darla Lesches.   Encounter Date: 08/29/2018  PT End of Session - 08/29/18 1041    Visit Number  1    Number of Visits  8    Date for PT Re-Evaluation  11/27/18    Authorization Type  FOTO AT LEAST EVERY 5TH VISIT.  KX MODIFIER AFTER 15 VISITS.  PROGRESS NOTE AT 10TH VISIT.    PT Start Time  0945    PT Stop Time  1033    PT Time Calculation (min)  48 min    Activity Tolerance  Patient tolerated treatment well    Behavior During Therapy  WFL for tasks assessed/performed       Past Medical History:  Diagnosis Date  . Diabetes ([place])   . DVT (deep venous thrombosis) ([place])   . High cholesterol   . Hypothyroid   . Stroke [facility])     History reviewed. No pertinent surgical history.  There were no vitals filed for this visit.   Subjective Assessment - 08/29/18 1020    Subjective  The patient presents to the clinic today with c/o left knee pain over the last several weeks due to 2 near falls involving curbs.  After her first injury she received an injection which helped a lot.  After her second injury she states "all the good the shot did was undone".  Her pain-level today is a 6/10 with pain increasing with lifting and rest decreases her pain.    Pertinent History  Stroke, DM.    Diagnostic tests  X-ray.    Patient Stated Goals  Get out of pain.    Currently in Pain?  Yes    Pain Score  6     Pain Orientation  Left    Pain Descriptors / Indicators  Aching    Pain Type  Acute pain    Pain Onset  More than a month ago    Pain Frequency  Constant    Aggravating Factors   See above.    Pain Relieving Factors  See above.         [facility] PT Assessment - 08/29/18 0001      Assessment   Medical Diagnosis  Left  lateral knee pain.    Referring Provider (PT)  Darla Lesches.    Onset Date/Surgical Date  --   ~6 weeks.     Precautions   Precautions  Fall    Precaution Comments  --   Recommend cane use.     Restrictions   Weight Bearing Restrictions  No      Balance Screen   Has the patient fallen in the past 6 months  Yes    How many times?  --   2.   Has the patient had a decrease in activity level because of a fear of falling?   Yes    Is the patient reluctant to leave their home because of a fear of falling?   Yes      [place] residence      Prior Function   Level of Independence  Independent      Observation/Other Assessments   Focus on Therapeutic Outcomes (FOTO)   60% limitation.      Observation/Other Assessments-Edema  Edema  --   No observable left knee edema.     Posture/Postural Control   Posture Comments  --   Left knee valgum.     ROM / Strength   AROM / PROM / Strength  AROM;Strength      AROM   Overall AROM Comments  Left knee -15 to 120 degrees.      Strength   Overall Strength Comments  Left hip= 4-/5 and left knee= 4/5.      Palpation   Palpation comment  Patient very tender to palpation in the region of the patient's left lateral joint line and especially at Gerdy's tubercle and near the left fibular head.      Ambulation/Gait   Gait Comments  Mild gait antalgia observed.                Objective measurements completed on examination: See above findings.      [facility] Adult PT Treatment/Exercise - 08/29/18 0001      Modalities   Modalities  Electrical Stimulation;Moist Heat      Moist Heat Therapy   Number Minutes Moist Heat  15 Minutes    Moist Heat Location  --   Left knee.     Electrical Stimulation   Electrical Stimulation Location  Left lateral knee.    Electrical Stimulation Action  Pre-mod.    Electrical Stimulation Parameters  80-150 Hz x 15 minutes.    Electrical Stimulation Goals   Pain               PT Short Term Goals - 08/29/18 1146      PT SHORT TERM GOAL #1   Title  STG's=LTG's.        PT Long Term Goals - 08/29/18 1147      PT LONG TERM GOAL #1   Title  Ind with a HEP.    Time  6    Period  Weeks    Status  New      PT LONG TERM GOAL #2   Title  Full active left knee extension.    Time  6    Period  Weeks    Status  New      PT LONG TERM GOAL #3   Title  Perform ADL's with pain not > 2-3/10.    Time  6    Period  Weeks    Status  New             Plan - 08/29/18 1113    Clinical Impression Statement  The patient presents to OPPT with c/o left knee pain.  She is quite tender in the region of her left lateral joint line and especially in the region of the Gerdy's tubercle.  She is lacking left knee extension and demonstrates associated left hip and knee strength deficits.  Patient will benefit from skilled physical therapy intervention to address deficits and pain.      Personal Factors and Comorbidities  Age    Examination-Activity Limitations  Stairs    Examination-Participation Restrictions  Community Activity    Stability/Clinical Decision Making  Stable/Uncomplicated    Clinical Decision Making  Low    Rehab Potential  Excellent    PT Frequency  2x / week    PT Duration  4 weeks    PT Treatment/Interventions  ADLs/Self Care Home Management;Cryotherapy;Electrical Stimulation;Ultrasound;Moist Heat;Iontophoresis 86m/ml Dexamethasone;Therapeutic activities;Therapeutic exercise;Patient/family education;Manual techniques;Vasopneumatic Device;Passive range of motion    PT Next Visit Plan  [place] per signed  cert, stretching to improve left knee extension, pulsed combo e'stim/U/S, STW/M.Marland KitchenMarland KitchenIASTM; E'stim.    Consulted and Agree with Plan of Care  Patient       Patient will benefit from skilled therapeutic intervention in order to improve the following deficits and impairments:  Abnormal gait, Pain, Decreased activity tolerance,  Decreased range of motion, Decreased strength  Visit Diagnosis: Acute pain of left knee - Plan: PT plan of care cert/re-cert  Stiffness of left knee, not elsewhere classified - Plan: PT plan of care cert/re-cert  Muscle weakness (generalized) - Plan: PT plan of care cert/re-cert     Problem List Patient Active Problem List   Diagnosis Date Noted  . Anxiety 11/29/2017  . Chronic hoarseness 01/15/2017  . Hypothyroidism 06/06/2014  . High cholesterol 06/06/2014  . Diabetes ([place]) 06/06/2014   PHYSICAL THERAPY DISCHARGE SUMMARY  Visits from Start of Care: 1.  Current functional level related to goals / functional outcomes: See above.   Remaining deficits: See below.   Education / Equipment:  Plan: Patient agrees to discharge.  Patient goals were not met. Patient is being discharged due to not returning since the last visit.  ?????      Ardyn Forge, [place] MPT 08/29/2018, 11:51 AM  [facility] [address] [place], [place], [address] Phone: [phone]   Fax:  [phone]  Name: Maria Alexander MRN: [id number] Date of Birth: 12-12-1923

## 2018-09-05 ENCOUNTER — Ambulatory Visit: Payer: Medicare Other | Admitting: Physical Therapy

## 2018-09-15 ENCOUNTER — Other Ambulatory Visit: Payer: Self-pay | Admitting: Pharmacy Technician

## 2018-09-15 NOTE — Patient Outreach (Signed)
[facility]) Care Management  09/15/2018  Maria Alexander Oct 28, 1923 [id number]    Follow up call placed to [employer] regarding patient assistance application(s) for [name] confirms application has been received and attestation for was mailed out to patient on 3/12.    Successful call placed to patient regarding patient assistance receipt of attestation form from [place] for [employer], HIPAA identifiers verified. Ms. Hinde confirms that she received the attestation form and mailed it back in.  Follow up:  Will follow up with [employer] in 5-7 business days to check status of application.  [name] Chana Bode [place] Certified Pharmacy Technician [place] Management Direct [phone]

## 2018-10-03 ENCOUNTER — Other Ambulatory Visit: Payer: Self-pay | Admitting: Pharmacy Technician

## 2018-10-03 NOTE — Patient Outreach (Signed)
[place] [facility]) Care Management  10/03/2018  RINDI BEECHY [birth year] [id number]    Follow up call placed to [employer] regarding patient assistance application(s) for Willette Brace confirms patient has been approved as of 3/25 until 06/29/19.    Follow up:  Will follow up with [facility] in 1-2 business days to check shipment of medication.  [name] Chana Bode [place] Certified Pharmacy Technician [place] Management Direct Dial:[phone]

## 2018-10-17 ENCOUNTER — Other Ambulatory Visit: Payer: Self-pay | Admitting: Pharmacy Technician

## 2018-10-17 NOTE — Patient Outreach (Signed)
[place] [facility]) Care Management  10/17/2018  Maria Alexander Feb 23, 1924 [id number]    Successful call placed to patient regarding patient assistance medication receipt from [place], HIPAA identifiers verified. Ms. Ellerbrock confirms that she received a 5 day supply of [name] from [employer]. Reviewed with patient how to obtain refills and requested that she contact me with any issues she may encounter. She confirms that she has no additional questions at this time.  Follow up:  Will route note to [place] for case closure.  [name] Chana Bode [place] Certified Pharmacy Technician [place] Management Direct [phone]

## 2018-10-18 ENCOUNTER — Other Ambulatory Visit: Payer: Self-pay | Admitting: Pharmacist

## 2018-10-18 ENCOUNTER — Encounter: Payer: Self-pay | Admitting: Pharmacist

## 2018-10-18 NOTE — Patient Outreach (Signed)
[name] Care Management  10/18/2018  Maria Alexander 1923/11/19 [id number]  In basket message received from [place] that patient was approved for and received manufacturer assistance for sitagliptin ([name]) from [employer] patient assistance program.    See note in this chart from 10/17/2018 for details.    Plan:  As patient reports no further needs, case will be closed.   Will send closure letter to provider and will update provider that patient received [name].   Karrie Meres, PharmD, [place] [phone]

## 2018-11-03 ENCOUNTER — Encounter: Payer: Self-pay | Admitting: Family Medicine

## 2018-11-03 ENCOUNTER — Encounter: Payer: Self-pay | Admitting: *Deleted

## 2018-11-03 ENCOUNTER — Ambulatory Visit (INDEPENDENT_AMBULATORY_CARE_PROVIDER_SITE_OTHER): Payer: Medicare Other | Admitting: Family Medicine

## 2018-11-03 ENCOUNTER — Other Ambulatory Visit: Payer: Self-pay

## 2018-11-03 VITALS — BP 143/72 | HR 85 | Temp 97.0°F | Ht 60.0 in | Wt 132.4 lb

## 2018-11-03 DIAGNOSIS — W57XXXA Bitten or stung by nonvenomous insect and other nonvenomous arthropods, initial encounter: Secondary | ICD-10-CM

## 2018-11-03 DIAGNOSIS — M7061 Trochanteric bursitis, right hip: Secondary | ICD-10-CM

## 2018-11-03 MED ORDER — DOXYCYCLINE HYCLATE 100 MG PO TABS: 200.0000 mg | ORAL_TABLET | Freq: Once | ORAL | 0 refills | Status: AC

## 2018-11-03 MED ORDER — METHYLPREDNISOLONE ACETATE 40 MG/ML IJ SUSP
40.0000 mg | Freq: Once | INTRAMUSCULAR | Status: AC
  Administered 2018-11-03: 12:00:00 40 mg via INTRAMUSCULAR

## 2018-11-03 NOTE — Progress Notes (Signed)
Subjective: CC: Hip pain PCP: Terald Sleeper, PA-C EXB:[id number] Maria Alexander is a 85 y.o. female presenting to clinic today for:  1.  Hip pain Patient reports several day history of right-sided hip pain.  She points to the lateral aspect of the right hip over the trochanteric bursa.  She notes this is painful to lie on and when she moves in certain positions.  Denies any preceding injury, falls, discoloration of the skin or swelling.  No therapies tried thus far.  No weakness or sensation changes.  2.  Foreign body Patient reports that she felt something on her skin that she thinks may be a tick on her right flank.  She felt yesterday when in the shower but was unable to visualize anything.  Denies any fevers, myalgia, nausea, vomiting or headache.  No known rashes.  She does not go outside much.  No pets.   Lab Results  Component Value Date   HGBA1C 7.0 (H) 02/25/2018   ROS: Per HPI  Allergies  Allergen Reactions  . Shellfish Allergy     hives   Past Medical History:  Diagnosis Date  . Diabetes ([place])   . DVT (deep venous thrombosis) ([place])   . High cholesterol   . Hypothyroid   . Stroke [facility])     Current Outpatient Medications:  .  ALPRAZolam (XANAX) 1 MG tablet, TAKE  (1)  TABLET TWICE A DAY. (Patient taking differently: TAKE  (1)  TABLET TWICE A DAY.  Patient states she takes at bedtime.), Disp: 60 tablet, Rfl: 5 .  Aspirin-Acetaminophen-Caffeine (GOODY HEADACHE PO), Take by mouth every morning., Disp: , Rfl:  .  atorvastatin (LIPITOR) 80 MG tablet, Take 1/2 tablet (40 mg total) by mouth daily., Disp: 30 tablet, Rfl: 5 .  glipiZIDE (GLIPIZIDE XL) 5 MG 24 hr tablet, Take 2 tablets (10 mg total) by mouth daily with breakfast., Disp: 180 tablet, Rfl: 3 .  levothyroxine (SYNTHROID, LEVOTHROID) 50 MCG tablet, Take 1 tablet (50 mcg total) by mouth daily before breakfast., Disp: 30 tablet, Rfl: 11 .  loratadine (CLARITIN) 10 MG tablet, Take 1 tablet (10 mg total) by mouth daily., Disp:  30 tablet, Rfl: 11 .  losartan (COZAAR) 100 MG tablet, Take 1 tablet (100 mg total) by mouth daily., Disp: 90 tablet, Rfl: 3 .  Multiple Vitamin (MULTIVITAMIN) tablet, Take 1 tablet by mouth daily., Disp: , Rfl:  .  sitaGLIPtin ([name]) 50 MG tablet, Take 1 tablet (50 mg total) by mouth daily., Disp: 90 tablet, Rfl: 3 Social History   Socioeconomic History  . Marital status: Widowed    Spouse name: Not on file  . Number of children: Not on file  . Years of education: Not on file  . Highest education level: Not on file  Occupational History  . Not on file  Social Needs  . Financial resource strain: Not on file  . Food insecurity:    Worry: Not on file    Inability: Not on file  . Transportation needs:    Medical: Not on file    Non-medical: Not on file  Tobacco Use  . Smoking status: Former Smoker    Types: Cigarettes    Last attempt to quit: 02/05/1995    Years since quitting: 23.7  . Smokeless tobacco: Never Used  Substance and Sexual Activity  . Alcohol use: No    Alcohol/week: 0.0 standard drinks  . Drug use: No  . Sexual activity: Not on file  Lifestyle  . Physical  activity:    Days per week: Not on file    Minutes per session: Not on file  . Stress: Not on file  Relationships  . Social connections:    Talks on phone: Not on file    Gets together: Not on file    Attends religious service: Not on file    Active member of club or organization: Not on file    Attends meetings of clubs or organizations: Not on file    Relationship status: Not on file  . Intimate partner violence:    Fear of current or ex partner: Not on file    Emotionally abused: Not on file    Physically abused: Not on file    Forced sexual activity: Not on file  Other Topics Concern  . Not on file  Social History Narrative  . Not on file   Family History  Problem Relation Age of Onset  . Cancer Mother   . Heart disease Father        before age 25  . Heart attack Father   . Diabetes  Sister   . Diabetes Brother     Objective: Office vital signs reviewed. BP (!) 143/72   Pulse 85   Temp (!) 97 F (36.1 C) (Oral)   Ht 5' (1.524 m)   Wt 132 lb 6.4 oz (60.1 kg)   BMI 25.86 kg/m   Physical Examination:  General: Awake, alert, well nourished, No acute distress MSK: Patient able to ambulate independently but needs assistance with stepping up onto the exam table.  She has tenderness to palpation over the trochanteric bursa on the right.  No increased warmth, erythema noted.  No discoloration of the skin. Skin: A spotted tick noted on patient's right flank.  There is minimal surrounding erythema.  No appreciable target rashes.  INJECTION:  Patient denies allergy to antiseptics (including iodine) and anesthetics.  Patient has a h/o controlled diabetes.  Denies frequent steroid use, use of blood thinners/ antiplatelets.  Patient was given informed consent and a signed copy has been placed in the chart. Appropriate time out was taken. Area prepped and draped in usual sterile fashion. Anatomic landmarks were identified and injection site was marked.  Ethyl chloride spray was used to numb the area and 1 cc of methylprednisolone 40 mg/ml plus  3 cc of 1% lidocaine without epinephrine was injected into the right trochanteric bursa. The patient tolerated the procedure well and there were no immediate complications. Estimated blood loss is less than 1 cc.  Post procedure instructions were reviewed.  Foreign body removal:  Foreign body/tick was identified on patient's right flank.  There is minimal surrounding erythema.  Forceps were used to clasp of the head and gently pull the tick from the skin.  The entire tick successfully removed.  No immediate complications.  Home care instructions reviewed with the patient.  Assessment/ Plan: 85 y.o. female   1. Trochanteric bursitis, right hip Treated successfully with corticosteroid injection.  Home care instructions reviewed with  patient.  Reasons return discussed.  She will follow-up PRN - methylPREDNISolone acetate (DEPO-MEDROL) injection 40 mg  2. Tick bite, initial encounter Entire tick removed successfully.  Because we do not know how long the tick has actually been attached I have gone ahead and proceeded with prophylactic doxycycline dose for Lyme disease.  We discussed signs and symptoms of Lyme disease and indications for reevaluation.  She voiced good understanding will follow-up PRN - doxycycline (VIBRA-TABS) 100 MG tablet;  Take 2 tablets (200 mg total) by mouth once for 1 dose.  Dispense: 20 tablet; Refill: [place], [address] [phone]

## 2018-11-03 NOTE — Addendum Note (Signed)
Addended by: Janora Norlander on: 11/03/2018 12:46 PM   Modules accepted: Level of Service

## 2018-11-03 NOTE — Patient Instructions (Signed)
You were treated with a steroid injection today for your bursitis of the hip.  It may take a couple of days before you start noticing a big difference in your hip pain.  If you develop any other worrisome symptoms that we discussed, including redness, increased warmth or increased pain I would like to see you immediately in the office.  For the tick bite I have prescribed you doxycycline.  You will take 2 tablets as a single dose with supper.  Please make sure that you eat with this medication otherwise it will cause an upset stomach.  If you start developing fevers, flulike symptoms, joint pain or develop a large target rash, please seek immediate medical attention.

## 2018-11-11 ENCOUNTER — Ambulatory Visit (INDEPENDENT_AMBULATORY_CARE_PROVIDER_SITE_OTHER): Payer: Medicare Other | Admitting: Family Medicine

## 2018-11-11 ENCOUNTER — Other Ambulatory Visit: Payer: Self-pay

## 2018-11-11 ENCOUNTER — Encounter: Payer: Self-pay | Admitting: Family Medicine

## 2018-11-11 VITALS — BP 139/74 | HR 84 | Temp 97.7°F | Ht 60.0 in | Wt 129.0 lb

## 2018-11-11 DIAGNOSIS — S80821A Blister (nonthermal), right lower leg, initial encounter: Secondary | ICD-10-CM | POA: Diagnosis not present

## 2018-11-11 DIAGNOSIS — T148XXA Other injury of unspecified body region, initial encounter: Secondary | ICD-10-CM

## 2018-11-11 NOTE — Progress Notes (Addendum)
Subjective:  Patient ID: Maria Alexander, female    DOB: 08/01/1923, 85 y.o.   MRN: [id number]  Chief Complaint:  ? bite to right lower leg   HPI: Maria Alexander is a 85 y.o. female presenting on 11/11/2018 for ? bite to right lower leg  Pt presents today with a possible imbedded tick to her right lower leg. Pt states she noticed the spot on her leg a few days ago and it is not going away. She is concerned it is a tick. She denies injury. States she may have scratched at the area, but is not certain. She denies fever, chills, rash, weakness, confusion, arthralgias, myalgias, headache, diarrhea, nausea, or vomiting. No other associated symptoms.   Relevant past medical, surgical, family, and social history reviewed and updated as indicated.  Allergies and medications reviewed and updated.   Past Medical History:  Diagnosis Date  . Diabetes ([place])   . DVT (deep venous thrombosis) ([place])   . High cholesterol   . Hypothyroid   . Stroke [facility])     History reviewed. No pertinent surgical history.  Social History   Socioeconomic History  . Marital status: Widowed    Spouse name: Not on file  . Number of children: Not on file  . Years of education: Not on file  . Highest education level: Not on file  Occupational History  . Not on file  Social Needs  . Financial resource strain: Not on file  . Food insecurity:    Worry: Not on file    Inability: Not on file  . Transportation needs:    Medical: Not on file    Non-medical: Not on file  Tobacco Use  . Smoking status: Former Smoker    Types: Cigarettes    Last attempt to quit: 02/05/1995    Years since quitting: 23.7  . Smokeless tobacco: Never Used  Substance and Sexual Activity  . Alcohol use: No    Alcohol/week: 0.0 standard drinks  . Drug use: No  . Sexual activity: Not on file  Lifestyle  . Physical activity:    Days per week: Not on file    Minutes per session: Not on file  . Stress: Not on file  Relationships  .  Social connections:    Talks on phone: Not on file    Gets together: Not on file    Attends religious service: Not on file    Active member of club or organization: Not on file    Attends meetings of clubs or organizations: Not on file    Relationship status: Not on file  . Intimate partner violence:    Fear of current or ex partner: Not on file    Emotionally abused: Not on file    Physically abused: Not on file    Forced sexual activity: Not on file  Other Topics Concern  . Not on file  Social History Narrative  . Not on file    Outpatient Encounter Medications as of 11/11/2018  Medication Sig  . ALPRAZolam (XANAX) 1 MG tablet TAKE  (1)  TABLET TWICE A DAY. (Patient taking differently: TAKE  (1)  TABLET TWICE A DAY.  Patient states she takes at bedtime.)  . Aspirin-Acetaminophen-Caffeine (GOODY HEADACHE PO) Take by mouth every morning.  Marland Kitchen atorvastatin (LIPITOR) 80 MG tablet Take 1/2 tablet (40 mg total) by mouth daily.  Marland Kitchen doxycycline (VIBRA-TABS) 100 MG tablet   . glipiZIDE (GLIPIZIDE XL) 5 MG 24 hr tablet  Take 2 tablets (10 mg total) by mouth daily with breakfast.  . levothyroxine (SYNTHROID, LEVOTHROID) 50 MCG tablet Take 1 tablet (50 mcg total) by mouth daily before breakfast.  . loratadine (CLARITIN) 10 MG tablet Take 1 tablet (10 mg total) by mouth daily.  Marland Kitchen losartan (COZAAR) 100 MG tablet Take 1 tablet (100 mg total) by mouth daily.  . Multiple Vitamin (MULTIVITAMIN) tablet Take 1 tablet by mouth daily.  . sitaGLIPtin ([name]) 50 MG tablet Take 1 tablet (50 mg total) by mouth daily.   No facility-administered encounter medications on file as of 11/11/2018.     Allergies  Allergen Reactions  . Shellfish Allergy     hives    Review of Systems  Constitutional: Negative for activity change, appetite change, chills, diaphoresis, fatigue, fever and unexpected weight change.  Respiratory: Negative for cough, chest tightness, shortness of breath and wheezing.    Cardiovascular: Negative for chest pain, palpitations and leg swelling.  Gastrointestinal: Negative for abdominal pain, diarrhea, nausea and vomiting.  Musculoskeletal: Negative for arthralgias and myalgias.  Skin: Positive for wound.  Neurological: Negative for dizziness, weakness, light-headedness and headaches.  Psychiatric/Behavioral: Negative for confusion.  All other systems reviewed and are negative.       Objective:  BP 139/74   Pulse 84   Temp 97.7 F (36.5 C) (Oral)   Ht 5' (1.524 m)   Wt 129 lb (58.5 kg)   BMI 25.19 kg/m    Wt Readings from Last 3 Encounters:  11/11/18 129 lb (58.5 kg)  11/03/18 132 lb 6.4 oz (60.1 kg)  08/23/18 130 lb (59 kg)    Physical Exam Vitals signs and nursing note reviewed.  Constitutional:      General: She is not in acute distress.    Appearance: Normal appearance. She is not ill-appearing or toxic-appearing.  HENT:     Head: Normocephalic and atraumatic.     Mouth/Throat:     Mouth: Mucous membranes are moist.     Pharynx: Oropharynx is clear.  Eyes:     Conjunctiva/sclera: Conjunctivae normal.     Pupils: Pupils are equal, round, and reactive to light.  Neck:     Musculoskeletal: Neck supple.  Cardiovascular:     Rate and Rhythm: Normal rate and regular rhythm.     Pulses: Normal pulses.     Heart sounds: Normal heart sounds. No murmur. No friction rub. No gallop.   Pulmonary:     Effort: Pulmonary effort is normal. No respiratory distress.     Breath sounds: Normal breath sounds.  Abdominal:     General: Bowel sounds are normal.     Palpations: Abdomen is soft.  Lymphadenopathy:     Cervical: No cervical adenopathy.  Skin:    General: Skin is warm and dry.     Capillary Refill: Capillary refill takes less than 2 seconds.       Neurological:     General: No focal deficit present.     Mental Status: She is alert and oriented to person, place, and time.  Psychiatric:        Mood and Affect: Mood normal.         Behavior: Behavior normal.        Thought Content: Thought content normal.        Judgment: Judgment normal.      Results for orders placed or performed during the hospital encounter of 07/10/18  I-stat chem 8, ed  Result Value Ref Range   Sodium  136 135 - 145 mmol/L   Potassium 3.8 3.5 - 5.1 mmol/L   Chloride 101 98 - 111 mmol/L   BUN 16 8 - 23 mg/dL   Creatinine, Ser 0.80 0.44 - 1.00 mg/dL   Glucose, Bld 208 (H) 70 - 99 mg/dL   Calcium, Ion 1.24 1.15 - 1.40 mmol/L   TCO2 26 22 - 32 mmol/L   Hemoglobin 11.6 (L) 12.0 - 15.0 g/dL   HCT 34.0 (L) 36.0 - 46.0 %     I & D Date/Time: 11/12/2018 11:08 AM Performed by: Baruch Gouty, FNP Authorized by: Baruch Gouty, FNP   Consent:    Consent obtained:  Verbal   Consent given by:  Patient   Risks discussed:  Bleeding, infection, incomplete drainage and pain   Alternatives discussed:  No treatment Location:    Indications for incision and drainage: hemorrhagic blister.   Size:  0.5 cm   Location:  Lower extremity   Lower extremity location:  Leg   Leg location:  R lower leg Pre-procedure details:    Skin preparation:  Alcohol Anesthesia (see MAR for exact dosages):    Anesthesia method:  None Procedure type:    Complexity:  Simple Procedure details:    Needle aspiration: yes     Needle size:  20 G   Incision types:  Single straight   Incision depth:  Subcutaneous   Drainage:  Bloody   Drainage amount:  Scant   Wound treatment:  Wound left open   Packing materials:  None Post-procedure details:    Patient tolerance of procedure:  Tolerated well, no immediate complications Comments:     Pts name, DOB, procedure, and allergies verified prior to procedure.      Pertinent labs & imaging results that were available during my care of the patient were reviewed by me and considered in my medical decision making.  Assessment & Plan:  Makenli was seen today for ? bite to right lower leg.  Diagnoses and all orders for  this visit:  Blood blister Blood blister to right medial anterior lower leg. No foreign body identified. Blood drainaed from blister via needle puncture, scant amount. Bandage applied. Wound care discussed. Report any new or worsening symptoms.     Continue all other maintenance medications.  Follow up plan: Return if symptoms worsen or fail to improve.  Educational handout given for wound care  The above assessment and management plan was discussed with the patient. The patient verbalized understanding of and has agreed to the management plan. Patient is aware to call the clinic if symptoms persist or worsen. Patient is aware when to return to the clinic for a follow-up visit. Patient educated on when it is appropriate to go to the emergency department.   Monia Pouch, FNP-C [facility] [phone]

## 2018-11-11 NOTE — Patient Instructions (Signed)
Wound Care, Adult  Taking care of your wound properly can help to prevent pain, infection, and scarring. It can also help your wound to heal more quickly.  How to care for your wound  Wound care          Follow instructions from your health care provider about how to take care of your wound. Make sure you:  ? Wash your hands with soap and water before you change the bandage (dressing). If soap and water are not available, use hand sanitizer.  ? Change your dressing as told by your health care provider.  ? Leave stitches (sutures), skin glue, or adhesive strips in place. These skin closures may need to stay in place for 2 weeks or longer. If adhesive strip edges start to loosen and curl up, you may trim the loose edges. Do not remove adhesive strips completely unless your health care provider tells you to do that.   Check your wound area every day for signs of infection. Check for:  ? Redness, swelling, or pain.  ? Fluid or blood.  ? Warmth.  ? Pus or a bad smell.   Ask your health care provider if you should clean the wound with mild soap and water. Doing this may include:  ? Using a clean towel to pat the wound dry after cleaning it. Do not rub or scrub the wound.  ? Applying a cream or ointment. Do this only as told by your health care provider.  ? Covering the incision with a clean dressing.   Ask your health care provider when you can leave the wound uncovered.   Keep the dressing dry until your health care provider says it can be removed. Do not take baths, swim, use a hot tub, or do anything that would put the wound underwater until your health care provider approves. Ask your health care provider if you can take showers. You may only be allowed to take sponge baths.  Medicines     If you were prescribed an antibiotic medicine, cream, or ointment, take or use the antibiotic as told by your health care provider. Do not stop taking or using the antibiotic even if your condition improves.   Take  over-the-counter and prescription medicines only as told by your health care provider. If you were prescribed pain medicine, take it 30 or more minutes before you do any wound care or as told by your health care provider.  General instructions   Return to your normal activities as told by your health care provider. Ask your health care provider what activities are safe.   Do not scratch or pick at the wound.   Do not use any products that contain nicotine or tobacco, such as cigarettes and e-cigarettes. These may delay wound healing. If you need help quitting, ask your health care provider.   Keep all follow-up visits as told by your health care provider. This is important.   Eat a diet that includes protein, vitamin A, vitamin C, and other nutrient-rich foods to help the wound heal.  ? Foods rich in protein include meat, dairy, beans, nuts, and other sources.  ? Foods rich in vitamin A include carrots and dark green, leafy vegetables.  ? Foods rich in vitamin C include citrus, tomatoes, and other fruits and vegetables.  ? Nutrient-rich foods have protein, carbohydrates, fat, vitamins, or minerals. Eat a variety of healthy foods including vegetables, fruits, and whole grains.  Contact a health care provider if:     You received a tetanus shot and you have swelling, severe pain, redness, or bleeding at the injection site.   Your pain is not controlled with medicine.   You have redness, swelling, or pain around the wound.   You have fluid or blood coming from the wound.   Your wound feels warm to the touch.   You have pus or a bad smell coming from the wound.   You have a fever or chills.   You are nauseous or you vomit.   You are dizzy.  Get help right away if:   You have a red streak going away from your wound.   The edges of the wound open up and separate.   Your wound is bleeding, and the bleeding does not stop with gentle pressure.   You have a rash.   You faint.   You have trouble  breathing.  Summary   Always wash your hands with soap and water before changing your bandage (dressing).   To help with healing, eat foods that are rich in protein, vitamin A, vitamin C, and other nutrients.   Check your wound every day for signs of infection. Contact your health care provider if you suspect that your wound is infected.  This information is not intended to replace advice given to you by your health care provider. Make sure you discuss any questions you have with your health care provider.  Document Released: 03/24/2008 Document Revised: 07/27/2017 Document Reviewed: 12/31/2015  Elsevier Interactive Patient Education  2019 Elsevier Inc.

## 2018-12-06 ENCOUNTER — Other Ambulatory Visit: Payer: Self-pay | Admitting: Physician Assistant

## 2018-12-08 ENCOUNTER — Other Ambulatory Visit: Payer: Self-pay

## 2018-12-09 ENCOUNTER — Ambulatory Visit (INDEPENDENT_AMBULATORY_CARE_PROVIDER_SITE_OTHER): Payer: Medicare Other | Admitting: Physician Assistant

## 2018-12-09 ENCOUNTER — Encounter: Payer: Self-pay | Admitting: Physician Assistant

## 2018-12-09 VITALS — BP 175/77 | HR 68 | Temp 97.2°F | Ht 60.0 in | Wt 131.0 lb

## 2018-12-09 DIAGNOSIS — E7800 Pure hypercholesterolemia, unspecified: Secondary | ICD-10-CM

## 2018-12-09 DIAGNOSIS — E039 Hypothyroidism, unspecified: Secondary | ICD-10-CM | POA: Diagnosis not present

## 2018-12-09 DIAGNOSIS — E1159 Type 2 diabetes mellitus with other circulatory complications: Secondary | ICD-10-CM | POA: Insufficient documentation

## 2018-12-09 DIAGNOSIS — F419 Anxiety disorder, unspecified: Secondary | ICD-10-CM | POA: Diagnosis not present

## 2018-12-09 DIAGNOSIS — I10 Essential (primary) hypertension: Secondary | ICD-10-CM

## 2018-12-09 DIAGNOSIS — E1169 Type 2 diabetes mellitus with other specified complication: Secondary | ICD-10-CM | POA: Diagnosis not present

## 2018-12-09 LAB — BAYER DCA HB A1C WAIVED: HB A1C (BAYER DCA - WAIVED): 6.8 % (ref <7.0)

## 2018-12-09 LAB — CBC WITH DIFFERENTIAL/PLATELET

## 2018-12-09 MED ORDER — ALPRAZOLAM 1 MG PO TABS: ORAL_TABLET | ORAL | 5 refills | Status: DC

## 2018-12-09 MED ORDER — LEVOTHYROXINE SODIUM 50 MCG PO TABS: 50.0000 ug | ORAL_TABLET | Freq: Every day | ORAL | 11 refills | Status: DC

## 2018-12-09 MED ORDER — ATORVASTATIN CALCIUM 80 MG PO TABS: ORAL_TABLET | ORAL | 5 refills | Status: DC

## 2018-12-09 MED ORDER — LOSARTAN POTASSIUM 100 MG PO TABS: 100.0000 mg | ORAL_TABLET | Freq: Every day | ORAL | 3 refills | Status: DC

## 2018-12-09 NOTE — Progress Notes (Signed)
BP (!) 175/77   Pulse 68   Temp (!) 97.2 F (36.2 C) (Oral)   Ht 5' (1.524 m)   Wt 131 lb (59.4 kg)   BMI 25.58 kg/m    Subjective:    Patient ID: Maria Alexander, female    DOB: 1923/12/12, 85 y.o.   MRN: [id number]  HPI: Maria Alexander is a 85 y.o. female presenting on 12/09/2018 for Diabetes (6 month )  This patient comes in for follow-up of her chronic medical conditions.  She does have type 1 diabetes that has been well controlled.  Hypothyroidism, high cholesterol, anxiety and hypertension.  She does take alprazolam at bedtime and has done so for many years and has tolerated it well.  Her blood pressure is elevated today. It normally runs very good.  She denies any chest pain, shortness of breath, lower leg edema.  She does not check it at home but is going to try to start doing so.  She will check for the next few weeks and if she is not below 150/90 she is to call back for a medication to be added.  She states that overall she is feeling well and does need refills on her medications.  Foot exam 12/09/18 Lipid exam 11/1218 A1c   12/09/18 Kidneys  12/09/18 Eye exam 12/09/18  Past Medical History:  Diagnosis Date  . Diabetes ([place])   . DVT (deep venous thrombosis) ([place])   . High cholesterol   . Hypothyroid   . Stroke [facility])    Relevant past medical, surgical, family and social history reviewed and updated as indicated. Interim medical history since our last visit reviewed. Allergies and medications reviewed and updated. DATA REVIEWED: CHART IN EPIC  Family History reviewed for pertinent findings.  Review of Systems  Constitutional: Negative.  Negative for activity change, fatigue and fever.  HENT: Negative.   Eyes: Negative.   Respiratory: Negative.  Negative for cough, shortness of breath and wheezing.   Cardiovascular: Negative.  Negative for chest pain.  Gastrointestinal: Negative.  Negative for abdominal pain.  Endocrine: Negative.   Genitourinary: Negative.   Negative for dysuria.  Musculoskeletal: Positive for arthralgias, back pain and myalgias.  Skin: Negative.   Neurological: Negative.     Allergies as of 12/09/2018      Reactions   Shellfish Allergy    hives      Medication List       Accurate as of December 09, 2018  8:53 AM. If you have any questions, ask your nurse or doctor.        STOP taking these medications   doxycycline 100 MG tablet Commonly known as: VIBRA-TABS Stopped by: Terald Sleeper, PA-C     TAKE these medications   ALPRAZolam 1 MG tablet Commonly known as: XANAX TAKE  (1)  TABLET TWICE A DAY. What changed: additional instructions   atorvastatin 80 MG tablet Commonly known as: LIPITOR Take 1/2 tablet (40 mg total) by mouth daily.   glipiZIDE 5 MG 24 hr tablet Commonly known as: glipiZIDE XL Take 2 tablets (10 mg total) by mouth daily with breakfast.   GOODY HEADACHE PO Take by mouth every morning.   levothyroxine 50 MCG tablet Commonly known as: SYNTHROID Take 1 tablet (50 mcg total) by mouth daily before breakfast.   loratadine 10 MG tablet Commonly known as: CLARITIN Take 1 tablet (10 mg total) by mouth daily.   losartan 100 MG tablet Commonly known as: COZAAR Take 1  tablet (100 mg total) by mouth daily.   multivitamin tablet Take 1 tablet by mouth daily.   sitaGLIPtin 50 MG tablet Commonly known as: [name] Take 1 tablet (50 mg total) by mouth daily.          Objective:    BP (!) 175/77   Pulse 68   Temp (!) 97.2 F (36.2 C) (Oral)   Ht 5' (1.524 m)   Wt 131 lb (59.4 kg)   BMI 25.58 kg/m   Allergies  Allergen Reactions  . Shellfish Allergy     hives    Wt Readings from Last 3 Encounters:  12/09/18 131 lb (59.4 kg)  11/11/18 129 lb (58.5 kg)  11/03/18 132 lb 6.4 oz (60.1 kg)    Physical Exam Constitutional:      Appearance: She is well-developed.  HENT:     Head: Normocephalic and atraumatic.  Eyes:     Conjunctiva/sclera: Conjunctivae normal.     Pupils: Pupils  are equal, round, and reactive to light.  Cardiovascular:     Rate and Rhythm: Normal rate and regular rhythm.     Heart sounds: Normal heart sounds.  Pulmonary:     Effort: Pulmonary effort is normal.     Breath sounds: Normal breath sounds.  Abdominal:     General: Bowel sounds are normal.     Palpations: Abdomen is soft.  Skin:    General: Skin is warm and dry.     Findings: No rash.  Neurological:     Mental Status: She is alert and oriented to person, place, and time.     Deep Tendon Reflexes: Reflexes are normal and symmetric.  Psychiatric:        Behavior: Behavior normal.        Thought Content: Thought content normal.        Judgment: Judgment normal.     Results for orders placed or performed during the hospital encounter of 07/10/18  I-stat chem 8, ed  Result Value Ref Range   Sodium 136 135 - 145 mmol/L   Potassium 3.8 3.5 - 5.1 mmol/L   Chloride 101 98 - 111 mmol/L   BUN 16 8 - 23 mg/dL   Creatinine, Ser 0.80 0.44 - 1.00 mg/dL   Glucose, Bld 208 (H) 70 - 99 mg/dL   Calcium, Ion 1.24 1.15 - 1.40 mmol/L   TCO2 26 22 - 32 mmol/L   Hemoglobin 11.6 (L) 12.0 - 15.0 g/dL   HCT 34.0 (L) 36.0 - 46.0 %      Assessment & Plan:   1. Type 2 diabetes mellitus with other specified complication, without long-term current use of insulin (HCC) - CBC with Differential/Platelet - CMP14+EGFR - Lipid panel - Bayer DCA Hb A1c Waived  2. High cholesterol - CMP14+EGFR - Lipid panel - atorvastatin (LIPITOR) 80 MG tablet; Take 1/2 tablet (40 mg total) by mouth daily.  Dispense: 30 tablet; Refill: 5  3. Acquired hypothyroidism - TSH - levothyroxine (SYNTHROID) 50 MCG tablet; Take 1 tablet (50 mcg total) by mouth daily before breakfast.  Dispense: 30 tablet; Refill: 11  4. Anxiety - ALPRAZolam (XANAX) 1 MG tablet; TAKE  (1)  TABLET TWICE A DAY.  Dispense: 60 tablet; Refill: 5   Continue all other maintenance medications as listed above.  Follow up plan: Return in about 6  months (around 06/10/2019) for recheck medications.  Educational handout given for instructions  Terald Sleeper PA-C [place],  [place] [address] [phone]   12/09/2018, 8:53 AM

## 2018-12-09 NOTE — Patient Instructions (Signed)
Please have blood pressure readings over the next few weeks.  If it is not under 150/90 please give [place] a call back.

## 2018-12-11 LAB — SPECIMEN STATUS REPORT

## 2018-12-14 LAB — CBC WITH DIFFERENTIAL/PLATELET
Basophils Absolute: 0 10*3/uL (ref 0.0–0.2)
Basos: 1 %
EOS (ABSOLUTE): 0.1 10*3/uL (ref 0.0–0.4)
Eos: 1 %
Hematocrit: 38.2 % (ref 34.0–46.6)
Hemoglobin: 12.2 g/dL (ref 11.1–15.9)
Immature Grans (Abs): 0 10*3/uL (ref 0.0–0.1)
Immature Granulocytes: 0 %
Lymphocytes Absolute: 1.4 10*3/uL (ref 0.7–3.1)
Lymphs: 25 %
MCH: 30.8 pg (ref 26.6–33.0)
MCHC: 31.9 g/dL (ref 31.5–35.7)
MCV: 97 fL (ref 79–97)
Monocytes Absolute: 0.6 10*3/uL (ref 0.1–0.9)
Monocytes: 11 %
Neutrophils Absolute: 3.4 10*3/uL (ref 1.4–7.0)
Neutrophils: 62 %
Platelets: 157 10*3/uL (ref 150–450)
RBC: 3.96 x10E6/uL (ref 3.77–5.28)
RDW: 13.5 % (ref 11.7–15.4)
WBC: 5.5 10*3/uL (ref 3.4–10.8)

## 2018-12-14 LAB — CMP14+EGFR
ALT: 18 IU/L (ref 0–32)
AST: 18 IU/L (ref 0–40)
Albumin/Globulin Ratio: 2.3 — ABNORMAL HIGH (ref 1.2–2.2)
Albumin: 4.1 g/dL (ref 3.5–4.6)
Alkaline Phosphatase: 59 IU/L (ref 39–117)
BUN/Creatinine Ratio: 15 (ref 12–28)
BUN: 14 mg/dL (ref 10–36)
Bilirubin Total: 0.4 mg/dL (ref 0.0–1.2)
CO2: 25 mmol/L (ref 20–29)
Calcium: 9.5 mg/dL (ref 8.7–10.3)
Chloride: 102 mmol/L (ref 96–106)
Creatinine, Ser: 0.94 mg/dL (ref 0.57–1.00)
GFR calc Af Amer: 61 mL/min/{1.73_m2} (ref >59)
GFR calc non Af Amer: 53 mL/min/{1.73_m2} — ABNORMAL LOW (ref >59)
Globulin, Total: 1.8 g/dL (ref 1.5–4.5)
Glucose: 146 mg/dL — ABNORMAL HIGH (ref 65–99)
Potassium: 4.4 mmol/L (ref 3.5–5.2)
Sodium: 140 mmol/L (ref 134–144)
Total Protein: 5.9 g/dL — ABNORMAL LOW (ref 6.0–8.5)

## 2018-12-14 LAB — LIPID PANEL
Chol/HDL Ratio: 3.3 ratio (ref 0.0–4.4)
Cholesterol, Total: 183 mg/dL (ref 100–199)
HDL: 56 mg/dL (ref >39)
LDL Calculated: 107 mg/dL — ABNORMAL HIGH (ref 0–99)
Triglycerides: 102 mg/dL (ref 0–149)
VLDL Cholesterol Cal: 20 mg/dL (ref 5–40)

## 2018-12-14 LAB — MICROALBUMIN / CREATININE URINE RATIO
Creatinine, Urine: 95.2 mg/dL
Microalb/Creat Ratio: 4 mg/g creat (ref 0–29)
Microalbumin, Urine: 3.8 ug/mL

## 2018-12-14 LAB — TSH: TSH: 3 u[IU]/mL (ref 0.450–4.500)

## 2019-01-18 ENCOUNTER — Other Ambulatory Visit: Payer: Self-pay | Admitting: Pharmacy Technician

## 2019-01-18 NOTE — Patient Outreach (Signed)
[facility]) Care Management  01/18/2019  Maria Alexander 1924-06-04 [id number]   Incoming call from patient stating that she needs refills on her [name] and only has about 1 week left of medication.  01/19/2019  Call placed to [facility] put in an expedited request for patients refill and states medication to arrive at patients home on 7/24 via UPS delivery.  Successful call to patient, HIPAA identifiers verified, provided her with above details. Informed her that the refill information for this medication should be located on the bottle. Also informed her that [facility]) requests that patients call in when they have about 2 weeks left of medication.  [name] states that she will definitely hold on to the bottle this time.  [name] Chana Bode Pepin Certified Pharmacy Technician [place] Management Direct Dial:[phone]

## 2019-02-06 ENCOUNTER — Other Ambulatory Visit: Payer: Self-pay

## 2019-02-06 NOTE — Patient Outreach (Signed)
[place] [facility]) Care Management  02/06/2019  MALISA RUGGIERO February 17, 1924 [id number]   Medication Adherence call to Mrs. [place] Compliant Voice message left with a call back number. Mrs. Robeck is showing past due on Losartan 100 mg under [place].  [place] Management Direct Dial [phone]  Fax [phone] [email or web address]

## 2019-02-21 DIAGNOSIS — C44629 Squamous cell carcinoma of skin of left upper limb, including shoulder: Secondary | ICD-10-CM | POA: Diagnosis not present

## 2019-03-17 ENCOUNTER — Other Ambulatory Visit: Payer: Self-pay | Admitting: Physician Assistant

## 2019-03-17 DIAGNOSIS — E1169 Type 2 diabetes mellitus with other specified complication: Secondary | ICD-10-CM

## 2019-04-25 DIAGNOSIS — M545 Low back pain, unspecified: Secondary | ICD-10-CM | POA: Insufficient documentation

## 2019-04-25 DIAGNOSIS — M546 Pain in thoracic spine: Secondary | ICD-10-CM | POA: Insufficient documentation

## 2019-05-01 DIAGNOSIS — H353131 Nonexudative age-related macular degeneration, bilateral, early dry stage: Secondary | ICD-10-CM | POA: Diagnosis not present

## 2019-05-01 DIAGNOSIS — H04123 Dry eye syndrome of bilateral lacrimal glands: Secondary | ICD-10-CM | POA: Diagnosis not present

## 2019-05-01 DIAGNOSIS — E119 Type 2 diabetes mellitus without complications: Secondary | ICD-10-CM | POA: Diagnosis not present

## 2019-05-01 DIAGNOSIS — Z961 Presence of intraocular lens: Secondary | ICD-10-CM | POA: Diagnosis not present

## 2019-05-02 ENCOUNTER — Other Ambulatory Visit: Payer: Self-pay

## 2019-05-02 ENCOUNTER — Encounter: Payer: Self-pay | Admitting: Physical Therapy

## 2019-05-02 ENCOUNTER — Ambulatory Visit: Payer: Medicare Other | Attending: Orthopedic Surgery | Admitting: Physical Therapy

## 2019-05-02 DIAGNOSIS — R293 Abnormal posture: Secondary | ICD-10-CM | POA: Diagnosis not present

## 2019-05-02 DIAGNOSIS — M6281 Muscle weakness (generalized): Secondary | ICD-10-CM | POA: Insufficient documentation

## 2019-05-02 DIAGNOSIS — M546 Pain in thoracic spine: Secondary | ICD-10-CM | POA: Diagnosis not present

## 2019-05-02 NOTE — Therapy (Signed)
[facility] [place], [place], [address] Phone: [phone]   Fax:  [phone]  Physical Therapy Evaluation  Patient Details  Name: Maria Alexander MRN: [id number] Date of Birth: 12-28-1923 Referring Provider (PT): Latanya Maudlin, MD   Encounter Date: 05/02/2019  PT End of Session - 05/02/19 1117    Visit Number  1    Number of Visits  8    Date for PT Re-Evaluation  06/13/19    Authorization Type  progress note every 10th visit, KX modifier at 15th visit    PT Start Time  0900    PT Stop Time  0945    PT Time Calculation (min)  45 min    Activity Tolerance  Patient tolerated treatment well    Behavior During Therapy  [facility] for tasks assessed/performed       Past Medical History:  Diagnosis Date  . Diabetes ([place])   . DVT (deep venous thrombosis) ([place])   . High cholesterol   . Hypothyroid   . Stroke [facility])     History reviewed. No pertinent surgical history.  There were no vitals filed for this visit.   Subjective Assessment - 05/02/19 1111    Subjective  COVID-19 screening performed upon arrival.Patient arrives to physical therapy with reports of upper thoracic pain that has progressively worsened over the past year. 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 Patient reports she is able to perform all ADLs and home activities but pain after performing. Patient reports pain on average 5/10. Patient's goals are decrease pain, improve movement, and improve posture.    Pertinent History  Stroke, DM.    Diagnostic tests  X-ray.    Patient Stated Goals  Get out of pain.    Currently in Pain?  Yes    Pain Score  5     Pain Location  Back    Pain Orientation  Upper;Mid    Pain Descriptors / Indicators  Dull    Pain Type  Chronic pain    Pain Onset  More than a month ago    Pain Frequency  Constant    Aggravating Factors   "being up and doing things around the house"     Pain Relieving Factors  "resting"    Effect of Pain on Daily Activities  takes extra time to do activities.         [facility] PT Assessment - 05/02/19 0001      Assessment   Medical Diagnosis  Pain in thoracic spine    Referring Provider (PT)  Latanya Maudlin, MD    Onset Date/Surgical Date  --   over 1 year   Next MD Visit  n/a    Prior Therapy  no      Precautions   Precautions  Fall      Restrictions   Weight Bearing Restrictions  No      Balance Screen   Has the patient fallen in the past 6 months  Yes    How many times?  2    Has the patient had a decrease in activity level because of a fear of falling?   Yes    Is the patient reluctant to leave their home because of a fear of falling?   Yes      [place] residence    Living Arrangements  Alone      Prior  Function   Level of Independence  Independent      Posture/Postural Control   Posture/Postural Control  Postural limitations    Postural Limitations  Rounded Shoulders;Forward head;Increased thoracic kyphosis;Flexed trunk      ROM / Strength   AROM / PROM / Strength  Strength      Strength   Strength Assessment Site  Shoulder    Right/Left Shoulder  Right;Left    Right Shoulder Flexion  4-/5    Right Shoulder ABduction  3+/5    Right Shoulder Internal Rotation  4/5    Right Shoulder External Rotation  4/5    Left Shoulder Flexion  4-/5    Left Shoulder ABduction  4-/5    Left Shoulder Internal Rotation  4/5    Left Shoulder External Rotation  4/5      Palpation   Palpation comment  Tender to upper thoracic parasinals, rhomboids, and mid thoracic region. increased tone in paraspinals      Ambulation/Gait   Gait Pattern  Step-through pattern;Decreased stride length;Decreased step length - left;Decreased step length - right;Trunk flexed                Objective measurements completed on examination: See above findings.              PT Education  - 05/02/19 1113    Education Details  scapular retractions, shoulder rolls, draw ins, corner stretch, proper donning of brace provided by MD    Person(s) Educated  Patient    Methods  Explanation;Demonstration;Handout    Comprehension  Verbalized understanding;Returned demonstration       PT Short Term Goals - 08/29/18 1146      PT SHORT TERM GOAL #1   Title  STG's=LTG's.        PT Long Term Goals - 05/02/19 1120      PT LONG TERM GOAL #1   Title  Patient will be independent with HEP    Time  4    Period  Weeks    Status  New      PT LONG TERM GOAL #2   Title  Patient will demonstrate 4/5 or greater bilateral shoulder MMT in all planes to improve ability and stability while performing functional tasks.    Time  4    Period  Weeks    Status  New      PT LONG TERM GOAL #3   Title  Patient will report ability to perform ADLs with upper thoracic pain less than or equal to 3/10.    Time  4    Period  Weeks    Status  New      PT LONG TERM GOAL #4   Title  Patient will demonstrate independence and proper donning and doffing of brace.    Time  4    Period  Weeks    Status  New             Plan - 05/02/19 1118    Clinical Impression Statement  Patient arrives to physical therapy with reports of upper thoracic pain that has progressively worsened over the past year. 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 Patient reports she is able to perform all ADLs and home activities but pain after performing. Patient reports pain on average 5/10. Patient's goals are decrease pain, improve movement, and improve posture.    Personal Factors and Comorbidities  Age    Examination-Activity  Limitations  Bed Mobility    Examination-Participation Restrictions  Community Activity    Stability/Clinical Decision Making  Stable/Uncomplicated    Clinical Decision Making  Low    Rehab Potential  Excellent    PT Frequency  2x / week     PT Duration  4 weeks    PT Treatment/Interventions  ADLs/Self Care Home Management;Cryotherapy;Electrical Stimulation;Ultrasound;Moist Heat;Therapeutic activities;Therapeutic exercise;Patient/family education;Manual techniques;Passive range of motion;Neuromuscular re-education    PT Next Visit Plan  postural exercises, nustep, STW/M to upper thoracic, modalities PRN for pain relief    PT Home Exercise Plan  see patient education section    Consulted and Agree with Plan of Care  Patient       Patient will benefit from skilled therapeutic intervention in order to improve the following deficits and impairments:  Abnormal gait, Pain, Decreased activity tolerance, Decreased range of motion, Decreased strength  Visit Diagnosis: Pain in thoracic spine  Muscle weakness (generalized)  Abnormal posture     Problem List Patient Active Problem List   Diagnosis Date Noted  . Hypertension associated with diabetes ([place]) 12/09/2018  . Anxiety 11/29/2017  . Chronic hoarseness 01/15/2017  . Hypothyroidism 06/06/2014  . High cholesterol 06/06/2014  . Diabetes ([place]) 06/06/2014    Gabriela Eves, PT, DPT 05/02/2019, 2:14 PM  [facility] [place], [place], [address] Phone: [phone]   Fax:  [phone]  Name: Maria Alexander MRN: [id number] Date of Birth: 12/13/1923

## 2019-05-08 ENCOUNTER — Encounter: Payer: Self-pay | Admitting: Physical Therapy

## 2019-05-08 ENCOUNTER — Other Ambulatory Visit: Payer: Self-pay

## 2019-05-08 ENCOUNTER — Ambulatory Visit: Payer: Medicare Other | Admitting: Physical Therapy

## 2019-05-08 DIAGNOSIS — M6281 Muscle weakness (generalized): Secondary | ICD-10-CM

## 2019-05-08 DIAGNOSIS — M546 Pain in thoracic spine: Secondary | ICD-10-CM

## 2019-05-08 DIAGNOSIS — R293 Abnormal posture: Secondary | ICD-10-CM

## 2019-05-08 NOTE — Therapy (Signed)
[facility] [place], [place], [address] Phone: [phone]   Fax:  [phone]  Physical Therapy Treatment  Patient Details  Name: Maria Alexander MRN: [id number] Date of Birth: [birth year] Referring Provider (PT): Latanya Maudlin, MD   Encounter Date: 05/08/2019  PT End of Session - 05/08/19 0953    Visit Number  2    Number of Visits  8    Date for PT Re-Evaluation  06/13/19    Authorization Type  progress note every 10th visit, KX modifier at 15th visit    PT Start Time  0946    PT Stop Time  1031    PT Time Calculation (min)  45 min    Activity Tolerance  Patient tolerated treatment well    Behavior During Therapy  [facility] for tasks assessed/performed       Past Medical History:  Diagnosis Date  . Diabetes ([place])   . DVT (deep venous thrombosis) ([place])   . High cholesterol   . Hypothyroid   . Stroke [facility])     History reviewed. No pertinent surgical history.  There were no vitals filed for this visit.  Subjective Assessment - 05/08/19 0951    Subjective  COVID-19 screening performed upon arrival.  Patient arrives with pain all over the back. Reports she did not perform HEP and is not wearing brace provided by MD.    Pertinent History  Stroke, DM.    Diagnostic tests  X-ray.    Patient Stated Goals  Get out of pain.    Currently in Pain?  Yes    Pain Score  5     Pain Location  Back    Pain Orientation  Upper;Mid;Lower    Pain Descriptors / Indicators  Dull;Discomfort    Pain Type  Chronic pain    Pain Onset  More than a month ago    Pain Frequency  Constant         [facility] PT Assessment - 05/08/19 0001      Assessment   Medical Diagnosis  Pain in thoracic spine    Referring Provider (PT)  Latanya Maudlin, MD    Next MD Visit  n/a    Prior Therapy  no      Precautions   Precautions  Fall      Restrictions   Weight Bearing Restrictions  No                   [facility] Adult PT  Treatment/Exercise - 05/08/19 0001      Exercises   Exercises  Lumbar;Shoulder      Lumbar Exercises: Aerobic   Nustep  Level 3 x10 mins      Lumbar Exercises: Seated   Other Seated Lumbar Exercises  ball squeezes x20'; hamstring curls yellow x20 ; seated clam shells yellow x20    Other Seated Lumbar Exercises  --      Lumbar Exercises: Supine   Ab Set  20 reps;2 seconds      Shoulder Exercises: Supine   Horizontal ABduction  AROM;Both;20 reps      Shoulder Exercises: Seated   Row  Strengthening;Both;20 reps;Theraband    Theraband Level (Shoulder Row)  Level 1 (Yellow)    Horizontal ABduction  Strengthening;Both;20 reps;Theraband    Theraband Level (Shoulder Horizontal ABduction)  Level 1 (Yellow)      Modalities   Modalities  Electrical Stimulation;Moist Heat      Moist Heat Therapy   Number Minutes Moist  Heat  10 Minutes    Moist Heat Location  Lumbar Spine      Electrical Stimulation   Electrical Stimulation Location  thoracolumbar spine    Electrical Stimulation Action  IFC    Electrical Stimulation Parameters  80-150 hz x10 mins    Electrical Stimulation Goals  Pain               PT Short Term Goals - 08/29/18 1146      PT SHORT TERM GOAL #1   Title  STG's=LTG's.        PT Long Term Goals - 05/02/19 1120      PT LONG TERM GOAL #1   Title  Patient will be independent with HEP    Time  4    Period  Weeks    Status  New      PT LONG TERM GOAL #2   Title  Patient will demonstrate 4/5 or greater bilateral shoulder MMT in all planes to improve ability and stability while performing functional tasks.    Time  4    Period  Weeks    Status  New      PT LONG TERM GOAL #3   Title  Patient will report ability to perform ADLs with upper thoracic pain less than or equal to 3/10.    Time  4    Period  Weeks    Status  New      PT LONG TERM GOAL #4   Title  Patient will demonstrate independence and proper donning and doffing of brace.    Time  4     Period  Weeks    Status  New            Plan - 05/08/19 1028    Clinical Impression Statement  Patient arries to physical therapy with ongoing 5/10 upper and lower back pain and was able to tolerate treatment fairly well. Patient required throughout exercises for form and posture. Patient and PT discussed importance of performing HEP as well as trialing the brace provided by MD. Patient reported understanding. No adverse affects upon removal of modalities.    Personal Factors and Comorbidities  Age    Examination-Activity Limitations  Bed Mobility    Examination-Participation Restrictions  Community Activity    Stability/Clinical Decision Making  Stable/Uncomplicated    Clinical Decision Making  Low    Rehab Potential  Excellent    PT Frequency  2x / week    PT Duration  4 weeks    PT Treatment/Interventions  ADLs/Self Care Home Management;Cryotherapy;Electrical Stimulation;Ultrasound;Moist Heat;Therapeutic activities;Therapeutic exercise;Patient/family education;Manual techniques;Passive range of motion;Neuromuscular re-education    PT Next Visit Plan  postural exercises, nustep, STW/M to upper thoracic, modalities PRN for pain relief    Consulted and Agree with Plan of Care  Patient       Patient will benefit from skilled therapeutic intervention in order to improve the following deficits and impairments:  Abnormal gait, Pain, Decreased activity tolerance, Decreased range of motion, Decreased strength  Visit Diagnosis: Pain in thoracic spine  Muscle weakness (generalized)  Abnormal posture     Problem List Patient Active Problem List   Diagnosis Date Noted  . Hypertension associated with diabetes ([place]) 12/09/2018  . Anxiety 11/29/2017  . Chronic hoarseness 01/15/2017  . Hypothyroidism 06/06/2014  . High cholesterol 06/06/2014  . Diabetes ([place]) 06/06/2014    Gabriela Eves, PT, DPT 05/08/2019, 12:43 PM  [facility]  [place], [place], [address] Phone: [phone]   Fax:  [phone]  Name: MARQUEISHA KIERCE MRN: [id number] Date of Birth: [birth year]

## 2019-05-15 ENCOUNTER — Ambulatory Visit: Payer: Medicare Other | Admitting: Physical Therapy

## 2019-05-15 ENCOUNTER — Encounter: Payer: Self-pay | Admitting: Physical Therapy

## 2019-05-15 ENCOUNTER — Other Ambulatory Visit: Payer: Self-pay

## 2019-05-15 DIAGNOSIS — R293 Abnormal posture: Secondary | ICD-10-CM

## 2019-05-15 DIAGNOSIS — M6281 Muscle weakness (generalized): Secondary | ICD-10-CM | POA: Diagnosis not present

## 2019-05-15 DIAGNOSIS — M546 Pain in thoracic spine: Secondary | ICD-10-CM

## 2019-05-15 NOTE — Therapy (Signed)
[facility] [place], [place], [address] Phone: [phone]   Fax:  [phone]  Physical Therapy Treatment  Patient Details  Name: Maria Alexander MRN: [id number] Date of Birth: 1923/10/16 Referring Provider (PT): Latanya Maudlin, MD   Encounter Date: 05/15/2019  PT End of Session - 05/15/19 1018    Visit Number  3    Number of Visits  8    Date for PT Re-Evaluation  06/13/19    Authorization Type  progress note every 10th visit, KX modifier at 15th visit    PT Start Time  0946    PT Stop Time  1029    PT Time Calculation (min)  43 min    Activity Tolerance  Patient tolerated treatment well    Behavior During Therapy  [facility] for tasks assessed/performed       Past Medical History:  Diagnosis Date  . Diabetes ([place])   . DVT (deep venous thrombosis) ([place])   . High cholesterol   . Hypothyroid   . Stroke [facility])     History reviewed. No pertinent surgical history.  There were no vitals filed for this visit.  Subjective Assessment - 05/15/19 0954    Subjective  COVID-19 screening performed upon arrival.  Patient arrives with pain all over the back. Reports doing ok today and well after last treatment    Pertinent History  Stroke, DM.    Diagnostic tests  X-ray.    Patient Stated Goals  Get out of pain.    Currently in Pain?  Yes    Pain Score  5     Pain Location  Back    Pain Orientation  Mid;Upper;Lower    Pain Descriptors / Indicators  Discomfort    Pain Type  Chronic pain    Pain Onset  More than a month ago    Aggravating Factors   certain bending movements    Pain Relieving Factors  at rest                       [facility] Adult PT Treatment/Exercise - 05/15/19 0001      Exercises   Exercises  Lumbar;Shoulder;Knee/Hip      Lumbar Exercises: Aerobic   Nustep  Level 3 x11 1/38mins UE/LE activity, monitored      Lumbar Exercises: Supine   Ab Set  20 reps;2 seconds    Glut Set  20 reps;2 seconds     Clam  20 reps    Clam Limitations  yellow t-band    Straight Leg Raise  10 reps;2 seconds      Knee/Hip Exercises: Seated   Ball Squeeze  x30    Hamstring Curl  Strengthening;Both;20 reps    Hamstring Limitations  yellow tband      Shoulder Exercises: Seated   Row  Strengthening;Both;20 reps;Theraband    Theraband Level (Shoulder Row)  Level 1 (Yellow)    Horizontal ABduction  Strengthening;Both;20 reps;Theraband    Theraband Level (Shoulder Horizontal ABduction)  Level 1 (Yellow)      Moist Heat Therapy   Number Minutes Moist Heat  10 Minutes    Moist Heat Location  Lumbar Spine      Electrical Stimulation   Electrical Stimulation Location  thoracolumbar spine    Electrical Stimulation Action  IFC    Electrical Stimulation Parameters  80-150hz  x64min    Electrical Stimulation Goals  Pain  PT Long Term Goals - 05/15/19 1020      PT LONG TERM GOAL #1   Title  Patient will be independent with HEP    Time  4    Period  Weeks    Status  On-going      PT LONG TERM GOAL #2   Title  Patient will demonstrate 4/5 or greater bilateral shoulder MMT in all planes to improve ability and stability while performing functional tasks.    Time  4    Period  Weeks    Status  On-going      PT LONG TERM GOAL #3   Title  Patient will report ability to perform ADLs with upper thoracic pain less than or equal to 3/10.    Time  4    Period  Weeks    Status  On-going      PT LONG TERM GOAL #4   Title  Patient will demonstrate independence and proper donning and doffing of brace.    Time  4    Period  Weeks    Status  On-going            Plan - 05/15/19 1021    Clinical Impression Statement  Patient tolerated treatment well today. patient able to progress with exercises and no increased pain reported. Patient reported no more falls at this time. Patient continues to have some pain in back with certain movements. Patient unable to get her back brace on  fully independent at this time.    Personal Factors and Comorbidities  Age    Examination-Activity Limitations  Bed Mobility    Examination-Participation Restrictions  Community Activity    Stability/Clinical Decision Making  Stable/Uncomplicated    Rehab Potential  Excellent    PT Frequency  2x / week    PT Duration  4 weeks    PT Treatment/Interventions  ADLs/Self Care Home Management;Cryotherapy;Electrical Stimulation;Ultrasound;Moist Heat;Therapeutic activities;Therapeutic exercise;Patient/family education;Manual techniques;Passive range of motion;Neuromuscular re-education    PT Next Visit Plan  cont with POC for postural exercises, nustep, STW/M to upper thoracic, modalities PRN for pain relief    Consulted and Agree with Plan of Care  Patient       Patient will benefit from skilled therapeutic intervention in order to improve the following deficits and impairments:  Abnormal gait, Pain, Decreased activity tolerance, Decreased range of motion, Decreased strength  Visit Diagnosis: Pain in thoracic spine  Muscle weakness (generalized)  Abnormal posture     Problem List Patient Active Problem List   Diagnosis Date Noted  . Hypertension associated with diabetes ([place]) 12/09/2018  . Anxiety 11/29/2017  . Chronic hoarseness 01/15/2017  . Hypothyroidism 06/06/2014  . High cholesterol 06/06/2014  . Diabetes ([place]) 06/06/2014    Morningstar Toft P, PTA 05/15/2019, 10:37 AM  [facility] [place], [place], [address] Phone: [phone]   Fax:  [phone]  Name: Maria Alexander MRN: [id number] Date of Birth: 01/19/1924

## 2019-05-31 ENCOUNTER — Telehealth: Payer: Self-pay | Admitting: Physician Assistant

## 2019-05-31 NOTE — Chronic Care Management (AMB) (Signed)
°  Chronic Care Management   Outreach Note  05/31/2019 Name: Maria Alexander MRN: [id number] DOB: 06-21-1924  Referred by: Terald Sleeper, PA-C Reason for referral : Chronic Care Management (Initial CCM outreach was unsuccessful. )   An unsuccessful telephone outreach was attempted today. The patient was referred to the case management team by for assistance with care management and care coordination.   Follow Up Plan: The care management team will reach out to the patient again over the next 7 days.   [place], [place] [address] Direct Dial: [email or web address]  Website: [email or web address]

## 2019-06-07 NOTE — Chronic Care Management (AMB) (Signed)
Chronic Care Management   Note  06/07/2019 Name: Maria Alexander MRN: [id number] DOB: 1923/12/24  Maria Alexander is a 85 y.o. year old female who is a primary care patient of Terald Sleeper, PA-C. I reached out to [name] by phone today in response to a referral sent by Ms. Sunday Corn [facility] [employer] health plan.     Ms. Moffat was given information about Chronic Care Management services today including:  1. CCM service includes personalized support from designated clinical staff supervised by her physician, including individualized plan of care and coordination with other care providers 2. 24/7 contact phone numbers for assistance for urgent and routine care needs. 3. Service will only be billed when office clinical staff spend 20 minutes or more in a month to coordinate care. 4. Only one practitioner may furnish and bill the service in a calendar month. 5. The patient may stop CCM services at any time (effective at the end of the month) by phone call to the office staff. 6. The patient will be responsible for cost sharing (co-pay) of up to 20% of the service fee (after annual deductible is met).  Patient agreed to services and verbal consent obtained.   Follow up plan: Telephone appointment with CCM team member scheduled for: 07/04/2019  [facility] [facility]  [place], [place] [address] Direct Dial: [email or web address]  Website: [email or web address]

## 2019-06-19 ENCOUNTER — Other Ambulatory Visit: Payer: Self-pay | Admitting: Physician Assistant

## 2019-06-19 DIAGNOSIS — E1169 Type 2 diabetes mellitus with other specified complication: Secondary | ICD-10-CM

## 2019-07-04 ENCOUNTER — Ambulatory Visit (INDEPENDENT_AMBULATORY_CARE_PROVIDER_SITE_OTHER): Payer: Medicare Other | Admitting: *Deleted

## 2019-07-04 DIAGNOSIS — E1169 Type 2 diabetes mellitus with other specified complication: Secondary | ICD-10-CM

## 2019-07-04 DIAGNOSIS — E7800 Pure hypercholesterolemia, unspecified: Secondary | ICD-10-CM

## 2019-07-04 DIAGNOSIS — E1159 Type 2 diabetes mellitus with other circulatory complications: Secondary | ICD-10-CM

## 2019-07-04 NOTE — Patient Instructions (Signed)
Visit Information  Goals Addressed            This Visit's Progress     Patient Stated   . Covid Vaccine (pt-stated)       Request for Covid vaccine in patient with diabetes, hypertension and advanced age.   Current Barriers:  Marland Kitchen Knowledge Deficits related to procedure for obtaining Covid Vaccine.   Nurse Case Manager Clinical Goal(s):  Marland Kitchen Over the next 30 days, patient will be informed of how/when to receive Covid vaccine  Interventions:  . Discussed Covid vaccine . Placed on list to call when more information is available about vaccines at [facility]  Patient Self Care Activities:  . Performs ADL's independently . Performs IADL's independently  Initial goal documentation       Other   . Chronic Disease Management Needs       Current Barriers:  . Chronic Disease Management support, education, and care coordination needs related to HTN, DM, hypothyroidism, hyperlipidemia, anxiety  Clinical Goal(s) related to HTN, DM, hypothyroidism, hyperlipidemia, anxiety:  Over the next 90 days, patient will:  . Work with the care management team to address educational, disease management, and care coordination needs  . Call provider office for new or worsened signs and symptoms  . Call care management team with questions or concerns . Verbalize basic understanding of patient centered plan of care established today  Interventions related to HTN, DM, hypothyroidism, hyperlipidemia, anxiety:  . Evaluation of current treatment plans and patient's adherence to plan as established by provider . Assessed patient understanding of disease states . Assessed patient's education and care coordination needs . Provided disease specific education to patient  . Collaborated with appropriate clinical care team members regarding patient needs  Patient Self Care Activities related to HTN, DM, hypothyroidism, hyperlipidemia, anxiety:  . Patient is unable to independently self-manage chronic health  conditions  Initial goal documentation     . Exercise 150 min/wk Moderate Activity       Need for increased activity level in patient with diabetes, hypertension, and hyperlipidemia.   Current Barriers:  Marland Kitchen Knowledge Deficits related to how to safely increase activity level  Nurse Case Manager Clinical Goal(s):  Marland Kitchen Over the next 30 days, patient will slowly increase activity level as tolerated with a goal of 150 min of moderate activity a week . Over the next 90 days, patient will work with Consulting civil engineer to develop an exercise plan  Interventions:  . Provided education to patient re: increasing physical activity level . Discussed plans with patient for ongoing care management follow up and provided patient with direct contact information for care management team . RN will mail printed resources for review  Patient Self Care Activities:  . Performs ADL's independently . Performs IADL's independently  Initial goal documentation        Print copy of patient instructions provided. (Mailed)  The care management team will reach out to the patient again over the next 45 days.    Ms. Koors was given information about Chronic Care Management services today including:  1. CCM service includes personalized support from designated clinical staff supervised by her physician, including individualized plan of care and coordination with other care providers 2. 24/7 contact phone numbers for assistance for urgent and routine care needs. 3. Service will only be billed when office clinical staff spend 20 minutes or more in a month to coordinate care. 4. Only one practitioner may furnish and bill the service in a calendar month. 5.  The patient may stop CCM services at any time (effective at the end of the month) by phone call to the office staff. 6. The patient will be responsible for cost sharing (co-pay) of up to 20% of the service fee (after annual deductible is met).  Patient agreed to  services and verbal consent obtained.   Chong Sicilian, BSN, RN-BC Embedded Chronic Care Manager [facility] [place] [facility] Direct Dial: [phone]   The patient verbalized understanding of instructions provided today and declined a print copy of patient instruction materials.    8 Easy Exercises You Can Do Sitting Down  Got a chair? Then you're ready for this sit-down, total-body workout!.  Safety precaution: Pay attention to your body during the movements -- if anything hurts or causes pain, stop immediately. And check with your doctor first before beginning this, or any, exercise program.   Sunshine Arm Circles Seated in a chair with good posture, hold a ball in both hands with arms extended above your head and/or in front of you, keeping elbows slightly bent. Visualizing the face of a clock out in front of you, begin by holding arms up overhead at 12 o'clock. Circle the ball around to go all the way around the clock in a controlled, fluid motion. When you've reached 12 o'clock again, reverse directions and circle the opposite way. Keep alternating circle directions for 8 repetitions. Rest. Do another set of 8 repetitions.  Modification: A ball is not required for this exercise. Imagine that you are holding a ball while performing the motion. If it is difficult to bring your arms overhead, extend them out in front of you and move arms as if drawing a circle on the wall with or without the ball.   Tummy Twists Seated in a chair with good posture, hold a ball with both hands close to the body, with elbows bent and pulled in close to the ribcage. Slowly rotate your torso to the right as far as you comfortably can, being sure to keep the rest of your body still and stable. Rotate back to the center and repeat in the opposite direction. Do this 8 times, with two twists counting as a full set. Rest. Do another 8 sets (two twists each). Modification: A  ball is not required for this exercise. Imagine you are holding a ball while performing the motion, or hold a small object such as a can of soup or water bottle to add resistance   Ball Chest Press Seated in a chair with good posture, hold a ball with both hands at chest level, palms facing toward each other and elbows bent. Avoid bending forward by keeping your shoulders back at all times. Squeeze the ball slightly as you push the ball away from you in a fluid motion, taking about 2 seconds to extend the arms. Squeeze your shoulder blades together as you pull the ball back toward your chest. Repeat the push and pull motion 10 to 15 times. Rest. Do another set of 10 to 15 repetitions.  Modification: For a greater challenge, add a Tai Chi feel by standing with one leg slightly in front of the other (with a chair nearby if needed for extra balance) and slowly rocking the entire body forward and back as you push the ball away and pull back in.     Front Arm Raises In a seated position with good posture, hold a ball in both hands with palms facing each other. Extend the  arms out in front of your body, keeping your elbows slightly bent. Starting with the ball lowered toward the knees, slowly raise your arms to lift the ball up to shoulder level (no higher), then lower the ball back to the starting position, taking about 2 to 3 seconds to lift and lower. Repeat 10 to 15 times. Rest. Do another set of 10 to 15 repetitions. Modification: A ball is not required for this exercise. Imagine you are holding a ball as you perform the motion, or hold a small object, such as a can of soup or water bottle for added resistance.        Inner Thigh Squeezes Sitting toward the edge of a chair with good posture and knees bent, place a ball in between your knees; press the knees together to squeeze the ball, taking about 1 to 2 seconds to squeeze. You should feel the resistance in your inner thighs.  Slowly release, keeping slight tension on the ball so that it does not fall. Repeat 8 to 10 times. Rest. Do another set of 8 to 10 repetitions. Modification: For a greater challenge, change the count of the squeezes by squeezing the ball and holding for 5 seconds, then releasing again. Or, do short, quick pulsing squeezes.     Knee Extensions Sitting toward the edge of a chair with good posture and bent knees, hold on to the sides of the chair with your hands. Extend the right knee out so that the toes come up toward the ceiling, being sure to keep the knee slightly bent without locking it through the entire movement. Lower the leg back to a bent position and repeat this movement 8 to 10 times, using about 2 seconds each to lift and lower the leg. Switch to the opposite leg and perform 8 to 10 repetitions. Rest briefly. Do another set of 8 to 10 repetitions for each leg. Modification: If you are more advanced, sitting in the same position as above, extend one leg out in front of you with toes pointed to the ceiling. Lift and lower the entire leg only as high as you comfortably can, keeping the knee slightly bent. The longer lever adds difficulty to the exercise.   Elbow to Knee Seated toward the edge of a chair with good posture and knees bent, start with your right arm extended up overhead. Slowly lift the left knee up as you lower your right elbow down toward your left knee, taking about 2 seconds to lower down. Try not to bend over at the waist. Release and go back to the starting position. Repeat 8 to 10 times. Switch sides and do 8 to 10 repetitions, pulling one elbow to the opposite knee. Rest. Do another set of 8 to 10 repetitions on each side. Modification: Try this (with a chair nearby for balance) exercise in a standing position for an increased range of motion.     Overhead Arm Extensions Seated in a chair with good posture, hold a ball with both hands and raise it up  over your head, with arms extended without locking the elbows. Keeping the elbows pulled in toward the head, slowly bend the elbows to lower the ball down along the back of the neck, using about 2 seconds to go down, then 2 seconds to push the ball back up over your head. Repeat 8 to 10 times. Rest. Do another set of 8 to 10 repetitions. Modification: Try seated tricep extensions (ball not required for this modification).  Bending slightly forward with elbows tucked into your sides, slowly extend the elbows so that your forearms go back behind you, keeping the elbows pulled up and in for the entire movement. Return to the starting position and repeat. Hold soup cans or small weights for added resistance.

## 2019-07-04 NOTE — Chronic Care Management (AMB) (Signed)
Chronic Care Management   Initial Visit Note  07/04/2019 Name: Maria Alexander MRN: [id number] DOB: December 29, 1923  Referred by: Terald Sleeper, PA-C Reason for referral : Chronic Care Management (RN Initial Outreach)   Maria Alexander is a 86 y.o. year old female who is a primary care patient of Terald Sleeper, PA-C. The CCM team was consulted for assistance with chronic disease management and care coordination needs related to HTN, DM, hypothyroidism, hyperlipidemia, anxiety.  Review of patient status, including review of consultants reports, relevant laboratory and other test results, and collaboration with appropriate care team members and the patient's provider was performed as part of comprehensive patient evaluation and provision of chronic care management services.    SDOH (Social Determinants of Health) screening performed today: Physical Activity. See Care Plan for related entries.    Subjective: I spoke with Maria Alexander by telephone today. She is looking forward to working with the CCM team to help manage her chronic medical conditions.   Objective: Outpatient Encounter Medications as of 07/04/2019  Medication Sig  . ALPRAZolam (XANAX) 1 MG tablet TAKE  (1)  TABLET TWICE A DAY.  Marland Kitchen Aspirin-Acetaminophen-Caffeine (GOODY HEADACHE PO) Take by mouth every morning.  Marland Kitchen atorvastatin (LIPITOR) 80 MG tablet Take 1/2 tablet (40 mg total) by mouth daily.  Marland Kitchen glipiZIDE (GLUCOTROL XL) 5 MG 24 hr tablet TAKE 2 TABLETS ONCE DAILY WITH BREAKFAST  . levothyroxine (SYNTHROID) 50 MCG tablet Take 1 tablet (50 mcg total) by mouth daily before breakfast.  . loratadine (CLARITIN) 10 MG tablet Take 1 tablet (10 mg total) by mouth daily.  Marland Kitchen losartan (COZAAR) 100 MG tablet Take 1 tablet (100 mg total) by mouth daily.  . Multiple Vitamin (MULTIVITAMIN) tablet Take 1 tablet by mouth daily.  . sitaGLIPtin ([name]) 50 MG tablet Take 1 tablet (50 mg total) by mouth daily.   No facility-administered encounter  medications on file as of 07/04/2019.     RN Assessment & Care Plan   . Covid Vaccine (pt-stated)       Request for Covid vaccine in patient with diabetes, hypertension and advanced age.   Current Barriers:  Marland Kitchen Knowledge Deficits related to procedure for obtaining Covid Vaccine.   Nurse Case Manager Clinical Goal(s):  Marland Kitchen Over the next 30 days, patient will be informed of how/when to receive Covid vaccine  Interventions:  . Discussed Covid vaccine . Placed on list to call when more information is available about vaccines at [facility]  Patient Self Care Activities:  . Performs ADL's independently . Performs IADL's independently  Initial goal documentation       . Chronic Disease Management Needs       Current Barriers:  . Chronic Disease Management support, education, and care coordination needs related to HTN, DM, hypothyroidism, hyperlipidemia, anxiety  Clinical Goal(s) related to HTN, DM, hypothyroidism, hyperlipidemia, anxiety:  Over the next 90 days, patient will:  . Work with the care management team to address educational, disease management, and care coordination needs  . Call provider office for new or worsened signs and symptoms  . Call care management team with questions or concerns . Verbalize basic understanding of patient centered plan of care established today  Interventions related to HTN, DM, hypothyroidism, hyperlipidemia, anxiety:  . Evaluation of current treatment plans and patient's adherence to plan as established by provider . Assessed patient understanding of disease states . Assessed patient's education and care coordination needs . Provided disease specific education to patient  . Collaborated with  appropriate clinical care team members regarding patient needs  Patient Self Care Activities related to HTN, DM, hypothyroidism, hyperlipidemia, anxiety:  . Patient is unable to independently self-manage chronic health conditions  Initial goal documentation      . Exercise 150 min/wk Moderate Activity       Need for increased activity level in patient with diabetes, hypertension, and hyperlipidemia.   Current Barriers:  Marland Kitchen Knowledge Deficits related to how to safely increase activity level  Nurse Case Manager Clinical Goal(s):  Marland Kitchen Over the next 30 days, patient will slowly increase activity level as tolerated with a goal of 150 min of moderate activity a week . Over the next 90 days, patient will work with Consulting civil engineer to develop an exercise plan  Interventions:  . Provided education to patient re: increasing physical activity level . Discussed plans with patient for ongoing care management follow up and provided patient with direct contact information for care management team . RN will mail printed resources for review  Patient Self Care Activities:  . Performs ADL's independently . Performs IADL's independently  Initial goal documentation        Follow-up Plan:  CCM team will follow-up with patient by telephone over the next 45 days.   Chong Sicilian, BSN, RN-BC Embedded Chronic Care Manager [facility] [place] [facility] Direct Dial: [phone]

## 2019-07-07 ENCOUNTER — Ambulatory Visit: Payer: Medicare Other | Admitting: *Deleted

## 2019-07-07 NOTE — Chronic Care Management (AMB) (Signed)
  Chronic Care Management   Outreach Note  07/07/2019 Name: Maria Alexander MRN: [id number] DOB: May 15, 1924  Referred by: Terald Sleeper, PA-C Reason for referral : Chronic Care Management (Follow up)   An unsuccessful telephone outreach was attempted today to let [name] know that the [facility] Dept is offering Covid vaccines on 07/11/19, 07/13/19, 07/18/19, & 07/20/19 from 10:00 until 2:00 at the [facility].    Follow Up Plan: The care management team will reach out to the patient again over the next 7 days.   Chong Sicilian, BSN, RN-BC Embedded Chronic Care Manager [facility] [place] [facility] Direct Dial: [phone]

## 2019-07-08 ENCOUNTER — Other Ambulatory Visit: Payer: Self-pay | Admitting: Physician Assistant

## 2019-07-08 DIAGNOSIS — F419 Anxiety disorder, unspecified: Secondary | ICD-10-CM

## 2019-07-10 ENCOUNTER — Other Ambulatory Visit: Payer: Self-pay

## 2019-07-10 ENCOUNTER — Ambulatory Visit: Payer: Medicare Other | Admitting: *Deleted

## 2019-07-10 DIAGNOSIS — E1169 Type 2 diabetes mellitus with other specified complication: Secondary | ICD-10-CM | POA: Diagnosis not present

## 2019-07-10 DIAGNOSIS — I10 Essential (primary) hypertension: Secondary | ICD-10-CM | POA: Diagnosis not present

## 2019-07-10 DIAGNOSIS — E1159 Type 2 diabetes mellitus with other circulatory complications: Secondary | ICD-10-CM | POA: Diagnosis not present

## 2019-07-10 DIAGNOSIS — E7800 Pure hypercholesterolemia, unspecified: Secondary | ICD-10-CM | POA: Diagnosis not present

## 2019-07-10 NOTE — Patient Instructions (Signed)
Visit Information  Goals Addressed            This Visit's Progress     Patient Stated   . COMPLETED: Covid Vaccine (pt-stated)       Request for Covid vaccine in patient with diabetes, hypertension and advanced age.   Current Barriers:  Marland Kitchen Knowledge Deficits related to procedure for obtaining Covid Vaccine.   Nurse Case Manager Clinical Goal(s):  Marland Kitchen Over the next 7 days, patient will present to the [facility] Dept for covid Vaccine  Interventions:  . Discussed vaccine . Provider information on vaccine clinic at health dept . Asked patient to call and schedule an appointment [phone]  Patient Self Care Activities:  . Performs ADL's independently . Performs IADL's independently  Please see past updates related to this goal by clicking on the "Past Updates" button in the selected goal        The care management team will reach out to the patient again over the next 30 days.    Chong Sicilian, BSN, RN-BC Embedded Chronic Care Manager [facility] [place] [facility] Direct Dial: [phone]   The patient verbalized understanding of instructions provided today and declined a print copy of patient instruction materials.

## 2019-07-10 NOTE — Chronic Care Management (AMB) (Signed)
  Chronic Care Management   Follow Up Note   07/10/2019 Name: [name] MRN: [id number] DOB: [birth year]  Referred by: Terald Sleeper, PA-C Reason for referral : Chronic Care Management (RN follow up)   Maria Alexander is a 86 y.o. year old female who is a primary care patient of Terald Sleeper, PA-C. The CCM team was consulted for assistance with chronic disease management and care coordination needs.    Review of patient status, including review of consultants reports, relevant laboratory and other test results, and collaboration with appropriate care team members and the patient's provider was performed as part of comprehensive patient evaluation and provision of chronic care management services.     RN Care Plan   . COMPLETED: Covid Vaccine (pt-stated)       Request for Covid vaccine in patient with diabetes, hypertension and advanced age.   Current Barriers:  Marland Kitchen Knowledge Deficits related to procedure for obtaining Covid Vaccine.   Nurse Case Manager Clinical Goal(s):  Marland Kitchen Over the next 7 days, patient will present to the [facility] Dept for covid Vaccine  Interventions:  . Discussed vaccine . Provider information on vaccine clinic at health dept . Asked patient to call and schedule an appointment [phone]  Patient Self Care Activities:  . Performs ADL's independently . Performs IADL's independently  Please see past updates related to this goal by clicking on the "Past Updates" button in the selected goal         Follow-up Plan The care management team will reach out to the patient again over the next 30 days.    Chong Sicilian, BSN, RN-BC Embedded Chronic Care Manager [facility] [place] [facility] Direct Dial: [phone]

## 2019-07-11 ENCOUNTER — Telehealth: Payer: Self-pay | Admitting: Physician Assistant

## 2019-07-11 ENCOUNTER — Ambulatory Visit: Payer: Medicare Other | Admitting: Physician Assistant

## 2019-07-12 ENCOUNTER — Ambulatory Visit (INDEPENDENT_AMBULATORY_CARE_PROVIDER_SITE_OTHER): Payer: Medicare Other | Admitting: Physician Assistant

## 2019-07-12 ENCOUNTER — Other Ambulatory Visit: Payer: Self-pay

## 2019-07-12 ENCOUNTER — Encounter: Payer: Self-pay | Admitting: Physician Assistant

## 2019-07-12 VITALS — BP 149/80

## 2019-07-12 DIAGNOSIS — E1169 Type 2 diabetes mellitus with other specified complication: Secondary | ICD-10-CM | POA: Diagnosis not present

## 2019-07-12 DIAGNOSIS — E7800 Pure hypercholesterolemia, unspecified: Secondary | ICD-10-CM | POA: Diagnosis not present

## 2019-07-12 DIAGNOSIS — F419 Anxiety disorder, unspecified: Secondary | ICD-10-CM

## 2019-07-12 DIAGNOSIS — E039 Hypothyroidism, unspecified: Secondary | ICD-10-CM

## 2019-07-12 MED ORDER — GLIPIZIDE ER 5 MG PO TB24: 10.0000 mg | ORAL_TABLET | Freq: Every day | ORAL | 3 refills | Status: DC

## 2019-07-12 MED ORDER — ATORVASTATIN CALCIUM 80 MG PO TABS: ORAL_TABLET | ORAL | 5 refills | Status: DC

## 2019-07-12 MED ORDER — LORATADINE 10 MG PO TABS: 10.0000 mg | ORAL_TABLET | Freq: Every day | ORAL | 0 refills | Status: DC

## 2019-07-12 MED ORDER — ALPRAZOLAM 1 MG PO TABS: ORAL_TABLET | ORAL | 2 refills | Status: DC

## 2019-07-12 MED ORDER — SITAGLIPTIN PHOSPHATE 50 MG PO TABS: 50.0000 mg | ORAL_TABLET | Freq: Every day | ORAL | 3 refills | Status: DC

## 2019-07-12 NOTE — Progress Notes (Signed)
1023 149/80      Telephone visit  Subjective: CC: Recheck on chronic medical conditions PCP: Terald Sleeper, PA-C [id number] Maria Alexander is a 86 y.o. female calls for telephone consult today. Patient provides verbal consent for consult held via phone.  Patient is identified with 2 separate identifiers.  At this time the entire area is on COVID-19 social distancing and stay home orders are in place.  Patient is of higher risk and therefore we are performing this by a virtual method.  Location of patient: Home Location of provider: [facility] Others present for call: No  This patient is having a follow-up for her chronic medical conditions which do include well-controlled type 2 diabetes, hypothyroidism, hypertension, anxiety, hyperlipidemia.  I reviewed her labs that were performed last June and they were excellent.  Even her thyroid was also good.  I think we can go a couple more months and have her come in for labs when it is a safer time.  We will also have an office appointment at that time.  She does need refills on some of her standard medications.  The patient does take alprazolam at bedtime and has done so for some years.  I encouraged her to try to take 1/2 tablet when she can and we will address this again when she comes in.  We do need to do a contract and drug screen.  The drug screen is ordered.  Patient also got her Covid 19 vaccine yesterday through the health department.  ANXIETY ASSESSMENT Cause of anxiety: GAD This patient returns for a  month recheck on narcotic use for the above named condition(s)  Current medications-alprazolam 1 mg 1 tablet twice daily as needed for anxiety Other medications tried: Multiple SSRIs, BuSpar Medication side effects-none Any concerns-continue the discussion of trying to lower her medication Any change in general medical condition-no Effectiveness of current meds- good PMP AWARE website reviewed: Yes Any suspicious activity on PMP Aware:  No LME daily dose: 2  Contract on file next visit Last UDS next visit  History of overdose or risk of abuse no  ROS: Per HPI  Allergies  Allergen Reactions  . Shellfish Allergy     hives   Past Medical History:  Diagnosis Date  . Diabetes ([place])   . DVT (deep venous thrombosis) ([place])   . High cholesterol   . Hypothyroid   . Stroke [facility])     Current Outpatient Medications:  .  ALPRAZolam (XANAX) 1 MG tablet, TAKE  (1)  TABLET TWICE A DAY., Disp: 60 tablet, Rfl: 2 .  Aspirin-Acetaminophen-Caffeine (GOODY HEADACHE PO), Take by mouth every morning., Disp: , Rfl:  .  atorvastatin (LIPITOR) 80 MG tablet, Take 1/2 tablet (40 mg total) by mouth daily., Disp: 30 tablet, Rfl: 5 .  glipiZIDE (GLUCOTROL XL) 5 MG 24 hr tablet, Take 2 tablets (10 mg total) by mouth daily with breakfast., Disp: 180 tablet, Rfl: 3 .  levothyroxine (SYNTHROID) 50 MCG tablet, Take 1 tablet (50 mcg total) by mouth daily before breakfast., Disp: 30 tablet, Rfl: 11 .  loratadine (CLARITIN) 10 MG tablet, Take 1 tablet (10 mg total) by mouth daily., Disp: 30 tablet, Rfl: 0 .  losartan (COZAAR) 100 MG tablet, Take 1 tablet (100 mg total) by mouth daily., Disp: 90 tablet, Rfl: 3 .  Multiple Vitamin (MULTIVITAMIN) tablet, Take 1 tablet by mouth daily., Disp: , Rfl:  .  sitaGLIPtin ([name]) 50 MG tablet, Take 1 tablet (50 mg total) by mouth daily., Disp:  90 tablet, Rfl: 3  Assessment/ Plan: 86 y.o. female   1. Anxiety - ALPRAZolam (XANAX) 1 MG tablet; TAKE  (1)  TABLET TWICE A DAY.  Dispense: 60 tablet; Refill: 2 - DRUG SCREEN-TOXASSURE; Future  2. High cholesterol - CBC with Differential/Platelet; Future - CMP14+EGFR; Future - Lipid Panel; Future - Microalbumin / creatinine urine ratio; Future - hgba1c; Future - atorvastatin (LIPITOR) 80 MG tablet; Take 1/2 tablet (40 mg total) by mouth daily.  Dispense: 30 tablet; Refill: 5  3. Type 2 diabetes mellitus with other specified complication, without long-term  current use of insulin (HCC) - CBC with Differential/Platelet; Future - CMP14+EGFR; Future - Lipid Panel; Future - Microalbumin / creatinine urine ratio; Future - hgba1c; Future - glipiZIDE (GLUCOTROL XL) 5 MG 24 hr tablet; Take 2 tablets (10 mg total) by mouth daily with breakfast.  Dispense: 180 tablet; Refill: 3 - sitaGLIPtin ([name]) 50 MG tablet; Take 1 tablet (50 mg total) by mouth daily.  Dispense: 90 tablet; Refill: 3  4. Acquired hypothyroidism - TSH; Future   Return in about 2 months (around 09/09/2019) for recheck and labs.  Continue all other maintenance medications as listed above.  Start time: 10:23 AM End time: 10:35 AM  Meds ordered this encounter  Medications  . ALPRAZolam (XANAX) 1 MG tablet    Sig: TAKE  (1)  TABLET TWICE A DAY.    Dispense:  60 tablet    Refill:  2    Order Specific Question:   Supervising Provider    Answer:   Janora Norlander [[id number]]  . atorvastatin (LIPITOR) 80 MG tablet    Sig: Take 1/2 tablet (40 mg total) by mouth daily.    Dispense:  30 tablet    Refill:  5    Order Specific Question:   Supervising Provider    Answer:   Janora Norlander [[id number]]  . glipiZIDE (GLUCOTROL XL) 5 MG 24 hr tablet    Sig: Take 2 tablets (10 mg total) by mouth daily with breakfast.    Dispense:  180 tablet    Refill:  3    Order Specific Question:   Supervising Provider    Answer:   Janora Norlander [[id number]]  . loratadine (CLARITIN) 10 MG tablet    Sig: Take 1 tablet (10 mg total) by mouth daily.    Dispense:  30 tablet    Refill:  0    Order Specific Question:   Supervising Provider    Answer:   Janora Norlander [[id number]]  . sitaGLIPtin ([name]) 50 MG tablet    Sig: Take 1 tablet (50 mg total) by mouth daily.    Dispense:  90 tablet    Refill:  3    Order Specific Question:   Supervising Provider    Answer:   Janora Norlander [[id number]]    Particia Nearing PA-C [place] [phone]

## 2019-07-13 ENCOUNTER — Telehealth: Payer: Self-pay | Admitting: Physician Assistant

## 2019-07-13 NOTE — Telephone Encounter (Signed)
Pt aware form mailed out on 07/05/19. She will contact [employer] pt assistance

## 2019-07-17 ENCOUNTER — Other Ambulatory Visit: Payer: Self-pay | Admitting: Pharmacy Technician

## 2019-07-17 NOTE — Patient Outreach (Signed)
[facility]) Care Management  07/17/2019  IVAL MCCULLOGH 26-Jun-1924 [id number]   Return call placed to Ms. Sica in regards to [employer] patient Media planner for [employer]. Ms. Mcfarlen did not understand why she was no longer being covered under the program. Explained to her that her coverage had ended on 06/29/19 and that she has to re-apply for 2021 coverage.  Per [facility] encounter on 1/14. They mailed her application to [employer] on 07/05/2019. Informed Ms. Kosman of this information and told her that [employer] is experiencing a delay so it is taking about 3-4 weeks for them to receive and process applications. Suggested to Ms. Ignacio that if she needs medication before she has received an approval, she would need to get samples if provider has any available or have script filled locally which will require her to pay. She confirmed understanding what would need to be done.  [name] Chana Bode Davis Certified Pharmacy Technician [place] Management Direct [phone]

## 2019-07-26 ENCOUNTER — Ambulatory Visit (INDEPENDENT_AMBULATORY_CARE_PROVIDER_SITE_OTHER): Payer: Medicare Other | Admitting: *Deleted

## 2019-07-26 DIAGNOSIS — Z0000 Encounter for general adult medical examination without abnormal findings: Secondary | ICD-10-CM | POA: Diagnosis not present

## 2019-07-26 NOTE — Progress Notes (Signed)
MEDICARE ANNUAL WELLNESS VISIT  07/26/2019  Telephone Visit Disclaimer This Medicare AWV was conducted by telephone due to national recommendations for restrictions regarding the COVID-19 Pandemic (e.g. social distancing).  I verified, using two identifiers, that I am speaking with Maria Alexander or their authorized healthcare agent. I discussed the limitations, risks, security, and privacy concerns of performing an evaluation and management service by telephone and the potential availability of an in-person appointment in the future. The patient expressed understanding and agreed to proceed.   Subjective:  Maria Alexander is a 86 y.o. female patient of Terald Sleeper, PA-C who had a Medicare Annual Wellness Visit today via telephone. Maria Alexander is Retired and lives alone in apartments for seniors and her twin sister lives one door down. she has 2 living children and 1 child that passed away. she reports that she is socially active and does interact with friends/family regularly. she is minimally physically active and enjoys reading the newspaper, playing Delanna Ahmadi and talking with her sister daily.  Patient Care Team: Theodoro Clock as PCP - General (General Practice) Ilean China, RN as Registered Nurse  Advanced Directives 07/26/2019 05/02/2019 07/14/2018 07/10/2018 03/22/2017  Does Patient Have a Medical Advance Directive? No No Yes No No  Type of Advance Directive - - [place] - -  Does patient want to make changes to medical advance directive? - - No - Patient declined - -  Would patient like information on creating a medical advance directive? No - Patient declined - - - Yes (ED - Information included in AVS)    Hospital Utilization Over the Past 12 Months: # of hospitalizations or ER visits: 1 # of surgeries: 0  Review of Systems    Patient reports that her overall health is unchanged compared to last year.  History obtained from chart review  Patient  Reported Readings (BP, Pulse, CBG, Weight, etc) none  Pain Assessment Pain : No/denies pain     Current Medications & Allergies (verified) Allergies as of 07/26/2019      Reactions   Shellfish Allergy    hives      Medication List       Accurate as of July 26, 2019  9:27 AM. If you have any questions, ask your nurse or doctor.        ALPRAZolam 1 MG tablet Commonly known as: XANAX TAKE  (1)  TABLET TWICE A DAY.   atorvastatin 80 MG tablet Commonly known as: LIPITOR Take 1/2 tablet (40 mg total) by mouth daily.   glipiZIDE 5 MG 24 hr tablet Commonly known as: GLUCOTROL XL Take 2 tablets (10 mg total) by mouth daily with breakfast.   GOODY HEADACHE PO Take by mouth every morning.   levothyroxine 50 MCG tablet Commonly known as: SYNTHROID Take 1 tablet (50 mcg total) by mouth daily before breakfast.   loratadine 10 MG tablet Commonly known as: CLARITIN Take 1 tablet (10 mg total) by mouth daily.   losartan 100 MG tablet Commonly known as: COZAAR Take 1 tablet (100 mg total) by mouth daily.   multivitamin tablet Take 1 tablet by mouth daily.   sitaGLIPtin 50 MG tablet Commonly known as: [name] Take 1 tablet (50 mg total) by mouth daily.       History (reviewed): Past Medical History:  Diagnosis Date  . Diabetes ([place])   . DVT (deep venous thrombosis) ([place])   . High cholesterol   . Hypothyroid   .  Stroke [facility])    History reviewed. No pertinent surgical history. Family History  Problem Relation Age of Onset  . Cancer Mother   . Heart disease Father        before age 73  . Heart attack Father   . Diabetes Sister   . Diabetes Brother    Social History   Socioeconomic History  . Marital status: Widowed    Spouse name: Not on file  . Number of children: 3  . Years of education: 10  . Highest education level: 10th grade  Occupational History  . Occupation: retired  Tobacco Use  . Smoking status: Former Smoker    Types: Cigarettes     Quit date: 02/05/1995    Years since quitting: 24.4  . Smokeless tobacco: Never Used  Substance and Sexual Activity  . Alcohol use: No    Alcohol/week: 0.0 standard drinks  . Drug use: No  . Sexual activity: Not Currently    Birth control/protection: None  Other Topics Concern  . Not on file  Social History Narrative  . Not on file   Social Determinants of Health   Financial Resource Strain: Low Risk   . Difficulty of Paying Living Expenses: Not hard at all  Food Insecurity: No Food Insecurity  . Worried About Charity fundraiser in the Last Year: Never true  . Ran Out of Food in the Last Year: Never true  Transportation Needs: No Transportation Needs  . Lack of Transportation (Medical): No  . Lack of Transportation (Non-Medical): No  Physical Activity: Inactive  . Days of Exercise per Week: 0 days  . Minutes of Exercise per Session: 0 min  Stress: No Stress Concern Present  . Feeling of Stress : Not at all  Social Connections: Slightly Isolated  . Frequency of Communication with Friends and Family: More than three times a week  . Frequency of Social Gatherings with Friends and Family: More than three times a week  . Attends Religious Services: More than 4 times per year  . Active Member of Clubs or Organizations: Yes  . Attends Archivist Meetings: More than 4 times per year  . Marital Status: Widowed    Activities of Daily Living In your present state of health, do you have any difficulty performing the following activities: 07/26/2019 07/04/2019  Hearing? N N  Vision? N N  Comment wears glasses-gets yearly eye exam -  Difficulty concentrating or making decisions? N N  Walking or climbing stairs? N N  Dressing or bathing? N N  Doing errands, shopping? N N  Preparing Food and eating ? N N  Using the Toilet? N N  In the past six months, have you accidently leaked urine? N N  Do you have problems with loss of bowel control? N N  Managing your Medications? N N    Managing your Finances? N N  Housekeeping or managing your Housekeeping? N N  Some recent data might be hidden    Patient Education/ Literacy How often do you need to have someone help you when you read instructions, pamphlets, or other written materials from your doctor or pharmacy?: 1 - Never What is the last grade level you completed in school?: 10th grade  Exercise Current Exercise Habits: The patient does not participate in regular exercise at present, Exercise limited by: None identified  Diet Patient reports consuming 2 meals a day and 3 snack(s) a day Patient reports that her primary diet is: Regular Patient reports  that she does have regular access to food.   Depression Screen PHQ 2/9 Scores 07/26/2019 12/09/2018 11/11/2018 11/03/2018 08/23/2018 08/03/2018 07/14/2018  PHQ - 2 Score 0 0 0 0 0 0 0  PHQ- 9 Score - - - - - 0 -     Fall Risk Fall Risk  07/26/2019 12/09/2018 11/11/2018 11/03/2018 08/23/2018  Falls in the past year? 1 1 0 0 1  Number falls in past yr: 1 0 - - 0  Injury with Fall? 0 0 - - 0     Objective:  AYLANIE GARINO seemed alert and oriented and she participated appropriately during our telephone visit.  Blood Pressure Weight BMI  BP Readings from Last 3 Encounters:  07/12/19 (!) 149/80  12/09/18 (!) 175/77  11/11/18 139/74   Wt Readings from Last 3 Encounters:  12/09/18 131 lb (59.4 kg)  11/11/18 129 lb (58.5 kg)  11/03/18 132 lb 6.4 oz (60.1 kg)   BMI Readings from Last 1 Encounters:  12/09/18 25.58 kg/m    *Unable to obtain current vital signs, weight, and BMI due to telephone visit type  Hearing/Vision  . [place] did not seem to have difficulty with hearing/understanding during the telephone conversation . Reports that she has had a formal eye exam by an eye care professional within the past year . Reports that she has not had a formal hearing evaluation within the past year *Unable to fully assess hearing and vision during telephone visit  type  Cognitive Function: 6CIT Screen 07/26/2019  What Year? 0 points  What month? 0 points  What time? 0 points  Count back from 20 0 points  Months in reverse 0 points  Repeat phrase 4 points  Total Score 4   (Normal:0-7, Significant for Dysfunction: >8)  Normal Cognitive Function Screening: Yes   Immunization & Health Maintenance Record Immunization History  Administered Date(s) Administered  . Influenza, High Dose Seasonal PF 04/04/2018  . Influenza,inj,Quad PF,6+ Mos 03/31/2017  . Influenza-Unspecified 04/07/2016, 04/30/2017, 04/04/2018, 02/28/2019  . Pneumococcal Conjugate-13 08/22/2014  . Pneumococcal Polysaccharide-23 04/11/1997  . Zoster Recombinat (Shingrix) 03/22/2017, 06/02/2017    Health Maintenance  Topic Date Due  . OPHTHALMOLOGY EXAM  01/15/1936  . TETANUS/TDAP  01/14/1945  . DEXA SCAN  01/15/1991  . HEMOGLOBIN A1C  06/10/2019  . FOOT EXAM  12/09/2019  . INFLUENZA VACCINE  Completed  . PNA vac Low Risk Adult  Completed       Assessment  This is a routine wellness examination for BEYONCE LAGO.  Health Maintenance: Due or Overdue Health Maintenance Due  Topic Date Due  . OPHTHALMOLOGY EXAM  01/15/1936  . TETANUS/TDAP  01/14/1945  . DEXA SCAN  01/15/1991  . HEMOGLOBIN A1C  06/10/2019    Maria Alexander does not need a referral for Community Assistance: Care Management:   no Social Work:    no Prescription Assistance:  no Nutrition/Diabetes Education:  no   Plan:  Personalized Goals Goals Addressed            This Visit's Progress   . DIET - INCREASE WATER INTAKE       Try to drink 6-8 glasses of water daily      Personalized Health Maintenance & Screening Recommendations  Td vaccine  Lung Cancer Screening Recommended: no (Low Dose CT Chest recommended if Age 66-80 years, 30 pack-year currently smoking OR have quit w/in past 15 years) Hepatitis C Screening recommended: no HIV Screening recommended: no  Advanced Directives:  Written information  was not prepared per patient's request.  Referrals & Orders No orders of the defined types were placed in this encounter.   Follow-up Plan . Follow-up with Terald Sleeper, PA-C as planned . Consider TDAP vaccine at your next visit with your PCP   I have personally reviewed and noted the following in the patient's chart:   . Medical and social history . Use of alcohol, tobacco or illicit drugs  . Current medications and supplements . Functional ability and status . Nutritional status . Physical activity . Advanced directives . List of other physicians . Hospitalizations, surgeries, and ER visits in previous 12 months . Vitals . Screenings to include cognitive, depression, and falls . Referrals and appointments  In addition, I have reviewed and discussed with Maria Alexander certain preventive protocols, quality metrics, and best practice recommendations. A written personalized care plan for preventive services as well as general preventive health recommendations is available and can be mailed to the patient at her request.      Milas Hock, LPN  [id number]

## 2019-07-26 NOTE — Patient Instructions (Signed)
Preventive Care 38 Years and Older, Female Preventive care refers to lifestyle choices and visits with your health care provider that can promote health and wellness. This includes:  A yearly physical exam. This is also called an annual well check.  Regular dental and eye exams.  Immunizations.  Screening for certain conditions.  Healthy lifestyle choices, such as diet and exercise. What can I expect for my preventive care visit? Physical exam Your health care provider will check:  Height and weight. These may be used to calculate body mass index (BMI), which is a measurement that tells if you are at a healthy weight.  Heart rate and blood pressure.  Your skin for abnormal spots. Counseling Your health care provider may ask you questions about:  Alcohol, tobacco, and drug use.  Emotional well-being.  Home and relationship well-being.  Sexual activity.  Eating habits.  History of falls.  Memory and ability to understand (cognition).  Work and work Statistician.  Pregnancy and menstrual history. What immunizations do I need?  Influenza (flu) vaccine  This is recommended every year. Tetanus, diphtheria, and pertussis (Tdap) vaccine  You may need a Td booster every 10 years. Varicella (chickenpox) vaccine  You may need this vaccine if you have not already been vaccinated. Zoster (shingles) vaccine  You may need this after age 33. Pneumococcal conjugate (PCV13) vaccine  One dose is recommended after age 33. Pneumococcal polysaccharide (PPSV23) vaccine  One dose is recommended after age 72. Measles, mumps, and rubella (MMR) vaccine  You may need at least one dose of MMR if you were born in 1957 or later. You may also need a second dose. Meningococcal conjugate (MenACWY) vaccine  You may need this if you have certain conditions. Hepatitis A vaccine  You may need this if you have certain conditions or if you travel or work in places where you may be exposed  to hepatitis A. Hepatitis B vaccine  You may need this if you have certain conditions or if you travel or work in places where you may be exposed to hepatitis B. Haemophilus influenzae type b (Hib) vaccine  You may need this if you have certain conditions. You may receive vaccines as individual doses or as more than one vaccine together in one shot (combination vaccines). Talk with your health care provider about the risks and benefits of combination vaccines. What tests do I need? Blood tests  Lipid and cholesterol levels. These may be checked every 5 years, or more frequently depending on your overall health.  Hepatitis C test.  Hepatitis B test. Screening  Lung cancer screening. You may have this screening every year starting at age 39 if you have a 30-pack-year history of smoking and currently smoke or have quit within the past 15 years.  Colorectal cancer screening. All adults should have this screening starting at age 36 and continuing until age 15. Your health care provider may recommend screening at age 23 if you are at increased risk. You will have tests every 1-10 years, depending on your results and the type of screening test.  Diabetes screening. This is done by checking your blood sugar (glucose) after you have not eaten for a while (fasting). You may have this done every 1-3 years.  Mammogram. This may be done every 1-2 years. Talk with your health care provider about how often you should have regular mammograms.  BRCA-related cancer screening. This may be done if you have a family history of breast, ovarian, tubal, or peritoneal cancers.  Other tests  Sexually transmitted disease (STD) testing.  Bone density scan. This is done to screen for osteoporosis. You may have this done starting at age 44. Follow these instructions at home: Eating and drinking  Eat a diet that includes fresh fruits and vegetables, whole grains, lean protein, and low-fat dairy products. Limit  your intake of foods with high amounts of sugar, saturated fats, and salt.  Take vitamin and mineral supplements as recommended by your health care provider.  Do not drink alcohol if your health care provider tells you not to drink.  If you drink alcohol: ? Limit how much you have to 0-1 drink a day. ? Be aware of how much alcohol is in your drink. In the [place] one drink equals one 12 oz bottle of beer (355 mL), one 5 oz glass of wine (148 mL), or one 1 oz glass of hard liquor (44 mL). Lifestyle  Take daily care of your teeth and gums.  Stay active. Exercise for at least 30 minutes on 5 or more days each week.  Do not use any products that contain nicotine or tobacco, such as cigarettes, e-cigarettes, and chewing tobacco. If you need help quitting, ask your health care provider.  If you are sexually active, practice safe sex. Use a condom or other form of protection in order to prevent STIs (sexually transmitted infections).  Talk with your health care provider about taking a low-dose aspirin or statin. What's next?  Go to your health care provider once a year for a well check visit.  Ask your health care provider how often you should have your eyes and teeth checked.  Stay up to date on all vaccines. This information is not intended to replace advice given to you by your health care provider. Make sure you discuss any questions you have with your health care provider. Document Revised: 06/09/2018 Document Reviewed: 06/09/2018 Elsevier Patient Education  2020 [employer].

## 2019-08-09 ENCOUNTER — Ambulatory Visit: Payer: Medicare Other | Admitting: *Deleted

## 2019-08-09 DIAGNOSIS — E1169 Type 2 diabetes mellitus with other specified complication: Secondary | ICD-10-CM

## 2019-08-09 NOTE — Chronic Care Management (AMB) (Signed)
  Chronic Care Management   Outreach Note  08/09/2019 Name: Maria Alexander MRN: [id number] DOB: 1923-12-02  Referred by: Terald Sleeper, PA-C Reason for referral : Chronic Care Management (CCM follow-up)   An unsuccessful telephone Follow-up was attempted today. The patient was referred to the case management team for assistance with care management and care coordination.   Follow Up Plan: The care management team will reach out to the patient again over the next 15 days.   Chong Sicilian, BSN, RN-BC Embedded Chronic Care Manager [facility] [place] [facility] Direct Dial: [phone]

## 2019-08-16 ENCOUNTER — Ambulatory Visit: Payer: Medicare Other | Admitting: *Deleted

## 2019-08-16 DIAGNOSIS — E1169 Type 2 diabetes mellitus with other specified complication: Secondary | ICD-10-CM

## 2019-08-16 DIAGNOSIS — E1159 Type 2 diabetes mellitus with other circulatory complications: Secondary | ICD-10-CM

## 2019-08-16 NOTE — Chronic Care Management (AMB) (Signed)
  Chronic Care Management   Follow Up Note   08/16/2019 Name: Maria Alexander MRN: [id number] DOB: 06/30/1923  Referred by: Terald Sleeper, PA-C Reason for referral : Chronic Care Management (RN follow up)   Maria Alexander is a 86 y.o. year old female who is a primary care patient of Terald Sleeper, PA-C. The CCM team was consulted for assistance with chronic disease management and care coordination needs.    Review of patient status, including review of consultants reports, relevant laboratory and other test results, and collaboration with appropriate care team members and the patient's provider was performed as part of comprehensive patient evaluation and provision of chronic care management services.    Outpatient Encounter Medications as of 08/16/2019  Medication Sig  . ALPRAZolam (XANAX) 1 MG tablet TAKE  (1)  TABLET TWICE A DAY.  Marland Kitchen Aspirin-Acetaminophen-Caffeine (GOODY HEADACHE PO) Take by mouth every morning.  Marland Kitchen atorvastatin (LIPITOR) 80 MG tablet Take 1/2 tablet (40 mg total) by mouth daily.  Marland Kitchen glipiZIDE (GLUCOTROL XL) 5 MG 24 hr tablet Take 2 tablets (10 mg total) by mouth daily with breakfast.  . levothyroxine (SYNTHROID) 50 MCG tablet Take 1 tablet (50 mcg total) by mouth daily before breakfast.  . loratadine (CLARITIN) 10 MG tablet Take 1 tablet (10 mg total) by mouth daily.  Marland Kitchen losartan (COZAAR) 100 MG tablet Take 1 tablet (100 mg total) by mouth daily.  . Multiple Vitamin (MULTIVITAMIN) tablet Take 1 tablet by mouth daily.  . sitaGLIPtin ([name]) 50 MG tablet Take 1 tablet (50 mg total) by mouth daily.   No facility-administered encounter medications on file as of 08/16/2019.     RN Care Plan           This Visit's Progress   . Chronic Disease Management Needs       Current Barriers:  . Chronic Disease Management support, education, and care coordination needs related to HTN, DM, hypothyroidism, hyperlipidemia, anxiety  Clinical Goal(s) related to HTN, DM,  hypothyroidism, hyperlipidemia, anxiety:  Over the next 90 days, patient will:  . Work with the care management team to address educational, disease management, and care coordination needs  . Call provider office for new or worsened signs and symptoms  . Call care management team with questions or concerns . Verbalize basic understanding of patient centered plan of care established today  Interventions related to HTN, DM, hypothyroidism, hyperlipidemia, anxiety:  . Chart reviewed . Evaluation of current treatment plans and patient's adherence to plan as established by provider o Patient denies any needs related to her chronic medical conditions at this time . Provided with RN CCM contact info and encouraged to reach out as needed . RN will follow-up with patient by telephone again over the next 45 days  Patient Self Care Activities related to HTN, DM, hypothyroidism, hyperlipidemia, anxiety:  . Patient is unable to independently self-manage chronic health conditions  Please see past updates related to this goal by clicking on the "Past Updates" button in the selected goal          Plan:   The care management team will reach out to the patient again over the next 45 days.   Chong Sicilian, BSN, RN-BC Embedded Chronic Care Manager [facility] [place] Management Direct Dial: [phone]

## 2019-08-16 NOTE — Patient Instructions (Signed)
Visit Information  Goals Addressed            This Visit's Progress   . Chronic Disease Management Needs       Current Barriers:  . Chronic Disease Management support, education, and care coordination needs related to HTN, DM, hypothyroidism, hyperlipidemia, anxiety  Clinical Goal(s) related to HTN, DM, hypothyroidism, hyperlipidemia, anxiety:  Over the next 90 days, patient will:  . Work with the care management team to address educational, disease management, and care coordination needs  . Call provider office for new or worsened signs and symptoms  . Call care management team with questions or concerns . Verbalize basic understanding of patient centered plan of care established today  Interventions related to HTN, DM, hypothyroidism, hyperlipidemia, anxiety:  . Chart reviewed . Evaluation of current treatment plans and patient's adherence to plan as established by provider o Patient denies any needs related to her chronic medical conditions at this time . Provided with RN CCM contact info and encouraged to reach out as needed . RN will follow-up with patient by telephone again over the next 45 days  Patient Self Care Activities related to HTN, DM, hypothyroidism, hyperlipidemia, anxiety:  . Patient is unable to independently self-manage chronic health conditions  Please see past updates related to this goal by clicking on the "Past Updates" button in the selected goal        The care management team will reach out to the patient again over the next 45 days.    Chong Sicilian, BSN, RN-BC Embedded Chronic Care Manager [facility] Direct Dial: [phone]   The patient verbalized understanding of instructions provided today and declined a print copy of patient instruction materials.

## 2019-08-29 ENCOUNTER — Other Ambulatory Visit: Payer: Self-pay | Admitting: Physician Assistant

## 2019-09-13 ENCOUNTER — Ambulatory Visit (INDEPENDENT_AMBULATORY_CARE_PROVIDER_SITE_OTHER): Payer: Medicare Other | Admitting: Physician Assistant

## 2019-09-13 ENCOUNTER — Other Ambulatory Visit: Payer: Self-pay

## 2019-09-13 ENCOUNTER — Encounter: Payer: Self-pay | Admitting: Physician Assistant

## 2019-09-13 VITALS — BP 129/71 | HR 71 | Temp 96.4°F | Ht 60.0 in | Wt 129.6 lb

## 2019-09-13 DIAGNOSIS — E1169 Type 2 diabetes mellitus with other specified complication: Secondary | ICD-10-CM

## 2019-09-13 DIAGNOSIS — E039 Hypothyroidism, unspecified: Secondary | ICD-10-CM

## 2019-09-13 DIAGNOSIS — E7800 Pure hypercholesterolemia, unspecified: Secondary | ICD-10-CM

## 2019-09-13 DIAGNOSIS — Z0289 Encounter for other administrative examinations: Secondary | ICD-10-CM | POA: Diagnosis not present

## 2019-09-13 DIAGNOSIS — F419 Anxiety disorder, unspecified: Secondary | ICD-10-CM | POA: Diagnosis not present

## 2019-09-13 DIAGNOSIS — Z79891 Long term (current) use of opiate analgesic: Secondary | ICD-10-CM | POA: Diagnosis not present

## 2019-09-13 LAB — BAYER DCA HB A1C WAIVED: HB A1C (BAYER DCA - WAIVED): 7.1 % — ABNORMAL HIGH (ref <7.0)

## 2019-09-13 MED ORDER — ALPRAZOLAM 1 MG PO TABS: ORAL_TABLET | ORAL | 2 refills | Status: DC

## 2019-09-14 LAB — CBC WITH DIFFERENTIAL/PLATELET
Basophils Absolute: 0 10*3/uL (ref 0.0–0.2)
Basos: 1 %
EOS (ABSOLUTE): 0.1 10*3/uL (ref 0.0–0.4)
Eos: 2 %
Hematocrit: 38.9 % (ref 34.0–46.6)
Hemoglobin: 13.1 g/dL (ref 11.1–15.9)
Immature Grans (Abs): 0 10*3/uL (ref 0.0–0.1)
Immature Granulocytes: 0 %
Lymphocytes Absolute: 1.4 10*3/uL (ref 0.7–3.1)
Lymphs: 24 %
MCH: 31.2 pg (ref 26.6–33.0)
MCHC: 33.7 g/dL (ref 31.5–35.7)
MCV: 93 fL (ref 79–97)
Monocytes Absolute: 0.5 10*3/uL (ref 0.1–0.9)
Monocytes: 9 %
Neutrophils Absolute: 3.7 10*3/uL (ref 1.4–7.0)
Neutrophils: 64 %
Platelets: 142 10*3/uL — ABNORMAL LOW (ref 150–450)
RBC: 4.2 x10E6/uL (ref 3.77–5.28)
RDW: 13.8 % (ref 11.7–15.4)
WBC: 5.7 10*3/uL (ref 3.4–10.8)

## 2019-09-14 LAB — LIPID PANEL
Chol/HDL Ratio: 2.9 ratio (ref 0.0–4.4)
Cholesterol, Total: 161 mg/dL (ref 100–199)
HDL: 55 mg/dL (ref >39)
LDL Chol Calc (NIH): 81 mg/dL (ref 0–99)
Triglycerides: 143 mg/dL (ref 0–149)
VLDL Cholesterol Cal: 25 mg/dL (ref 5–40)

## 2019-09-14 LAB — CMP14+EGFR
ALT: 19 IU/L (ref 0–32)
AST: 22 IU/L (ref 0–40)
Albumin/Globulin Ratio: 2.3 — ABNORMAL HIGH (ref 1.2–2.2)
Albumin: 4.2 g/dL (ref 3.5–4.6)
Alkaline Phosphatase: 70 IU/L (ref 39–117)
BUN/Creatinine Ratio: 15 (ref 12–28)
BUN: 12 mg/dL (ref 10–36)
Bilirubin Total: 0.6 mg/dL (ref 0.0–1.2)
CO2: 25 mmol/L (ref 20–29)
Calcium: 9.7 mg/dL (ref 8.7–10.3)
Chloride: 101 mmol/L (ref 96–106)
Creatinine, Ser: 0.82 mg/dL (ref 0.57–1.00)
GFR calc Af Amer: 71 mL/min/{1.73_m2} (ref >59)
GFR calc non Af Amer: 62 mL/min/{1.73_m2} (ref >59)
Globulin, Total: 1.8 g/dL (ref 1.5–4.5)
Glucose: 136 mg/dL — ABNORMAL HIGH (ref 65–99)
Potassium: 4.6 mmol/L (ref 3.5–5.2)
Sodium: 140 mmol/L (ref 134–144)
Total Protein: 6 g/dL (ref 6.0–8.5)

## 2019-09-14 LAB — TSH: TSH: 2.53 u[IU]/mL (ref 0.450–4.500)

## 2019-09-14 LAB — MICROALBUMIN / CREATININE URINE RATIO
Creatinine, Urine: 74.3 mg/dL
Microalb/Creat Ratio: 9 mg/g creat (ref 0–29)
Microalbumin, Urine: 6.5 ug/mL

## 2019-09-16 LAB — TOXASSURE SELECT 13 (MW), URINE

## 2019-09-21 ENCOUNTER — Telehealth: Payer: Medicare Other

## 2019-09-21 NOTE — Progress Notes (Signed)
BP 129/71   Pulse 71   Temp (!) 96.4 F (35.8 C)   Ht 5' (1.524 m)   Wt 129 lb 9.6 oz (58.8 kg)   SpO2 97%   BMI 25.31 kg/m    Subjective:    Patient ID: Maria Alexander, female    DOB: January 15, 1924, 86 y.o.   MRN: [id number]  Diabetes She presents for her follow-up diabetic visit. She has type 2 diabetes mellitus. Her disease course has been stable. Hypoglycemia symptoms include nervousness/anxiousness. Symptoms are stable. Current diabetic treatment includes oral agent (monotherapy). She is following a generally healthy diet. She participates in exercise three times a week. An ACE inhibitor/angiotensin II receptor blocker is being taken. She does not see a podiatrist.Eye exam is current.  Hyperlipidemia This is a chronic problem. The problem is controlled. Current antihyperlipidemic treatment includes statins. The current treatment provides significant improvement of lipids.   1. Medication management contract agreement  2. Anxiety  3. High cholesterol  4. Type 2 diabetes mellitus with other specified complication, without long-term current use of insulin ([place])  5. Acquired hypothyroidism  ANXIETY ASSESSMENT Cause of anxiety: GAD with insomnia This patient returns for a  month recheck on narcotic use for the above named condition(s)  Current medications-alprazolam 1 mg 1 tablet up to twice daily as needed for anxiety, generally takes 1 at bedtime Other medications tried: SSRIs Medication side effects-none Any concerns-no Any change in general medical condition-none Effectiveness of current meds- good PMP AWARE website reviewed: Yes Any suspicious activity on PMP Aware: No LME daily dose: 2  Contract on file 09/13/19 Last UDS  3/17/21good results  History of overdose or risk of abuse no   Past Medical History:  Diagnosis Date  . Diabetes ([place])   . DVT (deep venous thrombosis) ([place])   . High cholesterol   . Hypothyroid   . Stroke [facility])    Relevant past medical,  surgical, family and social history reviewed and updated as indicated. Interim medical history since our last visit reviewed. Allergies and medications reviewed and updated. DATA REVIEWED: CHART IN EPIC  Family History reviewed for pertinent findings.  Review of Systems  Constitutional: Negative.   HENT: Negative.   Eyes: Negative.   Respiratory: Negative.   Gastrointestinal: Negative.   Genitourinary: Negative.   Psychiatric/Behavioral: Negative for agitation, decreased concentration and dysphoric mood. The patient is nervous/anxious.     Allergies as of 09/13/2019      Reactions   Shellfish Allergy    hives      Medication List       Accurate as of September 13, 2019 11:59 PM. If you have any questions, ask your nurse or doctor.        ALPRAZolam 1 MG tablet Commonly known as: XANAX TAKE  (1)  TABLET TWICE A DAY.   atorvastatin 80 MG tablet Commonly known as: LIPITOR Take 1/2 tablet (40 mg total) by mouth daily.   glipiZIDE 5 MG 24 hr tablet Commonly known as: GLUCOTROL XL Take 2 tablets (10 mg total) by mouth daily with breakfast.   GOODY HEADACHE PO Take by mouth every morning.   levothyroxine 50 MCG tablet Commonly known as: SYNTHROID Take 1 tablet (50 mcg total) by mouth daily before breakfast.   loratadine 10 MG tablet Commonly known as: CLARITIN Take 1 tablet (10 mg total) by mouth daily.   losartan 100 MG tablet Commonly known as: COZAAR Take 1 tablet (100 mg total) by mouth daily.   multivitamin  tablet Take 1 tablet by mouth daily.   sitaGLIPtin 50 MG tablet Commonly known as: [name] Take 1 tablet (50 mg total) by mouth daily.          Objective:    BP 129/71   Pulse 71   Temp (!) 96.4 F (35.8 C)   Ht 5' (1.524 m)   Wt 129 lb 9.6 oz (58.8 kg)   SpO2 97%   BMI 25.31 kg/m   Allergies  Allergen Reactions  . Shellfish Allergy     hives    Wt Readings from Last 3 Encounters:  09/13/19 129 lb 9.6 oz (58.8 kg)  12/09/18 131 lb  (59.4 kg)  11/11/18 129 lb (58.5 kg)    Physical Exam Constitutional:      General: She is not in acute distress.    Appearance: Normal appearance. She is well-developed.  HENT:     Head: Normocephalic and atraumatic.  Cardiovascular:     Rate and Rhythm: Normal rate.  Pulmonary:     Effort: Pulmonary effort is normal.  Skin:    General: Skin is warm and dry.     Findings: No rash.  Neurological:     Mental Status: She is alert and oriented to person, place, and time.     Deep Tendon Reflexes: Reflexes are normal and symmetric.     Results for orders placed or performed in visit on 09/13/19  ToxASSURE Select 13 (MW), Urine  Result Value Ref Range   Summary Note   hgba1c  Result Value Ref Range   HB A1C (BAYER DCA - WAIVED) 7.1 (H) <7.0 %  Microalbumin / creatinine urine ratio  Result Value Ref Range   Creatinine, Urine 74.3 Not Estab. mg/dL   Microalbumin, Urine 6.5 Not Estab. ug/mL   Microalb/Creat Ratio 9 0 - 29 mg/g creat  TSH  Result Value Ref Range   TSH 2.530 0.450 - 4.500 uIU/mL  Lipid Panel  Result Value Ref Range   Cholesterol, Total 161 100 - 199 mg/dL   Triglycerides 143 0 - 149 mg/dL   HDL 55 >39 mg/dL   VLDL Cholesterol Cal 25 5 - 40 mg/dL   LDL Chol Calc (NIH) 81 0 - 99 mg/dL   Chol/HDL Ratio 2.9 0.0 - 4.4 ratio  CMP14+EGFR  Result Value Ref Range   Glucose 136 (H) 65 - 99 mg/dL   BUN 12 10 - 36 mg/dL   Creatinine, Ser 0.82 0.57 - 1.00 mg/dL   GFR calc non Af Amer 62 >59 mL/min/1.73   GFR calc Af Amer 71 >59 mL/min/1.73   BUN/Creatinine Ratio 15 12 - 28   Sodium 140 134 - 144 mmol/L   Potassium 4.6 3.5 - 5.2 mmol/L   Chloride 101 96 - 106 mmol/L   CO2 25 20 - 29 mmol/L   Calcium 9.7 8.7 - 10.3 mg/dL   Total Protein 6.0 6.0 - 8.5 g/dL   Albumin 4.2 3.5 - 4.6 g/dL   Globulin, Total 1.8 1.5 - 4.5 g/dL   Albumin/Globulin Ratio 2.3 (H) 1.2 - 2.2   Bilirubin Total 0.6 0.0 - 1.2 mg/dL   Alkaline Phosphatase 70 39 - 117 IU/L   AST 22 0 - 40 IU/L     ALT 19 0 - 32 IU/L  CBC with Differential/Platelet  Result Value Ref Range   WBC 5.7 3.4 - 10.8 x10E3/uL   RBC 4.20 3.77 - 5.28 x10E6/uL   Hemoglobin 13.1 11.1 - 15.9 g/dL   Hematocrit 38.9 34.0 -  46.6 %   MCV 93 79 - 97 fL   MCH 31.2 26.6 - 33.0 pg   MCHC 33.7 31.5 - 35.7 g/dL   RDW 13.8 11.7 - 15.4 %   Platelets 142 (L) 150 - 450 x10E3/uL   Neutrophils 64 Not Estab. %   Lymphs 24 Not Estab. %   Monocytes 9 Not Estab. %   Eos 2 Not Estab. %   Basos 1 Not Estab. %   Neutrophils Absolute 3.7 1.4 - 7.0 x10E3/uL   Lymphocytes Absolute 1.4 0.7 - 3.1 x10E3/uL   Monocytes Absolute 0.5 0.1 - 0.9 x10E3/uL   EOS (ABSOLUTE) 0.1 0.0 - 0.4 x10E3/uL   Basophils Absolute 0.0 0.0 - 0.2 x10E3/uL   Immature Granulocytes 0 Not Estab. %   Immature Grans (Abs) 0.0 0.0 - 0.1 x10E3/uL      Assessment & Plan:   1. Medication management contract agreement - ToxASSURE Select 13 (MW), Urine  2. Anxiety - ALPRAZolam (XANAX) 1 MG tablet; TAKE  (1)  TABLET TWICE A DAY.  Dispense: 60 tablet; Refill: 2  3. High cholesterol - hgba1c - Microalbumin / creatinine urine ratio - Lipid Panel - CMP14+EGFR - CBC with Differential/Platelet  4. Type 2 diabetes mellitus with other specified complication, without long-term current use of insulin (HCC) - hgba1c - Microalbumin / creatinine urine ratio - Lipid Panel - CMP14+EGFR - CBC with Differential/Platelet  5. Acquired hypothyroidism - TSH   Continue all other maintenance medications as listed above.  Follow up plan: No follow-ups on file.  Educational handout given for [place] PA-C [place] [address]  [place], [place] [address] [phone]   09/21/2019, 2:11 PM

## 2019-09-28 ENCOUNTER — Telehealth: Payer: Medicare Other

## 2019-09-28 ENCOUNTER — Ambulatory Visit: Payer: Medicare Other | Admitting: *Deleted

## 2019-10-16 NOTE — Chronic Care Management (AMB) (Signed)
  Chronic Care Management   Outreach Note  10/16/2019 Name: Maria Alexander MRN: [id number] DOB: May 28, 1924  Referred by: Janora Norlander, DO Reason for referral : Chronic Care Management (RN follow up)   An unsuccessful telephone follow-up was attempted today. The patient was referred to the case management team for assistance with care management and care coordination.   Follow Up Plan: The care management team will reach out to the patient again over the next 30 days.   Chong Sicilian, BSN, RN-BC Embedded Chronic Care Manager [facility] [place] [facility] Direct Dial: [phone]

## 2019-11-22 ENCOUNTER — Ambulatory Visit: Payer: Medicare Other | Admitting: *Deleted

## 2019-11-22 DIAGNOSIS — E1169 Type 2 diabetes mellitus with other specified complication: Secondary | ICD-10-CM

## 2019-11-22 DIAGNOSIS — E039 Hypothyroidism, unspecified: Secondary | ICD-10-CM

## 2019-11-22 NOTE — Chronic Care Management (AMB) (Signed)
  Chronic Care Management   Follow-up Note  11/22/2019 Name: Maria Alexander MRN: [id number] DOB: 04/18/1924  Referred by: Janora Norlander, DO Reason for referral : Chronic Care Management (RN follow up)   An unsuccessful telephone follow-up was attempted today. The patient was referred to the case management team for assistance with care management and care coordination.   Follow Up Plan: A HIPPA compliant phone message was left for the patient providing contact information and requesting a return call.  The care management team will reach out to the patient again over the next 30 days.   Chong Sicilian, BSN, RN-BC Embedded Chronic Care Manager [facility] Direct Dial: [phone]

## 2019-12-01 ENCOUNTER — Ambulatory Visit: Payer: Medicare Other | Admitting: *Deleted

## 2019-12-15 ENCOUNTER — Encounter: Payer: Self-pay | Admitting: Family Medicine

## 2019-12-15 ENCOUNTER — Ambulatory Visit (INDEPENDENT_AMBULATORY_CARE_PROVIDER_SITE_OTHER): Payer: Medicare Other | Admitting: Family Medicine

## 2019-12-15 ENCOUNTER — Other Ambulatory Visit: Payer: Self-pay

## 2019-12-15 VITALS — BP 162/82 | HR 67 | Temp 97.1°F | Ht 60.0 in | Wt 128.0 lb

## 2019-12-15 DIAGNOSIS — Z7689 Persons encountering health services in other specified circumstances: Secondary | ICD-10-CM

## 2019-12-15 DIAGNOSIS — F419 Anxiety disorder, unspecified: Secondary | ICD-10-CM | POA: Diagnosis not present

## 2019-12-15 DIAGNOSIS — I10 Essential (primary) hypertension: Secondary | ICD-10-CM

## 2019-12-15 DIAGNOSIS — E785 Hyperlipidemia, unspecified: Secondary | ICD-10-CM

## 2019-12-15 DIAGNOSIS — E039 Hypothyroidism, unspecified: Secondary | ICD-10-CM

## 2019-12-15 DIAGNOSIS — Z79899 Other long term (current) drug therapy: Secondary | ICD-10-CM

## 2019-12-15 DIAGNOSIS — E1159 Type 2 diabetes mellitus with other circulatory complications: Secondary | ICD-10-CM | POA: Diagnosis not present

## 2019-12-15 DIAGNOSIS — E1169 Type 2 diabetes mellitus with other specified complication: Secondary | ICD-10-CM | POA: Diagnosis not present

## 2019-12-15 LAB — BAYER DCA HB A1C WAIVED: HB A1C (BAYER DCA - WAIVED): 7.2 % — ABNORMAL HIGH

## 2019-12-15 MED ORDER — ALPRAZOLAM 1 MG PO TABS: 0.5000 mg | ORAL_TABLET | Freq: Every evening | ORAL | 5 refills | Status: DC | PRN

## 2019-12-15 NOTE — Progress Notes (Signed)
Subjective: CC: est care, DM PCP: Janora Norlander, DO Maria Alexander Consalvo is a 86 y.o. female presenting to clinic today for:  1. Type 2 Diabetes Alexander/ HTN, HLD:  Patient reports compliance with Glucotrol and [name] (she gets the [name] through a patient assistance program).  No hypoglycemic episodes.  She reports compliance with Lipitor and Cozaar.  No chest pain, shortness of breath.  She does have some difficulty with ambulation but this is due to back problems.  She is currently using a brace and asking for a handicap placard.  Last eye exam: Had done with Dr. Carolynn Sayers in [place] Last foot exam: needs Last A1c:  Lab Results  Component Value Date   HGBA1C 7.1 (H) 09/13/2019   Nephropathy screen indicated?:  On ARB Last flu, zoster and/or pneumovax:  Immunization History  Administered Date(s) Administered  . Influenza, High Dose Seasonal PF 04/04/2018  . Influenza,inj,Quad PF,6+ Mos 03/31/2017  . Influenza-Unspecified 04/07/2016, 04/30/2017, 04/04/2018, 02/28/2019  . Pneumococcal Conjugate-13 08/22/2014  . Pneumococcal Polysaccharide-23 04/11/1997  . Zoster Recombinat (Shingrix) 03/22/2017, 06/02/2017   2. GAD Reports nightly use of alprazolam.  Denies any excessive daytime sedation, falls, memory changes, hallucinations.  No respiratory depression.  She resides alone.  She has a twin sister who is also seen at our office.  3. Hypothyroidism Compliant with levothyroxine.  Does not report any change in voice, difficulty swallowing, tremor, heart palpitation, change in weight or bowel habits.  ROS: Per HPI  Allergies  Allergen Reactions  . Shellfish Allergy     hives   Past Medical History:  Diagnosis Date  . Diabetes ([place])   . DVT (deep venous thrombosis) ([place])   . High cholesterol   . Hypothyroid   . Stroke [facility])     Current Outpatient Medications:  .  ALPRAZolam (XANAX) 1 MG tablet, TAKE  (1)  TABLET TWICE A DAY., Disp: 60 tablet, Rfl: 2 .   Aspirin-Acetaminophen-Caffeine (GOODY HEADACHE PO), Take by mouth every morning., Disp: , Rfl:  .  atorvastatin (LIPITOR) 80 MG tablet, Take 1/2 tablet (40 mg total) by mouth daily., Disp: 30 tablet, Rfl: 5 .  glipiZIDE (GLUCOTROL XL) 5 MG 24 hr tablet, Take 2 tablets (10 mg total) by mouth daily with breakfast., Disp: 180 tablet, Rfl: 3 .  levothyroxine (SYNTHROID) 50 MCG tablet, Take 1 tablet (50 mcg total) by mouth daily before breakfast., Disp: 30 tablet, Rfl: 11 .  loratadine (CLARITIN) 10 MG tablet, Take 1 tablet (10 mg total) by mouth daily., Disp: 30 tablet, Rfl: 5 .  losartan (COZAAR) 100 MG tablet, Take 1 tablet (100 mg total) by mouth daily., Disp: 90 tablet, Rfl: 3 .  Multiple Vitamin (MULTIVITAMIN) tablet, Take 1 tablet by mouth daily., Disp: , Rfl:  .  sitaGLIPtin ([name]) 50 MG tablet, Take 1 tablet (50 mg total) by mouth daily., Disp: 90 tablet, Rfl: 3 Social History   Socioeconomic History  . Marital status: Widowed    Spouse name: Not on file  . Number of children: 3  . Years of education: 10  . Highest education level: 10th grade  Occupational History  . Occupation: retired  Tobacco Use  . Smoking status: Former Smoker    Types: Cigarettes    Quit date: 02/05/1995    Years since quitting: 24.8  . Smokeless tobacco: Never Used  Vaping Use  . Vaping Use: Never used  Substance and Sexual Activity  . Alcohol use: No    Alcohol/week: 0.0 standard drinks  .  Drug use: No  . Sexual activity: Not Currently    Birth control/protection: None  Other Topics Concern  . Not on file  Social History Narrative  . Not on file   Social Determinants of Health   Financial Resource Strain: Low Risk   . Difficulty of Paying Living Expenses: Not hard at all  Food Insecurity: No Food Insecurity  . Worried About Charity fundraiser in the Last Year: Never true  . Ran Out of Food in the Last Year: Never true  Transportation Needs: No Transportation Needs  . Lack of Transportation  (Medical): No  . Lack of Transportation (Non-Medical): No  Physical Activity: Inactive  . Days of Exercise per Week: 0 days  . Minutes of Exercise per Session: 0 min  Stress: No Stress Concern Present  . Feeling of Stress : Not at all  Social Connections: Moderately Integrated  . Frequency of Communication with Friends and Family: More than three times a week  . Frequency of Social Gatherings with Friends and Family: More than three times a week  . Attends Religious Services: More than 4 times per year  . Active Member of Clubs or Organizations: Yes  . Attends Archivist Meetings: More than 4 times per year  . Marital Status: Widowed  Intimate Partner Violence: Not At Risk  . Fear of Current or Ex-Partner: No  . Emotionally Abused: No  . Physically Abused: No  . Sexually Abused: No   Family History  Problem Relation Age of Onset  . Cancer Mother   . Heart disease Father        before age 42  . Heart attack Father   . Diabetes Sister   . Diabetes Brother     Objective: Office vital signs reviewed. BP (!) 162/82   Pulse 67   Temp (!) 97.1 F (36.2 C) (Temporal)   Ht 5' (1.524 m)   Wt 128 lb (58.1 kg)   SpO2 97%   BMI 25.00 kg/m   Physical Examination:  General: Awake, alert, well nourished, No acute distress HEENT: Normal, sclera white, MMM; no goiter or exophthalmos Cardio: regular rate and rhythm, S1S2 heard, 1/6 SEM appreciated Pulm: clear to auscultation bilaterally, no wheezes, rhonchi or rales; normal work of breathing on room air Extremities: warm, well perfused, No edema, cyanosis or clubbing; +2 pulses bilaterally MSK: slow gait and normal station Skin: dry; intact; no rashes or lesions Neuro: No tremor. Psych: Mood stable, speech normal, affect appropriate  Depression screen [facility] 2/9 12/15/2019 09/13/2019 07/26/2019  Decreased Interest 0 0 0  Down, Depressed, Hopeless 0 0 0  PHQ - 2 Score 0 0 0  Altered sleeping 0 - -  Tired, decreased energy 0  - -  Change in appetite 0 - -  Feeling bad or failure about yourself  0 - -  Trouble concentrating 0 - -  Moving slowly or fidgety/restless 0 - -  Suicidal thoughts 0 - -  PHQ-9 Score 0 - -   No flowsheet data found.   Assessment/ Plan: 86 y.o. female   1. Type 2 diabetes mellitus with other specified complication, without long-term current use of insulin (HCC) Check A1c.  Release of info completed for Dr. Velvet Bathe office.  We will plan for foot exam at next visit - Bayer DCA Hb A1c Waived  2. Hypertension associated with diabetes ([place]) Not controlled.  May need to consider adding calcium channel blocker.  She apparently has whitecoat syndrome so we will  have her monitor BPs at home and record before next visit  3. Hyperlipidemia associated with type 2 diabetes mellitus ([place]) Continue statin  4. Acquired hypothyroidism Asymptomatic - Thyroid Panel With TSH  5. Anxiety Stable.  We discussed the risks of benzodiazepine use.  She is using this less than currently prescribed so I adjusted the medication to reflect current use.  The national narcotic database was reviewed and there were no red flags.  Refill sent to pharmacy.  Controlled substance contract and UDS obtained as per office policy - ALPRAZolam (XANAX) 1 MG tablet; Take 0.5-1 tablets (0.5-1 mg total) by mouth at bedtime as needed for anxiety.  Dispense: 30 tablet; Refill: 5  6. Chronically on benzodiazepine therapy  7. Establishing care with new doctor, encounter for   No orders of the defined types were placed in this encounter.  No orders of the defined types were placed in this encounter.    Janora Norlander, DO [place] [phone]

## 2019-12-15 NOTE — Patient Instructions (Addendum)
You had labs performed today.  You will be contacted with the results of the labs once they are available, usually in the next 3 business days for routine lab work.  If you have an active my chart account, they will be released to your MyChart.  If you prefer to have these labs released to you via telephone, please let us know.  If you had a pap smear or biopsy performed, expect to be contacted in about 7-10 days.  Your sugar stable.  A1c is 7.2 today.  We discussed the risks of Xanax use.  Uses very sparingly and with caution.  See me in 6 months, sooner if needed  Diabetes Mellitus and Standards of Medical Care Managing diabetes (diabetes mellitus) can be complicated. Your diabetes treatment may be managed by a team of health care providers, including:  A physician who specializes in diabetes (endocrinologist).  A nurse practitioner or physician assistant.  Nurses.  A diet and nutrition specialist (registered dietitian).  A certified diabetes educator (CDE).  An exercise specialist.  A pharmacist.  An eye doctor.  A foot specialist (podiatrist).  A dentist.  A primary care provider.  A mental health provider. Your health care providers follow guidelines to help you get the best quality of care. The following schedule is a general guideline for your diabetes management plan. Your health care providers may give you more specific instructions. Physical exams Upon being diagnosed with diabetes mellitus, and each year after that, your health care provider will ask about your medical and family history. He or she will also do a physical exam. Your exam may include:  Measuring your height, weight, and body mass index (BMI).  Checking your blood pressure. This will be done at every routine medical visit. Your target blood pressure may vary depending on your medical conditions, your age, and other factors.  Thyroid gland exam.  Skin exam.  Screening for damage to your nerves  (peripheral neuropathy). This may include checking the pulse in your legs and feet and checking the level of sensation in your hands and feet.  A complete foot exam to inspect the structure and skin of your feet, including checking for cuts, bruises, redness, blisters, sores, or other problems.  Screening for blood vessel (vascular) problems, which may include checking the pulse in your legs and feet and checking your temperature. Blood tests Depending on your treatment plan and your personal needs, you may have the following tests done:  HbA1c (hemoglobin A1c). This test provides information about blood sugar (glucose) control over the previous 2-3 months. It is used to adjust your treatment plan, if needed. This test will be done: ? At least 2 times a year, if you are meeting your treatment goals. ? 4 times a year, if you are not meeting your treatment goals or if treatment goals have changed.  Lipid testing, including total, LDL, and HDL cholesterol and triglyceride levels. ? The goal for LDL is less than 100 mg/dL (5.5 mmol/L). If you are at high risk for complications, the goal is less than 70 mg/dL (3.9 mmol/L). ? The goal for HDL is 40 mg/dL (2.2 mmol/L) or higher for men and 50 mg/dL (2.8 mmol/L) or higher for women. An HDL cholesterol of 60 mg/dL (3.3 mmol/L) or higher gives some protection against heart disease. ? The goal for triglycerides is less than 150 mg/dL (8.3 mmol/L).  Liver function tests.  Kidney function tests.  Thyroid function tests. Dental and eye exams  Visit your  dentist two times a year.  If you have type 1 diabetes, your health care provider may recommend an eye exam 3-5 years after you are diagnosed, and then once a year after your first exam. ? For children with type 1 diabetes, a health care provider may recommend an eye exam when your child is age 19 or older and has had diabetes for 3-5 years. After the first exam, your child should get an eye exam once a  year.  If you have type 2 diabetes, your health care provider may recommend an eye exam as soon as you are diagnosed, and then once a year after your first exam. Immunizations   The yearly flu (influenza) vaccine is recommended for everyone 6 months or older who has diabetes.  The pneumonia (pneumococcal) vaccine is recommended for everyone 2 years or older who has diabetes. If you are 73 or older, you may get the pneumonia vaccine as a series of two separate shots.  The hepatitis B vaccine is recommended for adults shortly after being diagnosed with diabetes.  Adults and children with diabetes should receive all other vaccines according to age-specific recommendations from the Centers for Disease Control and Prevention (CDC). Mental and emotional health Screening for symptoms of eating disorders, anxiety, and depression is recommended at the time of diagnosis and afterward as needed. If your screening shows that you have symptoms (positive screening result), you may need more evaluation and you may work with a mental health care provider. Treatment plan Your treatment plan will be reviewed at every medical visit. You and your health care provider will discuss:  How you are taking your medicines, including insulin.  Any side effects you are experiencing.  Your blood glucose target goals.  The frequency of your blood glucose monitoring.  Lifestyle habits, such as activity level as well as tobacco, alcohol, and substance use. Diabetes self-management education Your health care provider will assess how well you are monitoring your blood glucose levels and whether you are taking your insulin correctly. He or she may refer you to:  A certified diabetes educator to manage your diabetes throughout your life, starting at diagnosis.  A registered dietitian who can create or review your personal nutrition plan.  An exercise specialist who can discuss your activity level and exercise  plan. Summary  Managing diabetes (diabetes mellitus) can be complicated. Your diabetes treatment may be managed by a team of health care providers.  Your health care providers follow guidelines in order to help you get the best quality of care.  Standards of care including having regular physical exams, blood tests, blood pressure monitoring, immunizations, screening tests, and education about how to manage your diabetes.  Your health care providers may also give you more specific instructions based on your individual health. This information is not intended to replace advice given to you by your health care provider. Make sure you discuss any questions you have with your health care provider. Document Revised: 03/04/2018 Document Reviewed: 03/13/2016 Elsevier Patient Education  [place].

## 2019-12-16 LAB — THYROID PANEL WITH TSH
Free Thyroxine Index: 2.2 (ref 1.2–4.9)
T3 Uptake Ratio: 27 % (ref 24–39)
T4, Total: 8.1 ug/dL (ref 4.5–12.0)
TSH: 2.51 u[IU]/mL (ref 0.450–4.500)

## 2019-12-18 ENCOUNTER — Other Ambulatory Visit: Payer: Self-pay | Admitting: Family Medicine

## 2019-12-18 DIAGNOSIS — E039 Hypothyroidism, unspecified: Secondary | ICD-10-CM

## 2019-12-18 MED ORDER — LEVOTHYROXINE SODIUM 50 MCG PO TABS: 50.0000 ug | ORAL_TABLET | Freq: Every day | ORAL | 11 refills | Status: DC

## 2019-12-25 NOTE — Chronic Care Management (AMB) (Signed)
  Chronic Care Management   Follow-up Outreach  12/01/2019 Name: Maria Alexander MRN: [id number] DOB: [birth year]  Referred by: Janora Norlander, DO Reason for referral : Chronic Care Management (RN Follow up)   An unsuccessful telephone Follow-up was attempted today. The patient was referred to the case management team for assistance with care management and care coordination.   Follow Up Plan: The care management team will reach out to the patient again over the next 30 days.   Chong Sicilian, BSN, RN-BC Embedded Chronic Care Manager [facility] [place] Management Direct Dial: [phone]

## 2019-12-27 ENCOUNTER — Ambulatory Visit: Payer: Self-pay | Admitting: *Deleted

## 2019-12-27 NOTE — Chronic Care Management (AMB) (Signed)
°  Chronic Care Management   Note  12/27/2019 Name: Maria Alexander MRN: [id number] DOB: 26-Dec-1923  [name] was referred to the [facility] program for care management and care coordination. She was engaged in 07/2019 but there have been 3 unsuccessful outreach attempts since then without a return call. I am removing patient from the CCM program but will remain available for assistance in the future if she wishes to enroll again.   Follow up plan: CCM status changed to "previoulsy enrolled" after 3 consecutive unsuccessful outreach attempts with no return telephone call.   Chong Sicilian, BSN, RN-BC Embedded Chronic Care Manager [facility] / [place] Management Direct Dial: [phone]

## 2020-01-04 ENCOUNTER — Other Ambulatory Visit: Payer: Self-pay | Admitting: *Deleted

## 2020-01-04 DIAGNOSIS — E7800 Pure hypercholesterolemia, unspecified: Secondary | ICD-10-CM

## 2020-01-04 MED ORDER — ATORVASTATIN CALCIUM 80 MG PO TABS: ORAL_TABLET | ORAL | 5 refills | Status: DC

## 2020-01-11 IMAGING — CR DG KNEE COMPLETE 4+V*L*
4 series · 4 of 4 positions shown · non-contrast
Comparison: None.

CLINICAL DATA: [AGE] with POSTERIOR LEFT knee pain that began
today while walking. No specific injury.

EXAM:
LEFT KNEE - COMPLETE 4+ VIEW

[ap]
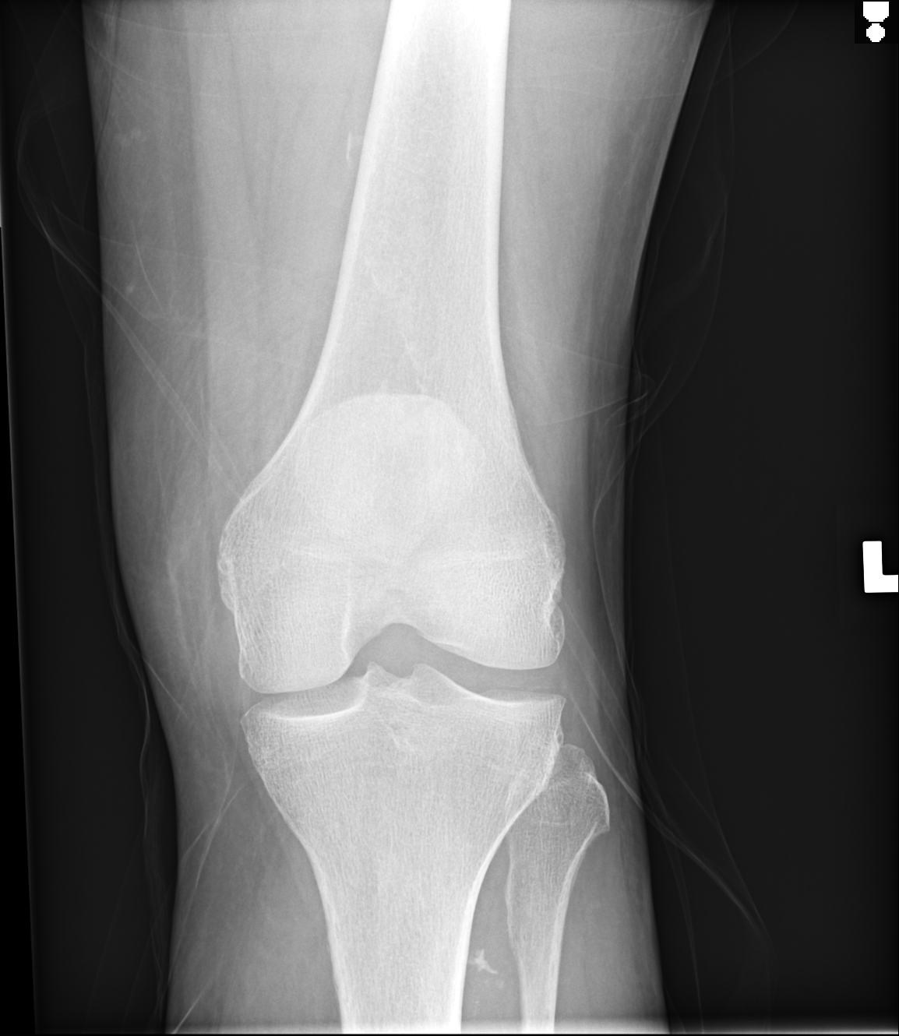

[oblique (1 of 3)]
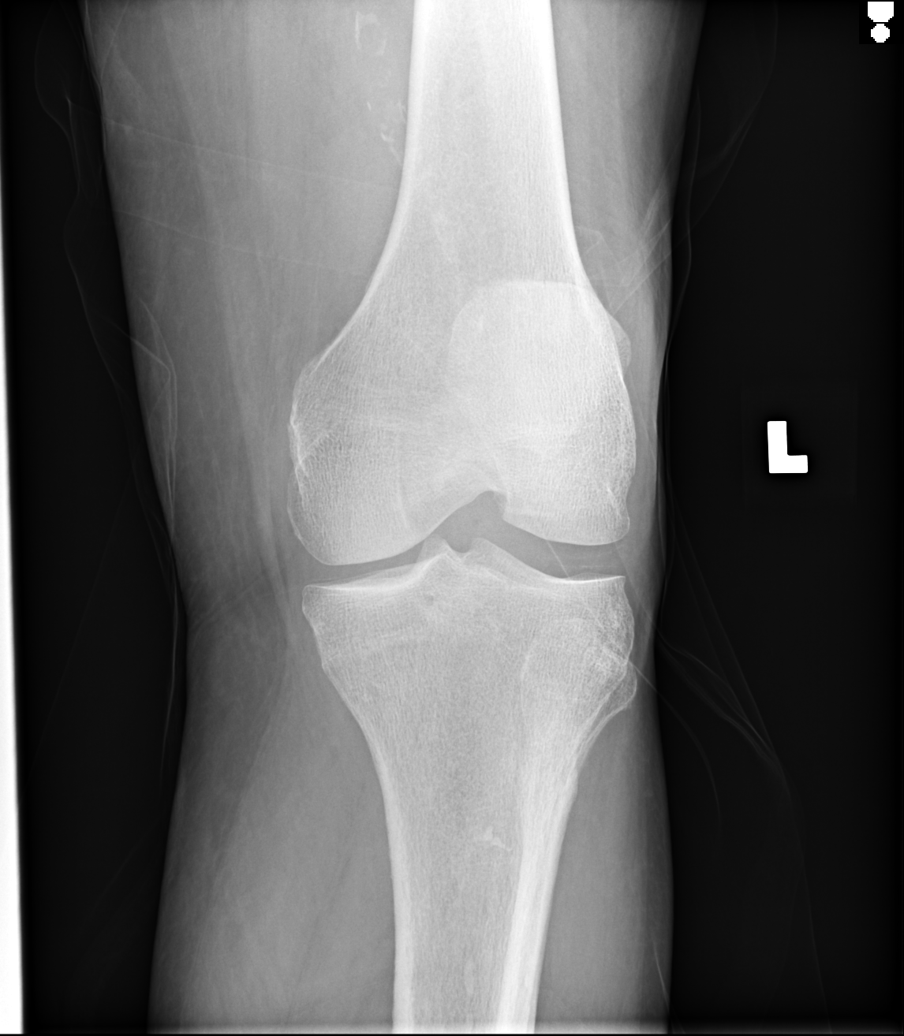

[oblique (2 of 3)]
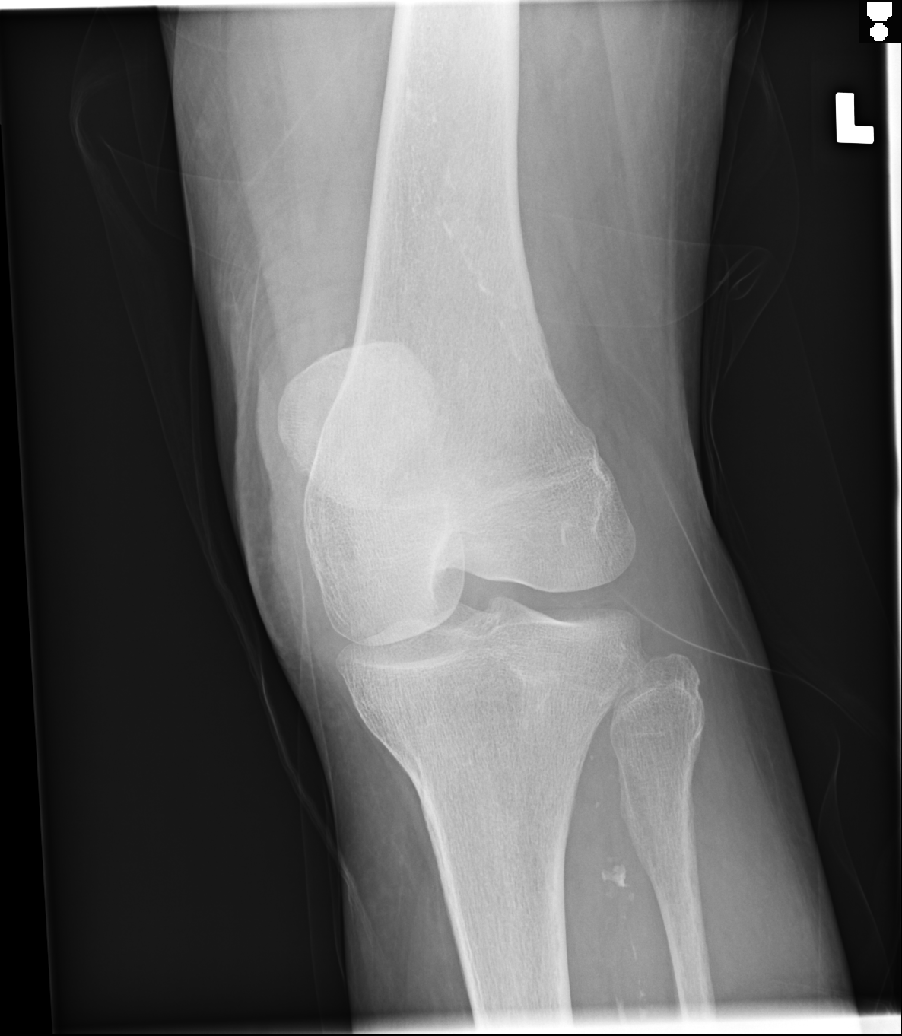

[oblique (3 of 3)]
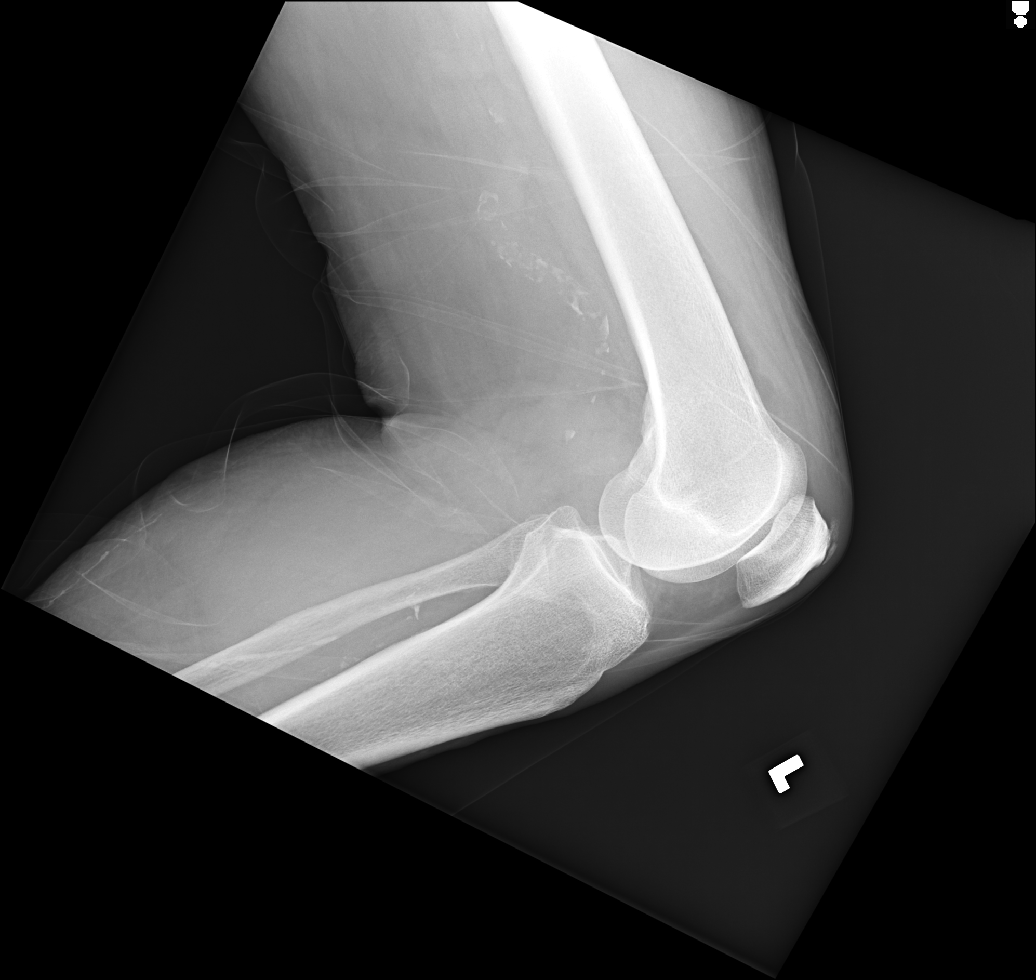

[4 of 4 positions shown; findings below may reference images not displayed]

FINDINGS: No evidence of acute, subacute or healed fractures. Mild
patellofemoral compartment joint space narrowing. MEDIAL and LATERAL
compartment joint spaces well-preserved. Bone mineral density well
preserved for age. No visible joint effusion. Minimal enthesopathic
spurring at the insertion of the quadriceps tendon on the superior
patella. Femoropopliteal and tibioperoneal artery atherosclerosis.
IMPRESSION: Mild patellofemoral compartment joint space narrowing. No acute or
subacute osseous abnormality.

## 2020-03-11 ENCOUNTER — Other Ambulatory Visit: Payer: Self-pay | Admitting: *Deleted

## 2020-03-11 MED ORDER — LORATADINE 10 MG PO TABS: 10.0000 mg | ORAL_TABLET | Freq: Every day | ORAL | 5 refills | Status: DC

## 2020-03-11 MED ORDER — LOSARTAN POTASSIUM 100 MG PO TABS: 100.0000 mg | ORAL_TABLET | Freq: Every day | ORAL | 0 refills | Status: DC

## 2020-05-01 DIAGNOSIS — H353131 Nonexudative age-related macular degeneration, bilateral, early dry stage: Secondary | ICD-10-CM | POA: Diagnosis not present

## 2020-05-01 DIAGNOSIS — Z961 Presence of intraocular lens: Secondary | ICD-10-CM | POA: Diagnosis not present

## 2020-05-01 DIAGNOSIS — E119 Type 2 diabetes mellitus without complications: Secondary | ICD-10-CM | POA: Diagnosis not present

## 2020-05-01 DIAGNOSIS — H04123 Dry eye syndrome of bilateral lacrimal glands: Secondary | ICD-10-CM | POA: Diagnosis not present

## 2020-05-01 LAB — HM DIABETES EYE EXAM

## 2020-05-17 ENCOUNTER — Other Ambulatory Visit: Payer: Self-pay | Admitting: *Deleted

## 2020-05-17 DIAGNOSIS — E7800 Pure hypercholesterolemia, unspecified: Secondary | ICD-10-CM

## 2020-05-17 MED ORDER — ATORVASTATIN CALCIUM 80 MG PO TABS: ORAL_TABLET | ORAL | 0 refills | Status: DC

## 2020-06-07 ENCOUNTER — Other Ambulatory Visit: Payer: Self-pay | Admitting: Family Medicine

## 2020-06-07 DIAGNOSIS — F419 Anxiety disorder, unspecified: Secondary | ICD-10-CM

## 2020-06-25 ENCOUNTER — Other Ambulatory Visit: Payer: Self-pay | Admitting: Family Medicine

## 2020-07-22 ENCOUNTER — Encounter: Payer: Self-pay | Admitting: Family Medicine

## 2020-07-22 ENCOUNTER — Other Ambulatory Visit: Payer: Self-pay

## 2020-07-22 ENCOUNTER — Ambulatory Visit (INDEPENDENT_AMBULATORY_CARE_PROVIDER_SITE_OTHER): Payer: Medicare Other | Admitting: Family Medicine

## 2020-07-22 VITALS — BP 118/63 | HR 91 | Temp 97.8°F | Ht 60.0 in | Wt 128.0 lb

## 2020-07-22 DIAGNOSIS — F419 Anxiety disorder, unspecified: Secondary | ICD-10-CM | POA: Diagnosis not present

## 2020-07-22 DIAGNOSIS — E7800 Pure hypercholesterolemia, unspecified: Secondary | ICD-10-CM | POA: Diagnosis not present

## 2020-07-22 DIAGNOSIS — Z79899 Other long term (current) drug therapy: Secondary | ICD-10-CM

## 2020-07-22 DIAGNOSIS — E1169 Type 2 diabetes mellitus with other specified complication: Secondary | ICD-10-CM | POA: Diagnosis not present

## 2020-07-22 DIAGNOSIS — I152 Hypertension secondary to endocrine disorders: Secondary | ICD-10-CM | POA: Diagnosis not present

## 2020-07-22 DIAGNOSIS — E1159 Type 2 diabetes mellitus with other circulatory complications: Secondary | ICD-10-CM | POA: Diagnosis not present

## 2020-07-22 DIAGNOSIS — E785 Hyperlipidemia, unspecified: Secondary | ICD-10-CM

## 2020-07-22 DIAGNOSIS — E039 Hypothyroidism, unspecified: Secondary | ICD-10-CM | POA: Diagnosis not present

## 2020-07-22 LAB — BAYER DCA HB A1C WAIVED: HB A1C (BAYER DCA - WAIVED): 8.2 % — ABNORMAL HIGH (ref <7.0)

## 2020-07-22 MED ORDER — LOSARTAN POTASSIUM 100 MG PO TABS: 100.0000 mg | ORAL_TABLET | Freq: Every day | ORAL | 3 refills | Status: DC

## 2020-07-22 MED ORDER — LEVOTHYROXINE SODIUM 50 MCG PO TABS
50.0000 ug | ORAL_TABLET | Freq: Every day | ORAL | 3 refills | Status: DC
Start: 2020-07-22 — End: 2021-07-21

## 2020-07-22 MED ORDER — GLIPIZIDE ER 5 MG PO TB24: 10.0000 mg | ORAL_TABLET | Freq: Every day | ORAL | 3 refills | Status: DC

## 2020-07-22 MED ORDER — ATORVASTATIN CALCIUM 80 MG PO TABS: ORAL_TABLET | ORAL | 3 refills | Status: DC

## 2020-07-22 MED ORDER — SITAGLIPTIN PHOSPHATE 50 MG PO TABS: 50.0000 mg | ORAL_TABLET | Freq: Every day | ORAL | 3 refills | Status: DC

## 2020-07-22 MED ORDER — ALPRAZOLAM 1 MG PO TABS: 0.5000 mg | ORAL_TABLET | Freq: Every evening | ORAL | 5 refills | Status: DC | PRN

## 2020-07-22 NOTE — Patient Instructions (Signed)
I will try and arrange a new patient assistance application for [name].  You may get a call from a [facility] nurse/ pharmacist that will help with this.  You had labs performed today.  You will be contacted with the results of the labs once they are available, usually in the next 3 business days for routine lab work.  If you have an active my chart account, they will be released to your MyChart.  If you prefer to have these labs released to you via telephone, please let us know.  If you had a pap smear or biopsy performed, expect to be contacted in about 7-10 days.

## 2020-07-22 NOTE — Progress Notes (Signed)
Subjective: CC: DM PCP: Janora Norlander, DO PYP:[id number] W Maria Alexander is a 87 y.o. female presenting to clinic today for:  1. Type 2 Diabetes with hypertension, hyperlipidemia:  She reports compliance with her Glucotrol and [name].  Unfortunately, her patient assistance form has run out and she is asking for assistance to renew this so that she can get her [name] at a discounted rate.  She reports compliance with losartan, Lipitor.  No reports of chest pain, shortness of breath, dizziness, sensory changes or falls  Last eye exam: Up-to-date Last foot exam: Up-to-date Last A1c:  Lab Results  Component Value Date   HGBA1C 7.2 (H) 12/15/2019   Nephropathy screen indicated?:  On ARB Last flu, zoster and/or pneumovax:  Immunization History  Administered Date(s) Administered  . Fluad Quad(high Dose 65+) 04/01/2020  . Influenza, High Dose Seasonal PF 04/04/2018  . Influenza,inj,Quad PF,6+ Mos 03/31/2017  . Influenza-Unspecified 04/07/2016, 04/30/2017, 04/04/2018, 02/28/2019  . Moderna Sars-Covid-2 Vaccination 07/11/2019, 08/11/2019, 04/24/2020  . Pneumococcal Conjugate-13 08/22/2014  . Pneumococcal Polysaccharide-23 04/11/1997  . Zoster Recombinat (Shingrix) 03/22/2017, 06/02/2017    2.  Insomnia/situational anxiety Patient continues to use 1/2 tablet of Xanax nightly most nights.  Occasionally she will take 1/2 tablet during the daytime if she is going out to crowded area.  She denies any falls, visual or auditory hallucinations, respiratory depression or memory loss.   ROS: Per HPI  Allergies  Allergen Reactions  . Shellfish Allergy     hives   Past Medical History:  Diagnosis Date  . Diabetes ([place])   . DVT (deep venous thrombosis) ([place])   . High cholesterol   . Hypothyroid   . Stroke [facility])     Current Outpatient Medications:  .  ALPRAZolam (XANAX) 1 MG tablet, Take 0.5-1 tablets (0.5-1 mg total) by mouth at bedtime as needed for anxiety., Disp: 30 tablet, Rfl: 5 .   Aspirin-Acetaminophen-Caffeine (GOODY HEADACHE PO), Take by mouth every morning., Disp: , Rfl:  .  atorvastatin (LIPITOR) 80 MG tablet, Take 1/2 tablet (40 mg total) by mouth daily., Disp: 90 tablet, Rfl: 0 .  glipiZIDE (GLUCOTROL XL) 5 MG 24 hr tablet, Take 2 tablets (10 mg total) by mouth daily with breakfast., Disp: 180 tablet, Rfl: 3 .  levothyroxine (SYNTHROID) 50 MCG tablet, Take 1 tablet (50 mcg total) by mouth daily before breakfast., Disp: 30 tablet, Rfl: 11 .  loratadine (CLARITIN) 10 MG tablet, Take 1 tablet (10 mg total) by mouth daily., Disp: 30 tablet, Rfl: 5 .  losartan (COZAAR) 100 MG tablet, TAKE 1 TABLET DAILY, Disp: 90 tablet, Rfl: 0 .  Multiple Vitamin (MULTIVITAMIN) tablet, Take 1 tablet by mouth daily., Disp: , Rfl:  .  sitaGLIPtin ([name]) 50 MG tablet, Take 1 tablet (50 mg total) by mouth daily., Disp: 90 tablet, Rfl: 3 Social History   Socioeconomic History  . Marital status: Widowed    Spouse name: Not on file  . Number of children: 3  . Years of education: 10  . Highest education level: 10th grade  Occupational History  . Occupation: retired  Tobacco Use  . Smoking status: Former Smoker    Types: Cigarettes    Quit date: 02/05/1995    Years since quitting: 25.4  . Smokeless tobacco: Never Used  Vaping Use  . Vaping Use: Never used  Substance and Sexual Activity  . Alcohol use: No    Alcohol/week: 0.0 standard drinks  . Drug use: No  . Sexual activity: Not Currently  Birth control/protection: None  Other Topics Concern  . Not on file  Social History Narrative  . Not on file   Social Determinants of Health   Financial Resource Strain: Low Risk   . Difficulty of Paying Living Expenses: Not hard at all  Food Insecurity: No Food Insecurity  . Worried About Charity fundraiser in the Last Year: Never true  . Ran Out of Food in the Last Year: Never true  Transportation Needs: No Transportation Needs  . Lack of Transportation (Medical): No  . Lack of  Transportation (Non-Medical): No  Physical Activity: Inactive  . Days of Exercise per Week: 0 days  . Minutes of Exercise per Session: 0 min  Stress: No Stress Concern Present  . Feeling of Stress : Not at all  Social Connections: Moderately Integrated  . Frequency of Communication with Friends and Family: More than three times a week  . Frequency of Social Gatherings with Friends and Family: More than three times a week  . Attends Religious Services: More than 4 times per year  . Active Member of Clubs or Organizations: Yes  . Attends Archivist Meetings: More than 4 times per year  . Marital Status: Widowed  Intimate Partner Violence: Not At Risk  . Fear of Current or Ex-Partner: No  . Emotionally Abused: No  . Physically Abused: No  . Sexually Abused: No   Family History  Problem Relation Age of Onset  . Cancer Mother   . Heart disease Father        before age 58  . Heart attack Father   . Diabetes Sister   . Diabetes Brother     Objective: Office vital signs reviewed. BP 118/63   Pulse 91   Temp 97.8 F (36.6 C) (Temporal)   Ht 5' (1.524 m)   Wt 128 lb (58.1 kg)   SpO2 96%   BMI 25.00 kg/m   Physical Examination:  General: Awake, alert, well nourished, No acute distress HEENT: Normal; sclera white.  Moist mucous membranes Cardio: regular rate and rhythm, S1S2 heard, no murmurs appreciated Pulm: clear to auscultation bilaterally, no wheezes, rhonchi or rales; normal work of breathing on room air Extremities: warm, well perfused, No edema, cyanosis or clubbing; +2 pulses bilaterally MSK: normal gait and station Neuro: No tremor Psych: Mood stable, speech normal, affect appropriate.  Patient is pleasant and interactive Lab Results  Component Value Date   HGBA1C 8.2 (H) 07/22/2020     Assessment/ Plan: 87 y.o. female   Type 2 diabetes mellitus with other specified complication, without long-term current use of insulin (HCC) - Plan: Bayer DCA Hb A1c  Waived, CMP14+EGFR, glipiZIDE (GLUCOTROL XL) 5 MG 24 hr tablet, sitaGLIPtin ([name]) 50 MG tablet  Hypertension associated with diabetes ([place]) - Plan: CMP14+EGFR  Hyperlipidemia associated with type 2 diabetes mellitus ([place]) - Plan: CMP14+EGFR  Anxiety - Plan: ToxASSURE Select 13 (MW), Urine, ALPRAZolam (XANAX) 1 MG tablet  Chronically on benzodiazepine therapy - Plan: ToxASSURE Select 13 (MW), Urine  Acquired hypothyroidism - Plan: Thyroid Panel With TSH, levothyroxine (SYNTHROID) 50 MCG tablet  High cholesterol - Plan: atorvastatin (LIPITOR) 80 MG tablet  Sugars not at goal with A1c rising to 8.2.  This may be secondary to inability to take medication since she has been out of this.  I have completed the patient assistance program enrollment form for [employer] and we will get this sent ASAP.  Patient will need to come into the office to sign.  Continue statin, blood pressure medication  National narcotic database was reviewed and there were no red flags.   Okay to continue as needed Xanax.  UDS was updated as per office policy today.  CSC is in place.  No orders of the defined types were placed in this encounter.  No orders of the defined types were placed in this encounter.    Janora Norlander, DO [place] [phone]

## 2020-07-23 LAB — CMP14+EGFR
ALT: 19 IU/L (ref 0–32)
AST: 26 IU/L (ref 0–40)
Albumin/Globulin Ratio: 2 (ref 1.2–2.2)
Albumin: 4.3 g/dL (ref 3.5–4.6)
Alkaline Phosphatase: 76 IU/L (ref 44–121)
BUN/Creatinine Ratio: 20 (ref 12–28)
BUN: 16 mg/dL (ref 10–36)
Bilirubin Total: 0.3 mg/dL (ref 0.0–1.2)
CO2: 22 mmol/L (ref 20–29)
Calcium: 10.3 mg/dL (ref 8.7–10.3)
Chloride: 97 mmol/L (ref 96–106)
Creatinine, Ser: 0.81 mg/dL (ref 0.57–1.00)
GFR calc Af Amer: 72 mL/min/{1.73_m2} (ref >59)
GFR calc non Af Amer: 62 mL/min/{1.73_m2} (ref >59)
Globulin, Total: 2.1 g/dL (ref 1.5–4.5)
Glucose: 268 mg/dL — ABNORMAL HIGH (ref 65–99)
Potassium: 4.1 mmol/L (ref 3.5–5.2)
Sodium: 138 mmol/L (ref 134–144)
Total Protein: 6.4 g/dL (ref 6.0–8.5)

## 2020-07-23 LAB — THYROID PANEL WITH TSH
Free Thyroxine Index: 2.6 (ref 1.2–4.9)
T3 Uptake Ratio: 30 % (ref 24–39)
T4, Total: 8.6 ug/dL (ref 4.5–12.0)
TSH: 2.76 u[IU]/mL (ref 0.450–4.500)

## 2020-07-26 ENCOUNTER — Other Ambulatory Visit: Payer: Self-pay | Admitting: *Deleted

## 2020-07-26 LAB — TOXASSURE SELECT 13 (MW), URINE

## 2020-07-26 MED ORDER — ESTROGENS, CONJUGATED 0.625 MG/GM VA CREA: 1.0000 | TOPICAL_CREAM | VAGINAL | 0 refills | Status: DC

## 2020-07-30 ENCOUNTER — Other Ambulatory Visit: Payer: Self-pay | Admitting: Family Medicine

## 2020-08-01 ENCOUNTER — Telehealth: Payer: Self-pay

## 2020-08-01 NOTE — Telephone Encounter (Signed)
Patient aware and is understanding

## 2020-08-02 ENCOUNTER — Ambulatory Visit (INDEPENDENT_AMBULATORY_CARE_PROVIDER_SITE_OTHER): Payer: Medicare Other

## 2020-08-02 ENCOUNTER — Telehealth: Payer: Self-pay

## 2020-08-02 DIAGNOSIS — Z0000 Encounter for general adult medical examination without abnormal findings: Secondary | ICD-10-CM

## 2020-08-02 NOTE — Progress Notes (Signed)
[place] VISIT  08/02/2020  Telephone Visit Disclaimer This Medicare AWV was conducted by telephone due to national recommendations for restrictions regarding the COVID-19 Pandemic (e.g. social distancing).  I verified, using two identifiers, that I am speaking with Maria Alexander or their authorized healthcare agent. I discussed the limitations, risks, security, and privacy concerns of performing an evaluation and management service by telephone and the potential availability of an in-person appointment in the future. The patient expressed understanding and agreed to proceed.  Location of Patient: Home Location of Provider (nurse):  [facility]  Subjective:    Maria Alexander is a 87 y.o. female patient of Maria Norlander, DO who had a Medicare Annual Wellness Visit today via telephone. Johnine is Retired and lives alone. She had three children, one who has passed away. She reports that she is socially active and does interact with friends/family regularly. She is minimally physically active and enjoys spending time with friends and family.  Patient Care Team: Maria Norlander, DO as PCP - General (Family Medicine)  Advanced Directives 08/02/2020 07/26/2019 05/02/2019 07/14/2018 07/10/2018 03/22/2017  Does Patient Have a Medical Advance Directive? No No No Yes No No  Type of Advance Directive - - - [place] - -  Does patient want to make changes to medical advance directive? - - - No - Patient declined - -  Would patient like information on creating a medical advance directive? No - Patient declined No - Patient declined - - - Yes (ED - Information included in AVS)    Hospital Utilization Over the Past 12 Months: # of hospitalizations or ER visits: 0 # of surgeries: 0  Review of Systems    Patient reports that her overall health is unchanged compared to last year.  History obtained from chart review and the patient  Patient Reported Readings (BP,  Pulse, CBG, Weight, etc) none  Pain Assessment Pain : 0-10 Pain Score: 5  Pain Location: Back Pain Orientation: Mid Pain Descriptors / Indicators: Constant,Aching,Discomfort Pain Onset: Other (comment) Pain Frequency: Several days a week     Current Medications & Allergies (verified) Allergies as of 08/02/2020      Reactions   Shellfish Allergy    hives      Medication List       Accurate as of August 02, 2020 11:06 AM. If you have any questions, ask your nurse or doctor.        ALPRAZolam 1 MG tablet Commonly known as: XANAX Take 0.5-1 tablets (0.5-1 mg total) by mouth at bedtime as needed for anxiety.   atorvastatin 80 MG tablet Commonly known as: LIPITOR Take 1/2 tablet (40 mg total) by mouth daily.   conjugated estrogens vaginal cream Commonly known as: PREMARIN Place 1 Applicatorful vaginally once a week.   glipiZIDE 5 MG 24 hr tablet Commonly known as: GLUCOTROL XL Take 2 tablets (10 mg total) by mouth daily with breakfast.   GOODY HEADACHE PO Take by mouth every morning.   levothyroxine 50 MCG tablet Commonly known as: SYNTHROID Take 1 tablet (50 mcg total) by mouth daily before breakfast.   loratadine 10 MG tablet Commonly known as: CLARITIN TAKE 1 TABLET DAILY   losartan 100 MG tablet Commonly known as: COZAAR Take 1 tablet (100 mg total) by mouth daily.   multivitamin tablet Take 1 tablet by mouth daily.   sitaGLIPtin 50 MG tablet Commonly known as: [name] Take 1 tablet (50 mg total) by mouth  daily.       History (reviewed): Past Medical History:  Diagnosis Date  . Diabetes (HCC)   . DVT (deep venous thrombosis) (HCC)   . High cholesterol   . Hypothyroid   . Stroke [facility](HCC)    History reviewed. No pertinent surgical history. Family History  Problem Relation Age of Onset  . Cancer Mother   . Heart disease Father        before age 85  . Heart attack Father   . Diabetes Sister   . Diabetes Brother    Social History    Socioeconomic History  . Marital status: Widowed    Spouse name: Not on file  . Number of children: 3  . Years of education: 10  . Highest education level: 10th grade  Occupational History  . Occupation: retired  Tobacco Use  . Smoking status: Former Smoker    Types: Cigarettes    Quit date: 02/05/1995    Years since quitting: 25.5  . Smokeless tobacco: Never Used  Vaping Use  . Vaping Use: Never used  Substance and Sexual Activity  . Alcohol use: No    Alcohol/week: 0.0 standard drinks  . Drug use: No  . Sexual activity: Not Currently    Birth control/protection: None  Other Topics Concern  . Not on file  Social History Narrative  . Not on file   Social Determinants of Health   Financial Resource Strain: Not on file  Food Insecurity: Not on file  Transportation Needs: Not on file  Physical Activity: Not on file  Stress: Not on file  Social Connections: Not on file    Activities of Daily Living In your present state of health, do you have any difficulty performing the following activities: 08/02/2020  Hearing? N  Vision? N  Difficulty concentrating or making decisions? N  Walking or climbing stairs? N  Dressing or bathing? N  Doing errands, shopping? N  Preparing Food and eating ? N  Using the Toilet? N  In the past six months, have you accidently leaked urine? N  Do you have problems with loss of bowel control? N  Managing your Medications? N  Managing your Finances? N  Housekeeping or managing your Housekeeping? N  Some recent data might be hidden    Patient Education/ Literacy How often do you need to have someone help you when you read instructions, pamphlets, or other written materials from your doctor or pharmacy?: 1 - Never What is the last grade level you completed in school?: 12th grade  Exercise Current Exercise Habits: The patient does not participate in regular exercise at present, Exercise limited by: None identified  Diet Patient reports  consuming 3 meals a day and 1 snack(s) a day Patient reports that her primary diet is: Regular Patient reports that she does have regular access to food.   Depression Screen PHQ 2/9 Scores 07/22/2020 12/15/2019 09/13/2019 07/26/2019 12/09/2018 11/11/2018 11/03/2018  PHQ - 2 Score 0 0 0 0 0 0 0  PHQ- 9 Score 0 0 - - - - -     Fall Risk Fall Risk  08/02/2020 07/22/2020 12/15/2019 09/13/2019 07/26/2019  Falls in the past year? 0 0 1 1 1   Number falls in past yr: - - 0 1 1  Injury with Fall? - - 0 0 0  Risk for fall due to : - - History of fall(s) History of fall(s);Impaired balance/gait -  Follow up Falls evaluation completed - Falls prevention discussed Falls prevention discussed -  Objective:  Maria Alexander seemed alert and oriented and she participated appropriately during our telephone visit.  Blood Pressure Weight BMI  BP Readings from Last 3 Encounters:  07/22/20 118/63  12/15/19 (!) 162/82  09/13/19 129/71   Wt Readings from Last 3 Encounters:  07/22/20 128 lb (58.1 kg)  12/15/19 128 lb (58.1 kg)  09/13/19 129 lb 9.6 oz (58.8 kg)   BMI Readings from Last 1 Encounters:  07/22/20 25.00 kg/m    *Unable to obtain current vital signs, weight, and BMI due to telephone visit type  Hearing/Vision  . Jordain did not seem to have difficulty with hearing/understanding during the telephone conversation . Reports that she has had a formal eye exam by an eye care professional within the past year . Reports that she has not had a formal hearing evaluation within the past year *Unable to fully assess hearing and vision during telephone visit type  Cognitive Function: 6CIT Screen 08/02/2020 07/26/2019  What Year? 0 points 0 points  What month? 0 points 0 points  What time? 0 points 0 points  Count back from 20 0 points 0 points  Months in reverse 0 points 0 points  Repeat phrase 2 points 4 points  Total Score 2 4   (Normal:0-7, Significant for Dysfunction: >8)  Normal Cognitive Function  Screening: Yes   Immunization & Health Maintenance Record Immunization History  Administered Date(s) Administered  . Fluad Quad(high Dose 65+) 04/01/2020  . Influenza, High Dose Seasonal PF 04/04/2018  . Influenza,inj,Quad PF,6+ Mos 03/31/2017  . Influenza-Unspecified 04/07/2016, 04/30/2017, 04/04/2018, 02/28/2019  . Moderna Sars-Covid-2 Vaccination 07/11/2019, 08/11/2019, 04/24/2020  . Pneumococcal Conjugate-13 08/22/2014  . Pneumococcal Polysaccharide-23 04/11/1997  . Zoster Recombinat (Shingrix) 03/22/2017, 06/02/2017    Health Maintenance  Topic Date Due  . TETANUS/TDAP  Never done  . DEXA SCAN  Never done  . FOOT EXAM  12/09/2019  . HEMOGLOBIN A1C  01/19/2021  . OPHTHALMOLOGY EXAM  05/01/2021  . INFLUENZA VACCINE  Completed  . COVID-19 Vaccine  Completed  . PNA vac Low Risk Adult  Completed       Assessment  This is a routine wellness examination for TEMPERANCE KELEMEN.  Health Maintenance: Due or Overdue Health Maintenance Due  Topic Date Due  . TETANUS/TDAP  Never done  . DEXA SCAN  Never done  . FOOT EXAM  12/09/2019    Maria Alexander does not need a referral for Community Assistance: Care Management:   no Social Work:    no Prescription Assistance:  no Nutrition/Diabetes Education:  no   Plan:  Personalized Goals Goals Addressed            This Visit's Progress   . Patient Stated       08/02/2020 AWV Goal: Fall Prevention  . Over the next year, patient will decrease their risk for falls by: o Using assistive devices, such as a cane or walker, as needed o Identifying fall risks within their home and correcting them by: - Removing throw rugs - Adding handrails to stairs or ramps - Removing clutter and keeping a clear pathway throughout the home - Increasing light, especially at night - Adding shower handles/bars - Raising toilet seat o Identifying potential personal risk factors for falls: - Medication side  effects - Incontinence/urgency - Vestibular dysfunction - Hearing loss - Musculoskeletal disorders - Neurological disorders - Orthostatic hypotension        Personalized Health Maintenance & Screening Recommendations  Td vaccine  Lung Cancer Screening  Recommended: no (Low Dose CT Chest recommended if Age 55-80 years, 30 pack-year currently smoking OR have quit w/in past 15 years) Hepatitis C Screening recommended: no HIV Screening recommended: no  Advanced Directives: Written information was not prepared per patient's request.  Referrals & Orders No orders of the defined types were placed in this encounter.   Follow-up Plan . Follow-up with Maria Norlander, DO as planned    I have personally reviewed and noted the following in the patient's chart:   . Medical and social history . Use of alcohol, tobacco or illicit drugs  . Current medications and supplements . Functional ability and status . Nutritional status . Physical activity . Advanced directives . List of other physicians . Hospitalizations, surgeries, and ER visits in previous 12 months . Vitals . Screenings to include cognitive, depression, and falls . Referrals and appointments  In addition, I have reviewed and discussed with Maria Alexander certain preventive protocols, quality metrics, and best practice recommendations. A written personalized care plan for preventive services as well as general preventive health recommendations is available and can be mailed to the patient at her request.      Felicity Coyer, LPN     08/31/7423   Patient declined after visit summary

## 2020-08-02 NOTE — Telephone Encounter (Signed)
Patient would like to know if you have heard anything from her [name] patient assistance yet.  She is aware you are out of the office today.

## 2020-08-05 NOTE — Telephone Encounter (Signed)
I have heard nothing but as we previously discussed it can take up to 2 months before she is contacted.

## 2020-08-05 NOTE — Telephone Encounter (Signed)
Patient aware.

## 2020-08-21 ENCOUNTER — Telehealth: Payer: Self-pay

## 2020-08-21 NOTE — Telephone Encounter (Signed)
No samples available . I can start prescription assistance  through [facility] if you like.

## 2020-08-21 NOTE — Telephone Encounter (Signed)
Dr Darnell Level: We do not have any samples of [name] at this time. She has been out for almost 3 mos and pt assistance is still pending. Is there something she can take in the mean time?  It is $150/mo at the pharm for her.  Any suggestions?

## 2020-08-21 NOTE — Telephone Encounter (Signed)
I think we've completed her forms but if it will get her meds faster.  Anything would help.

## 2020-08-21 NOTE — Telephone Encounter (Signed)
Have you heard any word on her assistance?

## 2020-08-21 NOTE — Telephone Encounter (Signed)
Informed pt that after calling [employer] they have mailed her ppw to finish and mail back to them, if she has not seen this in the next week she will call them.

## 2020-08-21 NOTE — Telephone Encounter (Signed)
Not sure if there are samples.  Have only seen Janumet in there recently.  Double check and if we do, please give patient some.  Will have Cathy check on her [name] patient assistance.

## 2020-11-15 ENCOUNTER — Other Ambulatory Visit: Payer: Self-pay

## 2020-11-15 ENCOUNTER — Encounter: Payer: Self-pay | Admitting: Family Medicine

## 2020-11-15 ENCOUNTER — Ambulatory Visit (INDEPENDENT_AMBULATORY_CARE_PROVIDER_SITE_OTHER): Payer: Medicare Other | Admitting: Family Medicine

## 2020-11-15 VITALS — BP 147/80 | HR 75 | Temp 98.3°F | Ht 60.0 in | Wt 129.4 lb

## 2020-11-15 DIAGNOSIS — F419 Anxiety disorder, unspecified: Secondary | ICD-10-CM | POA: Diagnosis not present

## 2020-11-15 DIAGNOSIS — I152 Hypertension secondary to endocrine disorders: Secondary | ICD-10-CM | POA: Diagnosis not present

## 2020-11-15 DIAGNOSIS — E1169 Type 2 diabetes mellitus with other specified complication: Secondary | ICD-10-CM

## 2020-11-15 DIAGNOSIS — L819 Disorder of pigmentation, unspecified: Secondary | ICD-10-CM

## 2020-11-15 DIAGNOSIS — E785 Hyperlipidemia, unspecified: Secondary | ICD-10-CM | POA: Diagnosis not present

## 2020-11-15 DIAGNOSIS — E1159 Type 2 diabetes mellitus with other circulatory complications: Secondary | ICD-10-CM | POA: Diagnosis not present

## 2020-11-15 LAB — BAYER DCA HB A1C WAIVED: HB A1C (BAYER DCA - WAIVED): 7.1 % — ABNORMAL HIGH (ref <7.0)

## 2020-11-15 MED ORDER — ALPRAZOLAM 1 MG PO TABS: 0.5000 mg | ORAL_TABLET | Freq: Every evening | ORAL | 3 refills | Status: DC | PRN

## 2020-11-15 NOTE — Progress Notes (Signed)
Subjective: CC: DM PCP: Janora Norlander, DO XKG:YJEHUDJ W Villeda is a 87 y.o. female presenting to clinic today for:  1. Type 2 Diabetes with hypertension, hyperlipidemia:  Patient is compliant with Lipitor 40 mg daily, glipizide 10 mg daily and [name] 50 mg a daily.  She is also treated with losartan for blood pressure.  No chest pain, shortness of breath, dizziness.  She does not have a restrictive diet and eats what she wants.  Mobility is somewhat limited and therefore she does not perform standing physical exercise but does try to get in seated physical exercise as much as possible.  Last eye exam: Up-to-date Last foot exam: Declines because she has stockings on today Last A1c:  Lab Results  Component Value Date   HGBA1C 8.2 (H) 07/22/2020   Nephropathy screen indicated?:  On ARB Last flu, zoster and/or pneumovax:  Immunization History  Administered Date(s) Administered  . Fluad Quad(high Dose 65+) 04/01/2020  . Influenza, High Dose Seasonal PF 04/04/2018  . Influenza,inj,Quad PF,6+ Mos 03/31/2017  . Influenza-Unspecified 04/07/2016, 04/30/2017, 04/04/2018, 02/28/2019  . Moderna Sars-Covid-2 Vaccination 07/11/2019, 08/11/2019, 04/24/2020  . Pneumococcal Conjugate-13 08/22/2014  . Pneumococcal Polysaccharide-23 04/11/1997  . Zoster Recombinat (Shingrix) 03/22/2017, 06/02/2017    2.  Skin lesion Patient reports a large skin lesion on her mid back.  Her mother passed away from complications of a melanoma and she worries about this.  She sees a dermatologist and they have recommended that she keep an eye on this lesion.  She denies any spontaneous bleeding, itching.  Sometimes it does seem to get rubbed by her bra.  3.  Anxiety disorder Patient takes alprazolam at bedtime as needed.  Has a few refills left.  No reports of falls or excessive daytime sedation.  No visual or auditory hallucinations.  Memory is good.  ROS: Per HPI  Allergies  Allergen Reactions  . Shellfish  Allergy     hives   Past Medical History:  Diagnosis Date  . Diabetes ([place])   . DVT (deep venous thrombosis) ([place])   . High cholesterol   . Hypothyroid   . Stroke [facility])     Current Outpatient Medications:  .  ALPRAZolam (XANAX) 1 MG tablet, Take 0.5-1 tablets (0.5-1 mg total) by mouth at bedtime as needed for anxiety., Disp: 30 tablet, Rfl: 5 .  Aspirin-Acetaminophen-Caffeine (GOODY HEADACHE PO), Take by mouth every morning., Disp: , Rfl:  .  atorvastatin (LIPITOR) 80 MG tablet, Take 1/2 tablet (40 mg total) by mouth daily., Disp: 90 tablet, Rfl: 3 .  conjugated estrogens (PREMARIN) vaginal cream, Place 1 Applicatorful vaginally once a week., Disp: 42.5 g, Rfl: 0 .  glipiZIDE (GLUCOTROL XL) 5 MG 24 hr tablet, Take 2 tablets (10 mg total) by mouth daily with breakfast., Disp: 180 tablet, Rfl: 3 .  levothyroxine (SYNTHROID) 50 MCG tablet, Take 1 tablet (50 mcg total) by mouth daily before breakfast., Disp: 90 tablet, Rfl: 3 .  loratadine (CLARITIN) 10 MG tablet, TAKE 1 TABLET DAILY, Disp: 30 tablet, Rfl: 5 .  losartan (COZAAR) 100 MG tablet, Take 1 tablet (100 mg total) by mouth daily., Disp: 90 tablet, Rfl: 3 .  Multiple Vitamin (MULTIVITAMIN) tablet, Take 1 tablet by mouth daily., Disp: , Rfl:  .  sitaGLIPtin ([name]) 50 MG tablet, Take 1 tablet (50 mg total) by mouth daily., Disp: 90 tablet, Rfl: 3 Social History   Socioeconomic History  . Marital status: Widowed    Spouse name: Not on file  .  Number of children: 3  . Years of education: 10  . Highest education level: 10th grade  Occupational History  . Occupation: retired  Tobacco Use  . Smoking status: Former Smoker    Types: Cigarettes    Quit date: 02/05/1995    Years since quitting: 25.7  . Smokeless tobacco: Never Used  Vaping Use  . Vaping Use: Never used  Substance and Sexual Activity  . Alcohol use: No    Alcohol/week: 0.0 standard drinks  . Drug use: No  . Sexual activity: Not Currently    Birth  control/protection: None  Other Topics Concern  . Not on file  Social History Narrative  . Not on file   Social Determinants of Health   Financial Resource Strain: Not on file  Food Insecurity: Not on file  Transportation Needs: Not on file  Physical Activity: Not on file  Stress: Not on file  Social Connections: Not on file  Intimate Partner Violence: Not on file   Family History  Problem Relation Age of Onset  . Cancer Mother   . Heart disease Father        before age 74  . Heart attack Father   . Diabetes Sister   . Diabetes Brother     Objective: Office vital signs reviewed. BP (!) 147/80   Pulse 75   Temp 98.3 F (36.8 C)   Ht 5' (1.524 m)   Wt 129 lb 6.4 oz (58.7 kg)   SpO2 94%   BMI 25.27 kg/m   Physical Examination:  General: Awake, alert, well-appearing, well-nourished elderly female, No acute distress Cardio: regular rate and rhythm, S1S2 heard, no murmurs appreciated Pulm: clear to auscultation bilaterally, no wheezes, rhonchi or rales; normal work of breathing on room air MSK: Slow gait.  She has a hunched station.  Thoracic kyphosis notable Skin: Irregularly shaped pigmented skin lesion that is raised with mild scaling and "stuck on appearance".  No central umbilication or hypervascularity noted.  Assessment/ Plan: 87 y.o. female   Type 2 diabetes mellitus with other specified complication, without long-term current use of insulin ([place]) - Plan: Bayer DCA Hb A1c Waived  Hypertension associated with diabetes ([place])  Hyperlipidemia associated with type 2 diabetes mellitus ([place])  Anxiety - Plan: ALPRAZolam (XANAX) 1 MG tablet  Pigmented skin lesion of uncertain behavior of torso  Sugar under excellent control for age.  I think 7.1 A1c is extremely reasonable for this geriatric patient.  She demonstrates no symptoms or signs or reports of hypoglycemia so no changes to medication is needed  Blood pressure well controlled for age  Continue statin at  40 mg daily  Alprazolam renewed and this will be placed on file she still has 2 prescriptions left of the previous Rx sent.  She may follow-up in 6 months, sooner if needed  Clinically the skin lesion appears to be a seborrheic keratosis.  She is not inclined to do any testing or treatment and therefore no biopsy was performed.  We will continue to follow  Orders Placed This Encounter  Procedures  . Bayer DCA Hb A1c Waived   No orders of the defined types were placed in this encounter.    Janora Norlander, DO [place] [phone]

## 2020-11-15 NOTE — Patient Instructions (Signed)
Sugar is looking MUCH better.  A1c 7.1 today.  No changes needed.

## 2021-01-23 DIAGNOSIS — D0439 Carcinoma in situ of skin of other parts of face: Secondary | ICD-10-CM | POA: Diagnosis not present

## 2021-01-23 DIAGNOSIS — C44629 Squamous cell carcinoma of skin of left upper limb, including shoulder: Secondary | ICD-10-CM | POA: Diagnosis not present

## 2021-01-23 DIAGNOSIS — C44529 Squamous cell carcinoma of skin of other part of trunk: Secondary | ICD-10-CM | POA: Diagnosis not present

## 2021-02-12 DIAGNOSIS — L570 Actinic keratosis: Secondary | ICD-10-CM | POA: Diagnosis not present

## 2021-02-12 DIAGNOSIS — C44529 Squamous cell carcinoma of skin of other part of trunk: Secondary | ICD-10-CM | POA: Diagnosis not present

## 2021-02-12 DIAGNOSIS — Z85828 Personal history of other malignant neoplasm of skin: Secondary | ICD-10-CM | POA: Diagnosis not present

## 2021-02-12 DIAGNOSIS — X32XXXD Exposure to sunlight, subsequent encounter: Secondary | ICD-10-CM | POA: Diagnosis not present

## 2021-02-12 DIAGNOSIS — C44629 Squamous cell carcinoma of skin of left upper limb, including shoulder: Secondary | ICD-10-CM | POA: Diagnosis not present

## 2021-02-12 DIAGNOSIS — Z08 Encounter for follow-up examination after completed treatment for malignant neoplasm: Secondary | ICD-10-CM | POA: Diagnosis not present

## 2021-03-04 ENCOUNTER — Other Ambulatory Visit: Payer: Self-pay

## 2021-03-04 ENCOUNTER — Encounter (INDEPENDENT_AMBULATORY_CARE_PROVIDER_SITE_OTHER): Payer: Self-pay

## 2021-03-04 ENCOUNTER — Ambulatory Visit (INDEPENDENT_AMBULATORY_CARE_PROVIDER_SITE_OTHER): Payer: Medicare Other | Admitting: Internal Medicine

## 2021-03-04 ENCOUNTER — Encounter (INDEPENDENT_AMBULATORY_CARE_PROVIDER_SITE_OTHER): Payer: Self-pay | Admitting: Internal Medicine

## 2021-03-04 ENCOUNTER — Other Ambulatory Visit (INDEPENDENT_AMBULATORY_CARE_PROVIDER_SITE_OTHER): Payer: Self-pay

## 2021-03-04 DIAGNOSIS — R1319 Other dysphagia: Secondary | ICD-10-CM

## 2021-03-04 DIAGNOSIS — Z8719 Personal history of other diseases of the digestive system: Secondary | ICD-10-CM

## 2021-03-04 NOTE — H&P (View-Only) (Signed)
Presenting complaint;  Dysphagia.  Database and subjective:  Patient is a 87 year old Caucasian female who is here for scheduled visit accompanied by her daughter Constance Holster. She underwent barium study in December 2015 for dysphagia which revealed mild narrowing at GE junction causing temporary hold-up of barium pill.  When she came to our office and in January 2016 she reported she was not having any difficulty in swallowing and we therefore decided to monitor her clinical course.  She presents with 1 year history of intermittent solid food dysphagia which has been occurring more and more frequently.  It is experienced primarily with meat and bread.  She points to lower sternal area as site of bolus obstruction.  She states she is having at least 2 episodes of food impaction a month which are relieved spontaneously or by regurgitating food bolus.  Her daughter thinks she is having more episodes.  She states she is a slow eater.  She denies heartburn.  She denies hematemesis melena or rectal bleeding.  Bowels move daily.  She denies melena or rectal bleeding. She takes 1 Goody powder every day which she has done for at least 8 years.  She takes it for headache. She states her last esophageal dilation was 7 years ago at [facility] in need of [place].  We do not have any records of that.  She did have an EGD by Dr. Benson Norway in 2002 for heme positive stool and was a normal exam. She is widowed.  She used to smoke cigarettes but quit in 1996.  She does not drink alcohol.  She states she has twin sister who is also doing well like she has.  Current Medications: Outpatient Encounter Medications as of 03/04/2021  Medication Sig   ALPRAZolam (XANAX) 1 MG tablet Take 0.5-1 tablets (0.5-1 mg total) by mouth at bedtime as needed for anxiety (place on file.).   Aspirin-Acetaminophen-Caffeine (GOODY HEADACHE PO) Take by mouth every morning.   atorvastatin (LIPITOR) 80 MG tablet Take 1/2 tablet (40 mg total) by  mouth daily.   glipiZIDE (GLUCOTROL XL) 5 MG 24 hr tablet Take 2 tablets (10 mg total) by mouth daily with breakfast.   levothyroxine (SYNTHROID) 50 MCG tablet Take 1 tablet (50 mcg total) by mouth daily before breakfast.   losartan (COZAAR) 100 MG tablet Take 1 tablet (100 mg total) by mouth daily.   Multiple Vitamin (MULTIVITAMIN) tablet Take 1 tablet by mouth daily.   sitaGLIPtin ([name]) 50 MG tablet Take 1 tablet (50 mg total) by mouth daily.   [DISCONTINUED] conjugated estrogens (PREMARIN) vaginal cream Place 1 Applicatorful vaginally once a week.   [DISCONTINUED] loratadine (CLARITIN) 10 MG tablet TAKE 1 TABLET DAILY   No facility-administered encounter medications on file as of 03/04/2021.   Past Medical History:  Diagnosis Date   Diabetes ([place])    DVT (deep venous thrombosis) (HCC)    High cholesterol    Hypothyroid    Stroke [facility])       Screening colonoscopy in October 2011 revealing 2 diverticuli sigmoid colon.   Objective: Blood pressure 138/69, pulse 86, temperature 98.8 F (37.1 C), temperature source Oral, height 5' (1.524 m), weight 129 lb 1.6 oz (58.6 kg). Patient is alert and in no acute distress. Conjunctiva is pink. Sclera is nonicteric Oropharyngeal mucosa is normal. Dentition in satisfactory condition. No neck masses or thyromegaly noted. Cardiac exam with regular rhythm normal S1 and S2. No murmur or gallop noted. Lungs are clear to auscultation. Abdomen is symmetrical soft and  nontender with organomegaly or masses. No LE edema or clubbing noted.  Labs/studies Results: Chemistry panel from 07/22/2018 was reviewed. Glucose 268 otherwise normal  Barium study from June 07, 2014 reviewed. No evidence of esophageal dysmotility.  Mild narrowing at GE junction causing delay in passage of barium pill into the stomach.  Assessment:  #1.  Esophageal dysphagia.  Patient presents with solid food dysphagia.  She has history of distal esophageal stricture  documented on barium study of December 2015 but she has not required dilation.  Last dilation possibly was over 15 years ago.  She does not have heartburn. She would benefit from esophageal dilation.  Plan:  Try to obtain records of prior EGD and ED from [facility]. Schedule esophagogastroduodenoscopy with esophageal dilation under monitored anesthesia care in near future. Patient advised not to take Goody powder for at least 2 days prior to procedure. Office visit on as-needed basis.

## 2021-03-04 NOTE — Patient Instructions (Signed)
Esophagogastroduodenoscopy with esophageal dilation to be scheduled in the near future. Please remember not to take aspirin/Goody powder for 2 days before the procedure.

## 2021-03-04 NOTE — Progress Notes (Signed)
Presenting complaint;  Dysphagia.  Database and subjective:  Patient is a 87 year old Caucasian female who is here for scheduled visit accompanied by her daughter Constance Holster. She underwent barium study in December 2015 for dysphagia which revealed mild narrowing at GE junction causing temporary hold-up of barium pill.  When she came to our office and in January 2016 she reported she was not having any difficulty in swallowing and we therefore decided to monitor her clinical course.  She presents with 1 year history of intermittent solid food dysphagia which has been occurring more and more frequently.  It is experienced primarily with meat and bread.  She points to lower sternal area as site of bolus obstruction.  She states she is having at least 2 episodes of food impaction a month which are relieved spontaneously or by regurgitating food bolus.  Her daughter thinks she is having more episodes.  She states she is a slow eater.  She denies heartburn.  She denies hematemesis melena or rectal bleeding.  Bowels move daily.  She denies melena or rectal bleeding. She takes 1 Goody powder every day which she has done for at least 8 years.  She takes it for headache. She states her last esophageal dilation was 7 years ago at [facility] in need of [place].  We do not have any records of that.  She did have an EGD by Dr. Benson Norway in 2002 for heme positive stool and was a normal exam. She is widowed.  She used to smoke cigarettes but quit in 1996.  She does not drink alcohol.  She states she has twin sister who is also doing well like she has.  Current Medications: Outpatient Encounter Medications as of 03/04/2021  Medication Sig   ALPRAZolam (XANAX) 1 MG tablet Take 0.5-1 tablets (0.5-1 mg total) by mouth at bedtime as needed for anxiety (place on file.).   Aspirin-Acetaminophen-Caffeine (GOODY HEADACHE PO) Take by mouth every morning.   atorvastatin (LIPITOR) 80 MG tablet Take 1/2 tablet (40 mg total) by  mouth daily.   glipiZIDE (GLUCOTROL XL) 5 MG 24 hr tablet Take 2 tablets (10 mg total) by mouth daily with breakfast.   levothyroxine (SYNTHROID) 50 MCG tablet Take 1 tablet (50 mcg total) by mouth daily before breakfast.   losartan (COZAAR) 100 MG tablet Take 1 tablet (100 mg total) by mouth daily.   Multiple Vitamin (MULTIVITAMIN) tablet Take 1 tablet by mouth daily.   sitaGLIPtin ([name]) 50 MG tablet Take 1 tablet (50 mg total) by mouth daily.   [DISCONTINUED] conjugated estrogens (PREMARIN) vaginal cream Place 1 Applicatorful vaginally once a week.   [DISCONTINUED] loratadine (CLARITIN) 10 MG tablet TAKE 1 TABLET DAILY   No facility-administered encounter medications on file as of 03/04/2021.   Past Medical History:  Diagnosis Date   Diabetes ([place])    DVT (deep venous thrombosis) (HCC)    High cholesterol    Hypothyroid    Stroke [facility])       Screening colonoscopy in October 2011 revealing 2 diverticuli sigmoid colon.   Objective: Blood pressure 138/69, pulse 86, temperature 98.8 F (37.1 C), temperature source Oral, height 5' (1.524 m), weight 129 lb 1.6 oz (58.6 kg). Patient is alert and in no acute distress. Conjunctiva is pink. Sclera is nonicteric Oropharyngeal mucosa is normal. Dentition in satisfactory condition. No neck masses or thyromegaly noted. Cardiac exam with regular rhythm normal S1 and S2. No murmur or gallop noted. Lungs are clear to auscultation. Abdomen is symmetrical soft and  nontender with organomegaly or masses. No LE edema or clubbing noted.  Labs/studies Results: Chemistry panel from 07/22/2018 was reviewed. Glucose 268 otherwise normal  Barium study from June 07, 2014 reviewed. No evidence of esophageal dysmotility.  Mild narrowing at GE junction causing delay in passage of barium pill into the stomach.  Assessment:  #1.  Esophageal dysphagia.  Patient presents with solid food dysphagia.  She has history of distal esophageal stricture  documented on barium study of December 2015 but she has not required dilation.  Last dilation possibly was over 15 years ago.  She does not have heartburn. She would benefit from esophageal dilation.  Plan:  Try to obtain records of prior EGD and ED from [facility]. Schedule esophagogastroduodenoscopy with esophageal dilation under monitored anesthesia care in near future. Patient advised not to take Goody powder for at least 2 days prior to procedure. Office visit on as-needed basis.

## 2021-03-12 NOTE — Patient Instructions (Signed)
Maria Alexander  03/12/2021     '@PREFPERIOPPHARMACY'$ @   Your procedure is scheduled on  03/19/2021.   Report to [facility] at  [place] A.M.   Call this number if you have problems the morning of surgery:  [phone]   Remember:  Follow the diet instructions given to you by the office.    Take these medicines the morning of surgery with A SIP OF WATER                                     levothyroxine.     Do not wear jewelry, make-up or nail polish.  Do not wear lotions, powders, or perfumes, or deodorant.  Do not shave 48 hours prior to surgery.  Men may shave face and neck.  Do not bring valuables to the hospital.  [facility] is not responsible for any belongings or valuables.  Contacts, dentures or bridgework may not be worn into surgery.  Leave your suitcase in the car.  After surgery it may be brought to your room.  For patients admitted to the hospital, discharge time will be determined by your treatment team.  Patients discharged the day of surgery will not be allowed to drive home and must have someone with them for 24 hours.    Special instructions:   DO NOT smoke tobacco or vape for 24 hours before your procedure.  Please read over the following fact sheets that you were given. Anesthesia Post-op Instructions and Care and Recovery After Surgery      Upper Endoscopy, Adult, Care After This sheet gives you information about how to care for yourself after your procedure. Your health care provider may also give you more specific instructions. If you have problems or questions, contact your health care provider. What can I expect after the procedure? After the procedure, it is common to have: A sore throat. Mild stomach pain or discomfort. Bloating. Nausea. Follow these instructions at home:  Follow instructions from your health care provider about what to eat or drink after your procedure. Return to your normal activities as told by your health  care provider. Ask your health care provider what activities are safe for you. Take over-the-counter and prescription medicines only as told by your health care provider. If you were given a sedative during the procedure, it can affect you for several hours. Do not drive or operate machinery until your health care provider says that it is safe. Keep all follow-up visits as told by your health care provider. This is important. Contact a health care provider if you have: A sore throat that lasts longer than one day. Trouble swallowing. Get help right away if: You vomit blood or your vomit looks like coffee grounds. You have: A fever. Bloody, black, or tarry stools. A severe sore throat or you cannot swallow. Difficulty breathing. Severe pain in your chest or abdomen. Summary After the procedure, it is common to have a sore throat, mild stomach discomfort, bloating, and nausea. If you were given a sedative during the procedure, it can affect you for several hours. Do not drive or operate machinery until your health care provider says that it is safe. Follow instructions from your health care provider about what to eat or drink after your procedure. Return to your normal activities as told by your health care provider. This information is not  intended to replace advice given to you by your health care provider. Make sure you discuss any questions you have with your health care provider. Document Revised: 06/13/2019 Document Reviewed: 11/15/2017 Elsevier Patient Education  2022 [place]. Esophageal Dilatation Esophageal dilatation, also called esophageal dilation, is a procedure to widen or open a blocked or narrowed part of the esophagus. The esophagus is the part of the body that moves food and liquid from the mouth to the stomach. You may need this procedure if: You have a buildup of scar tissue in your esophagus that makes it difficult, painful, or impossible to swallow. This can be  caused by gastroesophageal reflux disease (GERD). You have cancer of the esophagus. There is a problem with how food moves through your esophagus. In some cases, you may need this procedure repeated at a later time to dilate the esophagus gradually. Tell a health care provider about: Any allergies you have. All medicines you are taking, including vitamins, herbs, eye drops, creams, and over-the-counter medicines. Any problems you or family members have had with anesthetic medicines. Any blood disorders you have. Any surgeries you have had. Any medical conditions you have. Any antibiotic medicines you are required to take before dental procedures. Whether you are pregnant or may be pregnant. What are the risks? Generally, this is a safe procedure. However, problems may occur, including: Bleeding due to a tear in the lining of the esophagus. A hole, or perforation, in the esophagus. What happens before the procedure? Ask your health care provider about: Changing or stopping your regular medicines. This is especially important if you are taking diabetes medicines or blood thinners. Taking medicines such as aspirin and ibuprofen. These medicines can thin your blood. Do not take these medicines unless your health care provider tells you to take them. Taking over-the-counter medicines, vitamins, herbs, and supplements. Follow instructions from your health care provider about eating or drinking restrictions. Plan to have a responsible adult take you home from the hospital or clinic. Plan to have a responsible adult care for you for the time you are told after you leave the hospital or clinic. This is important. What happens during the procedure? You may be given a medicine to help you relax (sedative). A numbing medicine may be sprayed into the back of your throat, or you may gargle the medicine. Your health care provider may perform the dilatation using various surgical instruments, such  as: Simple dilators. This instrument is carefully placed in the esophagus to stretch it. Guided wire bougies. This involves using an endoscope to insert a wire into the esophagus. A dilator is passed over this wire to enlarge the esophagus. Then the wire is removed. Balloon dilators. An endoscope with a small balloon is inserted into the esophagus. The balloon is inflated to stretch the esophagus and open it up. The procedure may vary among health care providers and hospitals. What can I expect after the procedure? Your blood pressure, heart rate, breathing rate, and blood oxygen level will be monitored until you leave the hospital or clinic. Your throat may feel slightly sore and numb. This will get better over time. You will not be allowed to eat or drink until your throat is no longer numb. When you are able to drink, urinate, and sit on the edge of the bed without nausea or dizziness, you may be able to return home. Follow these instructions at home: Take over-the-counter and prescription medicines only as told by your health care provider. If you  were given a sedative during the procedure, it can affect you for several hours. Do not drive or operate machinery until your health care provider says that it is safe. Plan to have a responsible adult care for you for the time you are told. This is important. Follow instructions from your health care provider about any eating or drinking restrictions. Do not use any products that contain nicotine or tobacco, such as cigarettes, e-cigarettes, and chewing tobacco. If you need help quitting, ask your health care provider. Keep all follow-up visits. This is important. Contact a health care provider if: You have a fever. You have pain that is not relieved by medicine. Get help right away if: You have chest pain. You have trouble breathing. You have trouble swallowing. You vomit blood. You have black, tarry, or bloody stools. These symptoms may  represent a serious problem that is an emergency. Do not wait to see if the symptoms will go away. Get medical help right away. Call your local emergency services (911 in the [place] Do not drive yourself to the hospital. Summary Esophageal dilatation, also called esophageal dilation, is a procedure to widen or open a blocked or narrowed part of the esophagus. Plan to have a responsible adult take you home from the hospital or clinic. For this procedure, a numbing medicine may be sprayed into the back of your throat, or you may gargle the medicine. Do not drive or operate machinery until your health care provider says that it is safe. This information is not intended to replace advice given to you by your health care provider. Make sure you discuss any questions you have with your health care provider. Document Revised: 11/01/2019 Document Reviewed: 11/01/2019 Elsevier Patient Education  [place] After This sheet gives you information about how to care for yourself after your procedure. Your health care provider may also give you more specific instructions. If you have problems or questions, contact your health care provider. What can I expect after the procedure? After the procedure, it is common to have: Tiredness. Forgetfulness about what happened after the procedure. Impaired judgment for important decisions. Nausea or vomiting. Some difficulty with balance. Follow these instructions at home: For the time period you were told by your health care provider:   Rest as needed. Do not participate in activities where you could fall or become injured. Do not drive or use machinery. Do not drink alcohol. Do not take sleeping pills or medicines that cause drowsiness. Do not make important decisions or sign legal documents. Do not take care of children on your own. Eating and drinking Follow the diet that is recommended by your health care  provider. Drink enough fluid to keep your urine pale yellow. If you vomit: Drink water, juice, or soup when you can drink without vomiting. Make sure you have little or no nausea before eating solid foods. General instructions Have a responsible adult stay with you for the time you are told. It is important to have someone help care for you until you are awake and alert. Take over-the-counter and prescription medicines only as told by your health care provider. If you have sleep apnea, surgery and certain medicines can increase your risk for breathing problems. Follow instructions from your health care provider about wearing your sleep device: Anytime you are sleeping, including during daytime naps. While taking prescription pain medicines, sleeping medicines, or medicines that make you drowsy. Avoid smoking. Keep all follow-up visits as told by  your health care provider. This is important. Contact a health care provider if: You keep feeling nauseous or you keep vomiting. You feel light-headed. You are still sleepy or having trouble with balance after 24 hours. You develop a rash. You have a fever. You have redness or swelling around the IV site. Get help right away if: You have trouble breathing. You have new-onset confusion at home. Summary For several hours after your procedure, you may feel tired. You may also be forgetful and have poor judgment. Have a responsible adult stay with you for the time you are told. It is important to have someone help care for you until you are awake and alert. Rest as told. Do not drive or operate machinery. Do not drink alcohol or take sleeping pills. Get help right away if you have trouble breathing, or if you suddenly become confused. This information is not intended to replace advice given to you by your health care provider. Make sure you discuss any questions you have with your health care provider. Document Revised: 02/29/2020 Document Reviewed:  05/18/2019 Elsevier Patient Education  2022 [employer].

## 2021-03-17 ENCOUNTER — Encounter (HOSPITAL_COMMUNITY)
Admission: RE | Admit: 2021-03-17 | Discharge: 2021-03-17 | Disposition: A | Discharge status: Home / Self Care | Payer: Medicare Other | Source: Ambulatory Visit | Attending: Internal Medicine | Admitting: Internal Medicine

## 2021-03-17 ENCOUNTER — Other Ambulatory Visit: Payer: Self-pay

## 2021-03-17 ENCOUNTER — Encounter (HOSPITAL_COMMUNITY): Payer: Self-pay

## 2021-03-17 DIAGNOSIS — Z8719 Personal history of other diseases of the digestive system: Secondary | ICD-10-CM | POA: Diagnosis not present

## 2021-03-17 DIAGNOSIS — Z01818 Encounter for other preprocedural examination: Secondary | ICD-10-CM | POA: Diagnosis not present

## 2021-03-17 DIAGNOSIS — R1319 Other dysphagia: Secondary | ICD-10-CM | POA: Insufficient documentation

## 2021-03-17 LAB — BASIC METABOLIC PANEL
Anion gap: 7 (ref 5–15)
BUN: 14 mg/dL (ref 8–23)
CO2: 26 mmol/L (ref 22–32)
Calcium: 9.1 mg/dL (ref 8.9–10.3)
Chloride: 103 mmol/L (ref 98–111)
Creatinine, Ser: 0.88 mg/dL (ref 0.44–1.00)
GFR, Estimated: >60 mL/min (ref >60)
Glucose, Bld: 111 mg/dL — ABNORMAL HIGH (ref 70–99)
Potassium: 4 mmol/L (ref 3.5–5.1)
Sodium: 136 mmol/L (ref 135–145)

## 2021-03-19 ENCOUNTER — Ambulatory Visit (HOSPITAL_COMMUNITY)
Admission: RE | Admit: 2021-03-19 | Discharge: 2021-03-19 | Disposition: A | Discharge status: Home / Self Care | Payer: Medicare Other | Attending: Internal Medicine | Admitting: Internal Medicine

## 2021-03-19 ENCOUNTER — Encounter (HOSPITAL_COMMUNITY): Payer: Self-pay | Admitting: Internal Medicine

## 2021-03-19 ENCOUNTER — Encounter (INDEPENDENT_AMBULATORY_CARE_PROVIDER_SITE_OTHER): Payer: Self-pay | Admitting: *Deleted

## 2021-03-19 ENCOUNTER — Ambulatory Visit (HOSPITAL_COMMUNITY): Payer: Medicare Other | Admitting: Anesthesiology

## 2021-03-19 ENCOUNTER — Encounter (HOSPITAL_COMMUNITY)
Admission: RE | Disposition: A | Discharge status: Home / Self Care | Payer: Self-pay | Source: Home / Self Care | Attending: Internal Medicine

## 2021-03-19 ENCOUNTER — Other Ambulatory Visit: Payer: Self-pay

## 2021-03-19 DIAGNOSIS — E119 Type 2 diabetes mellitus without complications: Secondary | ICD-10-CM | POA: Diagnosis not present

## 2021-03-19 DIAGNOSIS — K2980 Duodenitis without bleeding: Secondary | ICD-10-CM | POA: Insufficient documentation

## 2021-03-19 DIAGNOSIS — K315 Obstruction of duodenum: Secondary | ICD-10-CM | POA: Insufficient documentation

## 2021-03-19 DIAGNOSIS — Z7984 Long term (current) use of oral hypoglycemic drugs: Secondary | ICD-10-CM | POA: Diagnosis not present

## 2021-03-19 DIAGNOSIS — R1319 Other dysphagia: Secondary | ICD-10-CM

## 2021-03-19 DIAGNOSIS — Z8673 Personal history of transient ischemic attack (TIA), and cerebral infarction without residual deficits: Secondary | ICD-10-CM | POA: Insufficient documentation

## 2021-03-19 DIAGNOSIS — Z8719 Personal history of other diseases of the digestive system: Secondary | ICD-10-CM

## 2021-03-19 DIAGNOSIS — Z79899 Other long term (current) drug therapy: Secondary | ICD-10-CM | POA: Diagnosis not present

## 2021-03-19 DIAGNOSIS — Z7982 Long term (current) use of aspirin: Secondary | ICD-10-CM | POA: Insufficient documentation

## 2021-03-19 DIAGNOSIS — E039 Hypothyroidism, unspecified: Secondary | ICD-10-CM | POA: Insufficient documentation

## 2021-03-19 DIAGNOSIS — Z86718 Personal history of other venous thrombosis and embolism: Secondary | ICD-10-CM | POA: Diagnosis not present

## 2021-03-19 DIAGNOSIS — R1314 Dysphagia, pharyngoesophageal phase: Secondary | ICD-10-CM | POA: Insufficient documentation

## 2021-03-19 DIAGNOSIS — K2990 Gastroduodenitis, unspecified, without bleeding: Secondary | ICD-10-CM

## 2021-03-19 DIAGNOSIS — Z87891 Personal history of nicotine dependence: Secondary | ICD-10-CM | POA: Insufficient documentation

## 2021-03-19 DIAGNOSIS — K449 Diaphragmatic hernia without obstruction or gangrene: Secondary | ICD-10-CM | POA: Diagnosis not present

## 2021-03-19 DIAGNOSIS — K2100 Gastro-esophageal reflux disease with esophagitis, without bleeding: Secondary | ICD-10-CM | POA: Insufficient documentation

## 2021-03-19 DIAGNOSIS — K2970 Gastritis, unspecified, without bleeding: Secondary | ICD-10-CM | POA: Diagnosis not present

## 2021-03-19 DIAGNOSIS — E7800 Pure hypercholesterolemia, unspecified: Secondary | ICD-10-CM | POA: Insufficient documentation

## 2021-03-19 DIAGNOSIS — K222 Esophageal obstruction: Secondary | ICD-10-CM | POA: Insufficient documentation

## 2021-03-19 HISTORY — PX: ESOPHAGEAL DILATION: SHX303

## 2021-03-19 HISTORY — PX: ESOPHAGOGASTRODUODENOSCOPY (EGD) WITH PROPOFOL: SHX5813

## 2021-03-19 LAB — GLUCOSE, CAPILLARY: Glucose-Capillary: 126 mg/dL — ABNORMAL HIGH (ref 70–99)

## 2021-03-19 SURGERY — ESOPHAGOGASTRODUODENOSCOPY (EGD) WITH PROPOFOL
Anesthesia: General

## 2021-03-19 MED ORDER — PANTOPRAZOLE SODIUM 40 MG PO TBEC: 40.0000 mg | DELAYED_RELEASE_TABLET | Freq: Every day | ORAL | 5 refills | Status: DC

## 2021-03-19 MED ORDER — PROPOFOL 10 MG/ML IV BOLUS
INTRAVENOUS | Status: DC | PRN
  Administered 2021-03-19: 30 mg via INTRAVENOUS
  Administered 2021-03-19: 70 mg via INTRAVENOUS
  Administered 2021-03-19: 20 mg via INTRAVENOUS
  Administered 2021-03-19: 30 mg via INTRAVENOUS

## 2021-03-19 MED ORDER — STERILE WATER FOR IRRIGATION IR SOLN
Status: DC | PRN
  Administered 2021-03-19: 100 mL

## 2021-03-19 MED ORDER — LACTATED RINGERS IV SOLN: INTRAVENOUS | Status: DC | PRN

## 2021-03-19 MED ORDER — PHENYLEPHRINE 40 MCG/ML (10ML) SYRINGE FOR IV PUSH (FOR BLOOD PRESSURE SUPPORT)
PREFILLED_SYRINGE | INTRAVENOUS | Status: AC
  Filled 2021-03-19: qty 10

## 2021-03-19 MED ORDER — LACTATED RINGERS IV SOLN: INTRAVENOUS | Status: DC

## 2021-03-19 MED ORDER — PHENYLEPHRINE 40 MCG/ML (10ML) SYRINGE FOR IV PUSH (FOR BLOOD PRESSURE SUPPORT)
PREFILLED_SYRINGE | INTRAVENOUS | Status: DC | PRN
  Administered 2021-03-19: 120 ug via INTRAVENOUS
  Administered 2021-03-19 (×2): 80 ug via INTRAVENOUS

## 2021-03-19 MED ORDER — LIDOCAINE HCL (CARDIAC) PF 100 MG/5ML IV SOSY
PREFILLED_SYRINGE | INTRAVENOUS | Status: DC | PRN
  Administered 2021-03-19: 50 mg via INTRAVENOUS

## 2021-03-19 NOTE — Discharge Instructions (Signed)
No aspirin or NSAIDs for 3 days.  If possible take Tylenol instead of BC or Goody powder. Pantoprazole 40 mg by mouth 30 minutes before breakfast daily. Resume other medications as before. Mechanical soft diet for 24 hours and thereafter usual diet. No driving for 24 hours. Physician will call with results of blood test. Repeat dilation in 1 month.

## 2021-03-19 NOTE — Anesthesia Procedure Notes (Addendum)
Date/Time: 03/19/2021 10:09 AM Performed by: Orlie Dakin, CRNA Pre-anesthesia Checklist: Patient identified, Emergency Drugs available, Suction available and Patient being monitored Patient Re-evaluated:Patient Re-evaluated prior to induction Oxygen Delivery Method: Nasal cannula Induction Type: IV induction Placement Confirmation: positive ETCO2

## 2021-03-19 NOTE — Anesthesia Postprocedure Evaluation (Signed)
Anesthesia Post Note  Patient: Maria Alexander  Procedure(s) Performed: ESOPHAGOGASTRODUODENOSCOPY (EGD) WITH PROPOFOL ESOPHAGEAL DILATION  Anesthesia Type: General Anesthetic complications: no   No notable events documented.   Last Vitals:  Vitals:   03/19/21 0820 03/19/21 1023  BP: (!) 150/73 (!) 130/58  Pulse: 70 62  Resp: (!) 21 (!) 23  Temp: 36.6 C 36.6 C  SpO2: 99% 94%    Last Pain:  Vitals:   03/19/21 1023  TempSrc: Oral  PainSc: 0-No pain                 Louann Sjogren

## 2021-03-19 NOTE — Telephone Encounter (Signed)
Error   This encounter was created in error - please disregard. 

## 2021-03-19 NOTE — Transfer of Care (Signed)
Immediate Anesthesia Transfer of Care Note  Patient: Maria Alexander  Procedure(s) Performed: ESOPHAGOGASTRODUODENOSCOPY (EGD) WITH PROPOFOL ESOPHAGEAL DILATION  Patient Location: Short Stay  Anesthesia Type:General  Level of Consciousness: drowsy  Airway & Oxygen Therapy: Patient Spontanous Breathing  Post-op Assessment: Report given to RN and Post -op Vital signs reviewed and stable  Post vital signs: Reviewed and stable  Last Vitals:  Vitals Value Taken Time  BP    Temp    Pulse    Resp    SpO2      Last Pain:  Vitals:   03/19/21 1004  TempSrc:   PainSc: 0-No pain      Patients Stated Pain Goal: 9 (03/01/00 4996)  Complications: No notable events documented.

## 2021-03-19 NOTE — Interval H&P Note (Signed)
Patient has no new complaints.  She has not taken Goody powder for the last 2 days. No records of prior esophageal dilation at [facility].  She did have EGD by Dr. Lindalou Hose about 18 years ago without dilation. Patient's examination is unchanged. History and Physical Interval Note:  03/19/2021 10:02 AM  Maria Alexander  has presented today for surgery, with the diagnosis of Dysphagia history of esophageal stricture.  The various methods of treatment have been discussed with the patient and family. After consideration of risks, benefits and other options for treatment, the patient has consented to  Procedure(s) with comments: ESOPHAGOGASTRODUODENOSCOPY (EGD) WITH PROPOFOL (N/A) - 9:35 ESOPHAGEAL DILATION (N/A) as a surgical intervention.  The patient's history has been reviewed, patient examined, no change in status, stable for surgery.  I have reviewed the patient's chart and labs.  Questions were answered to the patient's satisfaction.     [employer]

## 2021-03-19 NOTE — Anesthesia Preprocedure Evaluation (Signed)
Anesthesia Evaluation  Patient identified by MRN, date of birth, ID band Patient awake    Reviewed: Allergy & Precautions, H&P , NPO status , Patient's Chart, lab work & pertinent test results, reviewed documented beta blocker date and time   Airway Mallampati: II  TM Distance: >3 FB Neck ROM: full    Dental no notable dental hx.    Pulmonary neg pulmonary ROS, former smoker,    Pulmonary exam normal breath sounds clear to auscultation       Cardiovascular Exercise Tolerance: Good hypertension,  Rhythm:regular Rate:Normal     Neuro/Psych PSYCHIATRIC DISORDERS Anxiety CVA    GI/Hepatic negative GI ROS, Neg liver ROS,   Endo/Other  diabetesHypothyroidism   Renal/GU negative Renal ROS  negative genitourinary   Musculoskeletal   Abdominal   Peds  Hematology negative hematology ROS (+)   Anesthesia Other Findings   Reproductive/Obstetrics negative OB ROS                             Anesthesia Physical Anesthesia Plan  ASA: 3  Anesthesia Plan: General   Post-op Pain Management:    Induction:   PONV Risk Score and Plan: Propofol infusion  Airway Management Planned:   Additional Equipment:   Intra-op Plan:   Post-operative Plan:   Informed Consent: I have reviewed the patients History and Physical, chart, labs and discussed the procedure including the risks, benefits and alternatives for the proposed anesthesia with the patient or authorized representative who has indicated his/her understanding and acceptance.     Dental Advisory Given  Plan Discussed with: CRNA  Anesthesia Plan Comments:         Anesthesia Quick Evaluation

## 2021-03-19 NOTE — Op Note (Addendum)
[facility] Patient Name: Maria Alexander Procedure Date: 03/19/2021 9:50 AM MRN: [id number] Date of Birth: 1923-07-19 Attending MD: Hildred Laser , MD CSN: [id number] Age: 87 Admit Type: Outpatient Procedure:                Upper GI endoscopy Indications:              Esophageal dysphagia Providers:                Hildred Laser, MD, Rosina Lowenstein, RN, Nelma Rothman,                            Technician Referring MD:             Koleen Distance. Gottschalk, DO Medicines:                Propofol per Anesthesia Complications:            No immediate complications. Estimated Blood Loss:     Estimated blood loss was minimal. Procedure:                Pre-Anesthesia Assessment:                           - Prior to the procedure, a History and Physical                            was performed, and patient medications and                            allergies were reviewed. The patient's tolerance of                            previous anesthesia was also reviewed. The risks                            and benefits of the procedure and the sedation                            options and risks were discussed with the patient.                            All questions were answered, and informed consent                            was obtained. Prior Anticoagulants: The patient has                            taken no previous anticoagulant or antiplatelet                            agents except for aspirin. ASA Grade Assessment:                            III - A patient with severe systemic disease. After  reviewing the risks and benefits, the patient was                            deemed in satisfactory condition to undergo the                            procedure.                           After obtaining informed consent, the endoscope was                            passed under direct vision. Throughout the                            procedure, the patient's blood pressure,  pulse, and                            oxygen saturations were monitored continuously. The                            GIF-H190 ([id number]) scope was introduced through the                            mouth, and advanced to the second part of duodenum.                            The upper GI endoscopy was accomplished without                            difficulty. The patient tolerated the procedure                            well. Scope In: 10:08:35 AM Scope Out: 10:18:39 AM Total Procedure Duration: 0 hours 10 minutes 4 seconds  Findings:      The hypopharynx was normal.      LA Grade A (one or more mucosal breaks less than 5 mm, not extending       between tops of 2 mucosal folds) esophagitis with no bleeding was found       30 to 34 cm from the incisors.      One benign-appearing, intrinsic moderate stenosis was found 34 cm from       the incisors. The stenosis was traversed. The dilation site was examined       following endoscope reinsertion and showed moderate mucosal disruption,       moderate improvement in luminal narrowing and no perforation.      A 5 cm hiatal hernia was present.      Patchy mild inflammation characterized by congestion (edema), erosions       and erythema was found in the gastric body and in the gastric antrum.      Patchy moderate inflammation characterized by congestion (edema),       erosions, erythema and friability was found in the duodenal bulb and in       the second portion of the duodenum.      An acquired  benign-appearing, intrinsic mild stenosis was found in the       duodenal bulb and was traversed. Impression:               - Normal hypopharynx.                           - LA Grade A reflux esophagitis with no bleeding.                           - Benign-appearing esophageal stenosis.                           - 5 cm hiatal hernia.                           - Gastritis.                           - Duodenitis.                           - Acquired  duodenal stenosis.                           - No specimens collected. Moderate Sedation:      Per Anesthesia Care Recommendation:           - Patient has a contact number available for                            emergencies. The signs and symptoms of potential                            delayed complications were discussed with the                            patient. Return to normal activities tomorrow.                            Written discharge instructions were provided to the                            patient.                           - Mechanical soft diet today.                           - Continue present medications.                           - No aspirin, ibuprofen, naproxen, or other                            non-steroidal anti-inflammatory drugs for 3 days.                           - H. Pylori serology.                           -  Repeat upper endoscopy in 1 month. Procedure Code(s):        --- Professional ---                           [phone], Esophagogastroduodenoscopy, flexible,                            transoral; diagnostic, including collection of                            specimen(s) by brushing or washing, when performed                            (separate procedure) Diagnosis Code(s):        --- Professional ---                           K21.00, Gastro-esophageal reflux disease with                            esophagitis, without bleeding                           K22.2, Esophageal obstruction                           K44.9, Diaphragmatic hernia without obstruction or                            gangrene                           K29.70, Gastritis, unspecified, without bleeding                           K29.80, Duodenitis without bleeding                           K31.5, Obstruction of duodenum                           R13.14, Dysphagia, pharyngoesophageal phase CPT copyright 2019 American Medical Association. All rights reserved. The codes documented in  this report are preliminary and upon coder review may  be revised to meet current compliance requirements. Hildred Laser, MD Hildred Laser, MD 03/19/2021 10:33:49 AM This report has been signed electronically. Number of Addenda: 0

## 2021-03-20 ENCOUNTER — Other Ambulatory Visit (INDEPENDENT_AMBULATORY_CARE_PROVIDER_SITE_OTHER): Payer: Self-pay

## 2021-03-20 DIAGNOSIS — R1319 Other dysphagia: Secondary | ICD-10-CM

## 2021-03-20 LAB — H. PYLORI ANTIBODY, IGG: H Pylori IgG: 0.21 Index Value (ref 0.00–0.79)

## 2021-03-21 ENCOUNTER — Encounter (INDEPENDENT_AMBULATORY_CARE_PROVIDER_SITE_OTHER): Payer: Self-pay

## 2021-03-31 ENCOUNTER — Encounter (HOSPITAL_COMMUNITY): Payer: Self-pay | Admitting: Internal Medicine

## 2021-04-17 DIAGNOSIS — Z85828 Personal history of other malignant neoplasm of skin: Secondary | ICD-10-CM | POA: Diagnosis not present

## 2021-04-17 DIAGNOSIS — Z08 Encounter for follow-up examination after completed treatment for malignant neoplasm: Secondary | ICD-10-CM | POA: Diagnosis not present

## 2021-04-18 ENCOUNTER — Encounter (HOSPITAL_COMMUNITY)
Admission: RE | Admit: 2021-04-18 | Discharge: 2021-04-18 | Disposition: A | Discharge status: Home / Self Care | Payer: Medicare Other | Source: Ambulatory Visit | Attending: Internal Medicine | Admitting: Internal Medicine

## 2021-04-18 ENCOUNTER — Other Ambulatory Visit: Payer: Self-pay

## 2021-04-23 ENCOUNTER — Ambulatory Visit (HOSPITAL_COMMUNITY): Payer: Medicare Other | Admitting: Anesthesiology

## 2021-04-23 ENCOUNTER — Other Ambulatory Visit: Payer: Self-pay

## 2021-04-23 ENCOUNTER — Encounter (HOSPITAL_COMMUNITY)
Admission: RE | Disposition: A | Discharge status: Home / Self Care | Payer: Self-pay | Source: Home / Self Care | Attending: Internal Medicine

## 2021-04-23 ENCOUNTER — Encounter (HOSPITAL_COMMUNITY): Payer: Self-pay | Admitting: Internal Medicine

## 2021-04-23 ENCOUNTER — Ambulatory Visit (HOSPITAL_COMMUNITY)
Admission: RE | Admit: 2021-04-23 | Discharge: 2021-04-23 | Disposition: A | Discharge status: Home / Self Care | Payer: Medicare Other | Attending: Internal Medicine | Admitting: Internal Medicine

## 2021-04-23 DIAGNOSIS — Z87891 Personal history of nicotine dependence: Secondary | ICD-10-CM | POA: Insufficient documentation

## 2021-04-23 DIAGNOSIS — K449 Diaphragmatic hernia without obstruction or gangrene: Secondary | ICD-10-CM | POA: Insufficient documentation

## 2021-04-23 DIAGNOSIS — K209 Esophagitis, unspecified without bleeding: Secondary | ICD-10-CM | POA: Diagnosis not present

## 2021-04-23 DIAGNOSIS — K3189 Other diseases of stomach and duodenum: Secondary | ICD-10-CM | POA: Diagnosis not present

## 2021-04-23 DIAGNOSIS — Z7984 Long term (current) use of oral hypoglycemic drugs: Secondary | ICD-10-CM | POA: Insufficient documentation

## 2021-04-23 DIAGNOSIS — R1319 Other dysphagia: Secondary | ICD-10-CM

## 2021-04-23 DIAGNOSIS — K222 Esophageal obstruction: Secondary | ICD-10-CM | POA: Insufficient documentation

## 2021-04-23 DIAGNOSIS — E039 Hypothyroidism, unspecified: Secondary | ICD-10-CM | POA: Diagnosis not present

## 2021-04-23 DIAGNOSIS — Z91013 Allergy to seafood: Secondary | ICD-10-CM | POA: Diagnosis not present

## 2021-04-23 DIAGNOSIS — K2289 Other specified disease of esophagus: Secondary | ICD-10-CM | POA: Diagnosis not present

## 2021-04-23 DIAGNOSIS — Z86718 Personal history of other venous thrombosis and embolism: Secondary | ICD-10-CM | POA: Diagnosis not present

## 2021-04-23 DIAGNOSIS — E7800 Pure hypercholesterolemia, unspecified: Secondary | ICD-10-CM | POA: Insufficient documentation

## 2021-04-23 DIAGNOSIS — Z79890 Hormone replacement therapy (postmenopausal): Secondary | ICD-10-CM | POA: Diagnosis not present

## 2021-04-23 DIAGNOSIS — E119 Type 2 diabetes mellitus without complications: Secondary | ICD-10-CM | POA: Insufficient documentation

## 2021-04-23 HISTORY — PX: BIOPSY: SHX5522

## 2021-04-23 HISTORY — PX: ESOPHAGOGASTRODUODENOSCOPY (EGD) WITH PROPOFOL: SHX5813

## 2021-04-23 LAB — GLUCOSE, CAPILLARY: Glucose-Capillary: 122 mg/dL — ABNORMAL HIGH (ref 70–99)

## 2021-04-23 SURGERY — ESOPHAGOGASTRODUODENOSCOPY (EGD) WITH PROPOFOL
Anesthesia: General

## 2021-04-23 MED ORDER — PROPOFOL 10 MG/ML IV BOLUS
INTRAVENOUS | Status: DC | PRN
Start: 2021-04-23 — End: 2021-04-23
  Administered 2021-04-23 (×5): 20 mg via INTRAVENOUS

## 2021-04-23 MED ORDER — LIDOCAINE HCL (CARDIAC) PF 100 MG/5ML IV SOSY
PREFILLED_SYRINGE | INTRAVENOUS | Status: DC | PRN
  Administered 2021-04-23: 40 mg via INTRAVENOUS

## 2021-04-23 MED ORDER — LACTATED RINGERS IV SOLN: INTRAVENOUS | Status: DC

## 2021-04-23 NOTE — H&P (Signed)
Maria Alexander is an 87 y.o. female.   Chief Complaint: Patient is here for esophagogastroduodenoscopy with esophageal dilation. HPI: Patient is 87 year old Caucasian female who underwent esophagogastroduodenoscopy with esophageal dilation on 03/19/2020.  She had erosive esophagitis and moderate stricture was only dilated to 16.5 mm with balloon dilator.  She is returning for repeat dilation.  She sees not taking Goody powder anymore.  She has been taking pantoprazole and not having any heartburn.  Appetite is good and she is not having any swallowing difficulty.  She also denies melena or rectal bleeding. She does not take anticoagulants. Past Medical History:  Diagnosis Date   Diabetes ([place])    DVT (deep venous thrombosis) ([place])    H/O blood clots 2000   High cholesterol    Hypothyroid    Stroke [facility])     Past Surgical History:  Procedure Laterality Date   ESOPHAGEAL DILATION N/A 03/19/2021   Procedure: ESOPHAGEAL DILATION;  Surgeon: Rogene Houston, MD;  Location: [facility];  Service: Endoscopy;  Laterality: N/A;   ESOPHAGOGASTRODUODENOSCOPY (EGD) WITH PROPOFOL N/A 03/19/2021   Procedure: ESOPHAGOGASTRODUODENOSCOPY (EGD) WITH PROPOFOL;  Surgeon: Rogene Houston, MD;  Location: [facility];  Service: Endoscopy;  Laterality: N/A;  9:35   NO PAST SURGERIES      Family History  Problem Relation Age of Onset   Cancer Mother    Heart disease Father        before age 33   Heart attack Father    Diabetes Sister    Diabetes Brother    Social History:  reports that she quit smoking about 26 years ago. Her smoking use included cigarettes. She has never used smokeless tobacco. She reports that she does not drink alcohol and does not use drugs.  Allergies:  Allergies  Allergen Reactions   Shellfish Allergy Hives    Medications Prior to Admission  Medication Sig Dispense Refill   ALPRAZolam (XANAX) 1 MG tablet Take 0.5-1 tablets (0.5-1 mg total) by mouth at bedtime as needed for  anxiety (place on file.). (Patient taking differently: Take 1 mg by mouth at bedtime.) 30 tablet 3   atorvastatin (LIPITOR) 80 MG tablet Take 1/2 tablet (40 mg total) by mouth daily. 90 tablet 3   glipiZIDE (GLUCOTROL XL) 5 MG 24 hr tablet Take 2 tablets (10 mg total) by mouth daily with breakfast. 180 tablet 3   levothyroxine (SYNTHROID) 50 MCG tablet Take 1 tablet (50 mcg total) by mouth daily before breakfast. 90 tablet 3   losartan (COZAAR) 100 MG tablet Take 1 tablet (100 mg total) by mouth daily. 90 tablet 3   Multiple Vitamin (MULTIVITAMIN) tablet Take 1 tablet by mouth daily. Centrum Women's 50+     pantoprazole (PROTONIX) 40 MG tablet Take 1 tablet (40 mg total) by mouth daily before breakfast. 30 tablet 5   Polyethyl Glycol-Propyl Glycol (LUBRICANT EYE DROPS) 0.4-0.3 % SOLN Place 1-2 drops into both eyes 3 (three) times daily as needed (dry/irritated eyes.).     sitaGLIPtin ([name]) 50 MG tablet Take 1 tablet (50 mg total) by mouth daily. 90 tablet 3    Results for orders placed or performed during the hospital encounter of 04/23/21 (from the past 48 hour(s))  Glucose, capillary     Status: Abnormal   Collection Time: 04/23/21  7:59 AM  Result Value Ref Range   Glucose-Capillary 122 (H) 70 - 99 mg/dL    Comment: Glucose reference range applies only to samples taken after fasting for  at least 8 hours.   No results found.  Review of Systems  Blood pressure (!) 174/71, pulse 65, temperature 97.8 F (36.6 C), temperature source Oral, resp. rate (!) 25, SpO2 96 %. Physical Exam HENT:     Mouth/Throat:     Mouth: Mucous membranes are moist.     Pharynx: Oropharynx is clear.  Eyes:     Conjunctiva/sclera: Conjunctivae normal.  Cardiovascular:     Rate and Rhythm: Normal rate and regular rhythm.     Heart sounds: Normal heart sounds. No murmur heard. Pulmonary:     Effort: Pulmonary effort is normal.     Breath sounds: Normal breath sounds.  Abdominal:     General: There is  no distension.     Palpations: Abdomen is soft. There is no mass.     Tenderness: There is no abdominal tenderness.  Musculoskeletal:        General: No swelling.     Cervical back: Neck supple.  Lymphadenopathy:     Cervical: No cervical adenopathy.  Skin:    General: Skin is warm.  Neurological:     Mental Status: She is alert.     Assessment/Plan  Esophageal stricture. Esophagogastroduodenoscopy with esophageal dilation under monitored anesthesia care.  Hildred Laser, MD 04/23/2021, 8:21 AM

## 2021-04-23 NOTE — Anesthesia Postprocedure Evaluation (Signed)
Anesthesia Post Note  Patient: Maria Alexander  Procedure(s) Performed: ESOPHAGOGASTRODUODENOSCOPY (EGD) WITH PROPOFOL BIOPSY  Patient location during evaluation: Phase II Anesthesia Type: General Level of consciousness: awake and alert and oriented Pain management: pain level controlled Vital Signs Assessment: post-procedure vital signs reviewed and stable Respiratory status: spontaneous breathing, nonlabored ventilation and respiratory function stable Cardiovascular status: blood pressure returned to baseline and stable Postop Assessment: no apparent nausea or vomiting Anesthetic complications: no   No notable events documented.   Last Vitals:  Vitals:   04/23/21 0753 04/23/21 0851  BP: (!) 174/71 (!) 118/49  Pulse: 65 69  Resp: (!) 25 18  Temp: 36.6 C 36.6 C  SpO2: 96% 97%    Last Pain:  Vitals:   04/23/21 0851  TempSrc: Oral  PainSc: 0-No pain                 Akram Kissick C Eden Toohey

## 2021-04-23 NOTE — Discharge Instructions (Addendum)
No aspirin or NSAIDs for 3 days. Resume usual medications and diet as before. No driving for 24 hours. Physician will call with biopsy results.

## 2021-04-23 NOTE — Anesthesia Preprocedure Evaluation (Signed)
Anesthesia Evaluation  Patient identified by MRN, date of birth, ID band Patient awake    Reviewed: Allergy & Precautions, NPO status , Patient's Chart, lab work & pertinent test results  Airway Mallampati: II  TM Distance: >3 FB Neck ROM: Full    Dental  (+) Partial Lower, Partial Upper   Pulmonary neg pulmonary ROS, former smoker,    Pulmonary exam normal breath sounds clear to auscultation       Cardiovascular Exercise Tolerance: Good hypertension, Pt. on medications + DVT   Rhythm:Regular Rate:Normal + Systolic murmurs    Neuro/Psych Anxiety CVA, No Residual Symptoms negative psych ROS   GI/Hepatic negative GI ROS, Neg liver ROS,   Endo/Other  diabetes, Well Controlled, Type 2, Oral Hypoglycemic AgentsHypothyroidism   Renal/GU negative Renal ROS  negative genitourinary   Musculoskeletal negative musculoskeletal ROS (+)   Abdominal   Peds negative pediatric ROS (+)  Hematology negative hematology ROS (+)   Anesthesia Other Findings   Reproductive/Obstetrics negative OB ROS                            Anesthesia Physical Anesthesia Plan  ASA: 3  Anesthesia Plan: General   Post-op Pain Management:    Induction: Intravenous  PONV Risk Score and Plan:   Airway Management Planned: Nasal Cannula and Natural Airway  Additional Equipment:   Intra-op Plan:   Post-operative Plan:   Informed Consent: I have reviewed the patients History and Physical, chart, labs and discussed the procedure including the risks, benefits and alternatives for the proposed anesthesia with the patient or authorized representative who has indicated his/her understanding and acceptance.     Dental advisory given  Plan Discussed with: CRNA and Surgeon  Anesthesia Plan Comments:         Anesthesia Quick Evaluation

## 2021-04-23 NOTE — Op Note (Signed)
[facility] Patient Name: Maria Alexander Procedure Date: 04/23/2021 8:03 AM MRN: [id number] Date of Birth: 01-23-1924 Attending MD: Hildred Laser , MD CSN: [id number] Age: 87 Admit Type: Outpatient Procedure:                Upper GI endoscopy Indications:              Stricture of the esophagus, For therapy of                            esophageal stricture Providers:                Hildred Laser, MD, Lurline Del, RN, Casimer Bilis, Technician Referring MD:             Koleen Distance. Gottschalk, DO as she has had polyps she                            had sessile serrated and Medicines:                Propofol per Anesthesia Complications:            No immediate complications. Estimated Blood Loss:     Estimated blood loss was minimal. Procedure:                Pre-Anesthesia Assessment:                           - Prior to the procedure, a History and Physical                            was performed, and patient medications and                            allergies were reviewed. The patient's tolerance of                            previous anesthesia was also reviewed. The risks                            and benefits of the procedure and the sedation                            options and risks were discussed with the patient.                            All questions were answered, and informed consent                            was obtained. Prior Anticoagulants: The patient has                            taken no previous anticoagulant or antiplatelet  agents. ASA Grade Assessment: III - A patient with                            severe systemic disease. After reviewing the risks                            and benefits, the patient was deemed in                            satisfactory condition to undergo the procedure.                           After obtaining informed consent, the endoscope was                             passed under direct vision. Throughout the                            procedure, the patient's blood pressure, pulse, and                            oxygen saturations were monitored continuously. The                            GIF-H190 ([id number]) scope was introduced through the                            mouth, and advanced to the second part of duodenum.                            The upper GI endoscopy was accomplished without                            difficulty. The patient tolerated the procedure                            well. Scope In: 8:35:27 AM Scope Out: 8:46:09 AM Total Procedure Duration: 0 hours 10 minutes 42 seconds  Findings:      The hypopharynx was normal.      The proximal esophagus was normal.      Diffuse mild mucosal changes characterized by altered texture and white       specks were found in the mid esophagus and in the distal esophagus.       Biopsies were taken with a cold forceps for histology. The pathology       specimen was placed into Bottle Number 1.      One benign-appearing, intrinsic moderate stenosis was found 32 to 34 cm       from the incisors. The stenosis was traversed. stricture was dilated       with balloon from 15 mm to 16.5 mm. The dilation site was examined and       showed moderate mucosal disruption, moderate improvement in luminal       narrowing and no perforation.      The Z-line was regular and was found 34 cm  from the incisors.      A 5 cm hiatal hernia was present.      A scar was found in the gastric antrum and in the prepyloric region of       the stomach.      The exam of the stomach was otherwise normal.      A mild post-ulcer deformity was found in the duodenal bulb.      The second portion of the duodenum was normal. Impression:               - Normal hypopharynx.                           - Normal proximal esophagus.                           - Texture changed, white specked mucosa in the                             esophagus. Biopsied.                           - Benign-appearing esophageal stenosis. Stricture                            length 2 cm. Dilated to 16.5 mm with a balloon.                           - Z-line regular, 34 cm from the incisors.                           - 5 cm hiatal hernia.                           - Scar in the gastric antrum and in the prepyloric                            region of the stomach.                           - Duodenal deformity.                           - Normal second portion of the duodenum. Moderate Sedation:      Per Anesthesia Care Recommendation:           - Patient has a contact number available for                            emergencies. The signs and symptoms of potential                            delayed complications were discussed with the                            patient. Return to normal activities tomorrow.  Written discharge instructions were provided to the                            patient.                           - Resume previous diet today.                           - Continue present medications.                           - No aspirin, ibuprofen, naproxen, or other                            non-steroidal anti-inflammatory drugs for 3 days.                           - Await pathology results.                           - Repeat upper endoscopy PRN. Procedure Code(s):        --- Professional ---                           [phone], Esophagogastroduodenoscopy, flexible,                            transoral; with biopsy, single or multiple Diagnosis Code(s):        --- Professional ---                           K22.8, Other specified diseases of esophagus                           K22.2, Esophageal obstruction                           K44.9, Diaphragmatic hernia without obstruction or                            gangrene                           K31.89, Other diseases of stomach and duodenum CPT copyright  2019 American Medical Association. All rights reserved. The codes documented in this report are preliminary and upon coder review may  be revised to meet current compliance requirements. Hildred Laser, MD Hildred Laser, MD 04/23/2021 8:58:31 AM This report has been signed electronically. Number of Addenda: 0

## 2021-04-23 NOTE — Transfer of Care (Signed)
Immediate Anesthesia Transfer of Care Note  Patient: Maria Alexander  Procedure(s) Performed: ESOPHAGOGASTRODUODENOSCOPY (EGD) WITH PROPOFOL BIOPSY  Patient Location: PACU  Anesthesia Type:General  Level of Consciousness: awake, alert , oriented and patient cooperative  Airway & Oxygen Therapy: Patient Spontanous Breathing  Post-op Assessment: Report given to RN, Post -op Vital signs reviewed and stable and Patient moving all extremities X 4  Post vital signs: Reviewed and stable  Last Vitals:  Vitals Value Taken Time  BP    Temp    Pulse    Resp    SpO2      Last Pain:  Vitals:   04/23/21 0753  TempSrc: Oral  PainSc: 1          Complications: No notable events documented.

## 2021-04-24 ENCOUNTER — Telehealth: Payer: Self-pay | Admitting: Family Medicine

## 2021-04-24 LAB — SURGICAL PATHOLOGY

## 2021-04-24 NOTE — Telephone Encounter (Signed)
Patient returning call for results. Contacted patient and instructed patient to contact PCP or Hildred Laser, MD for results from endoscopy. Patient verbalized understanding and will await call back from PCP or GI.

## 2021-04-24 NOTE — Telephone Encounter (Signed)
Pt is calling regarding her lab results. She states that she has missed 3 calls today from [facility]. Please advise CB- [phone]

## 2021-04-25 ENCOUNTER — Encounter (HOSPITAL_COMMUNITY): Payer: Self-pay | Admitting: Internal Medicine

## 2021-05-06 DIAGNOSIS — H353131 Nonexudative age-related macular degeneration, bilateral, early dry stage: Secondary | ICD-10-CM | POA: Diagnosis not present

## 2021-05-06 DIAGNOSIS — H04123 Dry eye syndrome of bilateral lacrimal glands: Secondary | ICD-10-CM | POA: Diagnosis not present

## 2021-05-06 DIAGNOSIS — Z961 Presence of intraocular lens: Secondary | ICD-10-CM | POA: Diagnosis not present

## 2021-05-06 DIAGNOSIS — E119 Type 2 diabetes mellitus without complications: Secondary | ICD-10-CM | POA: Diagnosis not present

## 2021-05-06 LAB — HM DIABETES EYE EXAM

## 2021-05-14 ENCOUNTER — Ambulatory Visit (INDEPENDENT_AMBULATORY_CARE_PROVIDER_SITE_OTHER): Payer: Medicare Other | Admitting: Family Medicine

## 2021-05-14 ENCOUNTER — Encounter: Payer: Self-pay | Admitting: Family Medicine

## 2021-05-14 ENCOUNTER — Other Ambulatory Visit: Payer: Self-pay

## 2021-05-14 VITALS — BP 148/69 | HR 68 | Temp 97.6°F | Ht 60.0 in | Wt 125.8 lb

## 2021-05-14 DIAGNOSIS — E785 Hyperlipidemia, unspecified: Secondary | ICD-10-CM | POA: Diagnosis not present

## 2021-05-14 DIAGNOSIS — F419 Anxiety disorder, unspecified: Secondary | ICD-10-CM | POA: Diagnosis not present

## 2021-05-14 DIAGNOSIS — E1159 Type 2 diabetes mellitus with other circulatory complications: Secondary | ICD-10-CM | POA: Diagnosis not present

## 2021-05-14 DIAGNOSIS — E039 Hypothyroidism, unspecified: Secondary | ICD-10-CM

## 2021-05-14 DIAGNOSIS — E1169 Type 2 diabetes mellitus with other specified complication: Secondary | ICD-10-CM | POA: Diagnosis not present

## 2021-05-14 DIAGNOSIS — I152 Hypertension secondary to endocrine disorders: Secondary | ICD-10-CM

## 2021-05-14 LAB — BAYER DCA HB A1C WAIVED: HB A1C (BAYER DCA - WAIVED): 6.5 % — ABNORMAL HIGH (ref 4.8–5.6)

## 2021-05-14 MED ORDER — ALPRAZOLAM 1 MG PO TABS: 0.5000 mg | ORAL_TABLET | Freq: Every evening | ORAL | 3 refills | Status: DC | PRN

## 2021-05-14 NOTE — Patient Instructions (Signed)
Ms Maria Alexander will call you for an appointment.  We'll see if we can get something similar to the [name] for you.
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
## 2021-05-15 ENCOUNTER — Telehealth: Payer: Self-pay

## 2021-05-15 LAB — CMP14+EGFR
ALT: 25 IU/L (ref 0–32)
AST: 26 IU/L (ref 0–40)
Albumin/Globulin Ratio: 2.6 — ABNORMAL HIGH (ref 1.2–2.2)
Albumin: 4.5 g/dL (ref 3.5–4.6)
Alkaline Phosphatase: 71 IU/L (ref 44–121)
BUN/Creatinine Ratio: 12 (ref 12–28)
BUN: 12 mg/dL (ref 10–36)
Bilirubin Total: 0.6 mg/dL (ref 0.0–1.2)
CO2: 25 mmol/L (ref 20–29)
Calcium: 9.4 mg/dL (ref 8.7–10.3)
Chloride: 101 mmol/L (ref 96–106)
Creatinine, Ser: 1.01 mg/dL — ABNORMAL HIGH (ref 0.57–1.00)
Globulin, Total: 1.7 g/dL (ref 1.5–4.5)
Glucose: 134 mg/dL — ABNORMAL HIGH (ref 70–99)
Potassium: 4.5 mmol/L (ref 3.5–5.2)
Sodium: 139 mmol/L (ref 134–144)
Total Protein: 6.2 g/dL (ref 6.0–8.5)
eGFR: 51 mL/min/{1.73_m2} — ABNORMAL LOW (ref >59)

## 2021-05-15 LAB — LIPID PANEL
Chol/HDL Ratio: 3.5 ratio (ref 0.0–4.4)
Cholesterol, Total: 159 mg/dL (ref 100–199)
HDL: 46 mg/dL (ref >39)
LDL Chol Calc (NIH): 90 mg/dL (ref 0–99)
Triglycerides: 131 mg/dL (ref 0–149)
VLDL Cholesterol Cal: 23 mg/dL (ref 5–40)

## 2021-05-15 LAB — TSH: TSH: 3.03 u[IU]/mL (ref 0.450–4.500)

## 2021-05-15 LAB — T4, FREE: Free T4: 1.36 ng/dL (ref 0.82–1.77)

## 2021-05-15 NOTE — Chronic Care Management (AMB) (Signed)
  Chronic Care Management   Note  05/15/2021 Name: Maria Alexander MRN: [id number] DOB: [birth year]  Maria Alexander is a 87 y.o. year old female who is a primary care patient of Janora Norlander, DO. I reached out to [name] by phone today in response to a referral sent by [name] PCP.  [name] was given information about Chronic Care Management services today including:  CCM service includes personalized support from designated clinical staff supervised by her physician, including individualized plan of care and coordination with other care providers 24/7 contact phone numbers for assistance for urgent and routine care needs. Service will only be billed when office clinical staff spend 20 minutes or more in a month to coordinate care. Only one practitioner may furnish and bill the service in a calendar month. The patient may stop CCM services at any time (effective at the end of the month) by phone call to the office staff. The patient is responsible for co-pay (up to 20% after annual deductible is met) if co-pay is required by the individual health plan.   Patient agreed to services and verbal consent obtained.   Follow up plan: Telephone appointment with care management team member scheduled for:06/18/2021  Noreene Larsson, [address], [place], [place] [address] Direct Dial: [phone] [email or web address] [email or web address]

## 2021-06-18 ENCOUNTER — Ambulatory Visit (INDEPENDENT_AMBULATORY_CARE_PROVIDER_SITE_OTHER): Payer: Medicare Other | Admitting: Pharmacist

## 2021-06-18 DIAGNOSIS — E785 Hyperlipidemia, unspecified: Secondary | ICD-10-CM

## 2021-06-18 DIAGNOSIS — E1169 Type 2 diabetes mellitus with other specified complication: Secondary | ICD-10-CM

## 2021-06-18 NOTE — Patient Instructions (Signed)
Visit Information  Thank you for taking time to visit with me today. Please don't hesitate to contact me if I can be of assistance to you before our next scheduled telephone appointment.  Following are the goals we discussed today:  Current Barriers:  Unable to independently afford treatment regimen Suboptimal therapeutic regimen for T2DM  Pharmacist Clinical Goal(s):  patient will verbalize ability to afford treatment regimen adhere to plan to optimize therapeutic regimen for T2DM as evidenced by report of adherence to recommended medication management changes through collaboration with PharmD and provider.    Interventions: 1:1 collaboration with Janora Norlander, DO regarding development and update of comprehensive plan of care as evidenced by provider attestation and co-signature Inter-disciplinary care team collaboration (see longitudinal plan of care) Comprehensive medication review performed; medication list updated in electronic medical record  Diabetes: Goal on Track (progressing): YES. controlled; current treatment:GLIPIZIDE, [name];  PATIENT STATES COST OF [name] IS >$150 WILL PROVIDE ONGLYZA 5MG  DAILY SAMPLE TO SEE HOW PATIENT DOES (CAN GET FREE VIA MANUFACTURER) PATIENT TO F/U IN 1 MONTH TO RECHECK BLOOD SUGARS WOULD LIKE TO D/C GLIPIZIDE GIVEN AGE AND RISK OF HYPOGLYCEMIA GFR 51 A1C 6.5% Current glucose readings: fasting glucose: N/A, post prandial glucose: N/A Denies hypoglycemic/hyperglycemic symptoms Current exercise: N/A; PATIENT STILL ACTIVE/DRIVES Recommended TRIAL OF ONGLYZA, POTENTIALLY D/C GLIPIZIDE DUE TO [place] RISK   Patient Goals/Self-Care Activities patient will:  - take medications as prescribed as evidenced by patient report and record review check glucose DAILY (FASTING), document, and provide at future appointments   Please call the care guide team at [phone] if you need to cancel or reschedule your appointment.   The patient  verbalized understanding of instructions, educational materials, and care plan provided today and declined offer to receive copy of patient instructions, educational materials, and care plan.   Signature Regina Eck, PharmD, BCPS Clinical Pharmacist, [place]  II Phone [phone]

## 2021-06-18 NOTE — Progress Notes (Signed)
Chronic Care Management Pharmacy Note  06/18/2021 Name:  Maria Alexander MRN:  [id number] DOB:  August 24, 1923  Summary: T2DM  Recommendations/Changes made from today's visit: Diabetes: Goal on Track (progressing): YES. controlled; current treatment:GLIPIZIDE, [name];  PATIENT STATES COST OF [name] IS >$150 WILL PROVIDE ONGLYZA 5MG DAILY SAMPLE TO SEE HOW PATIENT DOES (CAN GET FREE VIA MANUFACTURER) PATIENT TO F/U IN 1 MONTH TO RECHECK BLOOD SUGARS WOULD LIKE TO D/C GLIPIZIDE GIVEN AGE AND RISK OF HYPOGLYCEMIA GFR 51 A1C 6.5% Current glucose readings: fasting glucose: N/A, post prandial glucose: N/A Denies hypoglycemic/hyperglycemic symptoms Current exercise: N/A; PATIENT STILL ACTIVE/DRIVES Recommended TRIAL OF ONGLYZA, POTENTIALLY D/C GLIPIZIDE DUE TO [place] RISK   Patient Goals/Self-Care Activities patient will:  - take medications as prescribed as evidenced by patient report and record review check glucose DAILY (FASTING), document, and provide at future appointments   Plan: 07/29/21 AT 10:30AM  Subjective: Maria Alexander is an 87 y.o. year old female who is a primary patient of Janora Norlander, DO.  The CCM team was consulted for assistance with disease management and care coordination needs.    Engaged with patient by telephone for initial visit in response to provider referral for pharmacy case management and/or care coordination services.   Consent to Services:  The patient was given information about Chronic Care Management services, agreed to services, and gave verbal consent prior to initiation of services.  Please see initial visit note for detailed documentation.   Patient Care Team: Janora Norlander, DO as PCP - General (Family Medicine) Lavera Guise, [facility] as [place] Management (Pharmacist)  Objective:  Lab Results  Component Value Date   CREATININE 1.01 (H) 05/14/2021   CREATININE 0.88 03/17/2021   CREATININE 0.81  07/22/2020    Lab Results  Component Value Date   HGBA1C 6.5 (H) 05/14/2021   Last diabetic Eye exam:  Lab Results  Component Value Date/Time   HMDIABEYEEXA No Retinopathy 05/06/2021 12:00 AM    Last diabetic Foot exam: No results found for: HMDIABFOOTEX      Component Value Date/Time   CHOL 159 05/14/2021 1045   TRIG 131 05/14/2021 1045   HDL 46 05/14/2021 1045   CHOLHDL 3.5 05/14/2021 1045   LDLCALC 90 05/14/2021 1045    Hepatic Function Latest Ref Rng & Units 05/14/2021 07/22/2020 09/13/2019  Total Protein 6.0 - 8.5 g/dL 6.2 6.4 6.0  Albumin 3.5 - 4.6 g/dL 4.5 4.3 4.2  AST 0 - 40 IU/L 26 26 22   ALT 0 - 32 IU/L 25 19 19   Alk Phosphatase 44 - 121 IU/L 71 76 70  Total Bilirubin 0.0 - 1.2 mg/dL 0.6 0.3 0.6    Lab Results  Component Value Date/Time   TSH 3.030 05/14/2021 10:45 AM   TSH 2.760 07/22/2020 01:45 PM   FREET4 1.36 05/14/2021 10:45 AM    CBC Latest Ref Rng & Units 09/13/2019 12/09/2018 07/10/2018  WBC 3.4 - 10.8 x10E3/uL 5.7 5.5 -  Hemoglobin 11.1 - 15.9 g/dL 13.1 12.2 11.6(L)  Hematocrit 34.0 - 46.6 % 38.9 38.2 34.0(L)  Platelets 150 - 450 x10E3/uL 142(L) 157 -    No results found for: VD25OH  Clinical ASCVD: No  The ASCVD Risk score (Arnett DK, et al., 2019) failed to calculate for the following reasons:   The 2019 ASCVD risk score is only valid for ages 63 to 33    Other: (CHADS2VASc if Afib, PHQ9 if depression, MMRC or CAT for COPD, ACT, DEXA)  Social History   Tobacco Use  Smoking Status Former   Types: Cigarettes   Quit date: 02/05/1995   Years since quitting: 26.3  Smokeless Tobacco Never   BP Readings from Last 3 Encounters:  05/14/21 (!) 148/69  04/23/21 (!) 118/49  03/19/21 (!) 130/58   Pulse Readings from Last 3 Encounters:  05/14/21 68  04/23/21 69  03/19/21 62   Wt Readings from Last 3 Encounters:  05/14/21 125 lb 12.8 oz (57.1 kg)  03/17/21 129 lb (58.5 kg)  03/04/21 129 lb 1.6 oz (58.6 kg)    Assessment: Review of patient  past medical history, allergies, medications, health status, including review of consultants reports, laboratory and other test data, was performed as part of comprehensive evaluation and provision of chronic care management services.   SDOH:  (Social Determinants of Health) assessments and interventions performed:    CCM Care Plan  Allergies  Allergen Reactions   Shellfish Allergy Hives    Medications Reviewed Today     Reviewed by Lavera Guise, [facility] (Pharmacist) on 06/18/21 at 1326  Med List Status: <None>   Medication Order Taking? Sig Documenting Provider Last Dose Status Informant  ALPRAZolam (XANAX) 1 MG tablet [id number]  Take 0.5-1 tablets (0.5-1 mg total) by mouth at bedtime as needed for anxiety (place on file.). Ronnie Doss M, DO  Active   atorvastatin (LIPITOR) 80 MG tablet [id number]  Take 1/2 tablet (40 mg total) by mouth daily. Ronnie Doss M, DO  Active Self  glipiZIDE (GLUCOTROL XL) 5 MG 24 hr tablet [id number]  Take 2 tablets (10 mg total) by mouth daily with breakfast. Janora Norlander, DO  Active Self  levothyroxine (SYNTHROID) 50 MCG tablet [id number]  Take 1 tablet (50 mcg total) by mouth daily before breakfast. Ronnie Doss M, DO  Active Self  losartan (COZAAR) 100 MG tablet [id number]  Take 1 tablet (100 mg total) by mouth daily. Ronnie Doss M, DO  Active Self  Multiple Vitamin (MULTIVITAMIN) tablet [id number]  Take 1 tablet by mouth daily. Centrum Women's 50+ [provider]  Active Self  pantoprazole (PROTONIX) 40 MG tablet [id number]  Take 1 tablet (40 mg total) by mouth daily before breakfast. Rehman, Mechele Dawley, MD  Active Self  Polyethyl Glycol-Propyl Glycol (LUBRICANT EYE DROPS) 0.4-0.3 % SOLN [id number]  Place 1-2 drops into both eyes 3 (three) times daily as needed (dry/irritated eyes.). [provider]  Active Self    Discontinued 06/18/21 1326 (Change in therapy)           Patient Active Problem List   Diagnosis  Date Noted   Esophageal dysphagia 03/04/2021   History of esophageal stricture 03/04/2021   Hyperlipidemia associated with type 2 diabetes mellitus ([place]) 12/15/2019   Hypertension associated with diabetes ([place]) 12/09/2018   Anxiety 11/29/2017   Chronic hoarseness 01/15/2017   Hypothyroidism 06/06/2014   Diabetes ([place]) 06/06/2014    Immunization History  Administered Date(s) Administered   Fluad Quad(high Dose 65+) 04/01/2020   Influenza, High Dose Seasonal PF 04/04/2018   Influenza,inj,Quad PF,6+ Mos 03/31/2017   Influenza-Unspecified 04/07/2016, 04/30/2017, 04/04/2018, 02/28/2019   Moderna Sars-Covid-2 Vaccination 07/11/2019, 08/11/2019, 04/24/2020   Pneumococcal Conjugate-13 08/22/2014   Pneumococcal Polysaccharide-23 04/11/1997   Zoster Recombinat (Shingrix) 03/22/2017, 06/02/2017    Conditions to be addressed/monitored: DMII and CKD Stage 3A  Care Plan : PHARMD MEDICATION MANAGEMENT  Updates made by Lavera Guise, [place] since 06/18/2021 12:00 AM     Problem: DISEASE PROGRESSION PREVENTION  Long-Range Goal: T2DM   This Visit's Progress: On track  Priority: High  Note:   Current Barriers:  Unable to independently afford treatment regimen Suboptimal therapeutic regimen for T2DM  Pharmacist Clinical Goal(s):  patient will verbalize ability to afford treatment regimen adhere to plan to optimize therapeutic regimen for T2DM as evidenced by report of adherence to recommended medication management changes through collaboration with PharmD and provider.    Interventions: 1:1 collaboration with Janora Norlander, DO regarding development and update of comprehensive plan of care as evidenced by provider attestation and co-signature Inter-disciplinary care team collaboration (see longitudinal plan of care) Comprehensive medication review performed; medication list updated in electronic medical record  Diabetes: Goal on Track (progressing): YES. controlled; current  treatment:GLIPIZIDE, [name];  PATIENT STATES COST OF [name] IS >$150 WILL PROVIDE ONGLYZA 5MG DAILY SAMPLE TO SEE HOW PATIENT DOES (CAN GET FREE VIA MANUFACTURER) PATIENT TO F/U IN 1 MONTH TO RECHECK BLOOD SUGARS WOULD LIKE TO D/C GLIPIZIDE GIVEN AGE AND RISK OF HYPOGLYCEMIA GFR 51 A1C 6.5% Current glucose readings: fasting glucose: N/A, post prandial glucose: N/A Denies hypoglycemic/hyperglycemic symptoms Current exercise: N/A; PATIENT STILL ACTIVE/DRIVES Recommended TRIAL OF ONGLYZA, POTENTIALLY D/C GLIPIZIDE DUE TO [place] RISK   Patient Goals/Self-Care Activities patient will:  - take medications as prescribed as evidenced by patient report and record review check glucose DAILY (FASTING), document, and provide at future appointments      Medication Assistance: None required.  Patient affirms current coverage meets needs.  Patient's preferred pharmacy is:  [facility], [place] [address] Phone: [phone] Fax: [phone]   Follow Up:  Patient agrees to Care Plan and Follow-up.  Plan: Face to Face appointment with care management team member scheduled for: 07/29/21   Regina Eck, PharmD, BCPS Clinical Pharmacist, [place]  II Phone [phone]

## 2021-06-28 DIAGNOSIS — E785 Hyperlipidemia, unspecified: Secondary | ICD-10-CM

## 2021-06-28 DIAGNOSIS — E1169 Type 2 diabetes mellitus with other specified complication: Secondary | ICD-10-CM

## 2021-07-21 ENCOUNTER — Other Ambulatory Visit: Payer: Self-pay | Admitting: Family Medicine

## 2021-07-21 DIAGNOSIS — E039 Hypothyroidism, unspecified: Secondary | ICD-10-CM

## 2021-07-29 ENCOUNTER — Ambulatory Visit (INDEPENDENT_AMBULATORY_CARE_PROVIDER_SITE_OTHER): Payer: Medicare Other | Admitting: Pharmacist

## 2021-07-29 DIAGNOSIS — E785 Hyperlipidemia, unspecified: Secondary | ICD-10-CM

## 2021-07-29 DIAGNOSIS — E1169 Type 2 diabetes mellitus with other specified complication: Secondary | ICD-10-CM | POA: Diagnosis not present

## 2021-07-29 NOTE — Progress Notes (Signed)
Chronic Care Management Pharmacy Note  07/29/2021 Name:  Maria Alexander MRN:  [id number] DOB:  [birth year]  Summary: T2DM, HLD  Recommendations/Changes made from today's visit:  Diabetes: Goal on Track (progressing): YES. Controlled-A1C 6.5%; current treatment: GLIPIZIDE 10MG W/ BREAKFAST, [name]-->ONGLYZA 5MG;  PATIENT STATES COST OF [name] IS >$150/MONTH (ENDS UP IN COVERAGE GAP) [name] REPLACED WITH ONGLYZA 5MG DAILY SAMPLES PATIENT TOLERATING WELL APPLICATION SENT TO [employer] PATIENT ASSISTANCE PROGRAM FOR FREE ONGLYZA ESCRIBE TO [place] FOR PATIENT ASSISTANCE WILL SHIP TO PATIENT'S HOME IN 3 MONTH INCREMENTS IF APPROVED WILL [place] A1C TO SEE PROGRESS AT NEXT VISIT PLAN TO D/C GLIPIZIDE GIVEN AGE AND RISK OF HYPOGLYCEMIA GFR 51 Current glucose readings: fasting glucose: N/A, post prandial glucose: N/A Denies hypoglycemic/hyperglycemic symptoms Current exercise: N/A; PATIENT STILL ACTIVE/DRIVES Recommended TRIAL OF ONGLYZA, POTENTIALLY D/C GLIPIZIDE DUE TO HYPOGYLCEMIA RISK DISCUSSED CHOLESTEROL AND LIPID PANEL CONTINUE ATORVASTATIN 20MG DAILY FOR NOW GOAL<70,CONTINUE TO ADDRESS Lipid Panel     Component Value Date/Time   CHOL 159 05/14/2021 1045   TRIG 131 05/14/2021 1045   HDL 46 05/14/2021 1045   CHOLHDL 3.5 05/14/2021 1045   [place] 90 05/14/2021 1045   LABVLDL 23 05/14/2021 1045  Patient Goals/Self-Care Activities patient will:  - take medications as prescribed as evidenced by patient report and record review check glucose DAILY (FASTING), document, and provide at future appointments  Plan: F/U IN 1 MONTH  Subjective: Maria Alexander is an 88 y.o. year old female who is a primary patient of Janora Norlander, DO.  The CCM team was consulted for assistance with disease management and care coordination needs.    Engaged with patient face to face for follow up visit in response to provider referral for pharmacy case management and/or care coordination services.    Consent to Services:  The patient was given information about Chronic Care Management services, agreed to services, and gave verbal consent prior to initiation of services.  Please see initial visit note for detailed documentation.   Patient Care Team: Janora Norlander, DO as PCP - General (Family Medicine) Lavera Guise, [facility] as Pharmacist (Family Medicine)  Objective:  Lab Results  Component Value Date   CREATININE 1.01 (H) 05/14/2021   CREATININE 0.88 03/17/2021   CREATININE 0.81 07/22/2020    Lab Results  Component Value Date   HGBA1C 6.5 (H) 05/14/2021   Last diabetic Eye exam:  Lab Results  Component Value Date/Time   HMDIABEYEEXA No Retinopathy 05/06/2021 12:00 AM    Last diabetic Foot exam: No results found for: HMDIABFOOTEX      Component Value Date/Time   CHOL 159 05/14/2021 1045   TRIG 131 05/14/2021 1045   HDL 46 05/14/2021 1045   CHOLHDL 3.5 05/14/2021 1045   LDLCALC 90 05/14/2021 1045    Hepatic Function Latest Ref Rng & Units 05/14/2021 07/22/2020 09/13/2019  Total Protein 6.0 - 8.5 g/dL 6.2 6.4 6.0  Albumin 3.5 - 4.6 g/dL 4.5 4.3 4.2  AST 0 - 40 IU/L 26 26 22   ALT 0 - 32 IU/L 25 19 19   Alk Phosphatase 44 - 121 IU/L 71 76 70  Total Bilirubin 0.0 - 1.2 mg/dL 0.6 0.3 0.6    Lab Results  Component Value Date/Time   TSH 3.030 05/14/2021 10:45 AM   TSH 2.760 07/22/2020 01:45 PM   FREET4 1.36 05/14/2021 10:45 AM    CBC Latest Ref Rng & Units 09/13/2019 12/09/2018 07/10/2018  WBC 3.4 - 10.8 x10E3/uL 5.7 5.5 -  Hemoglobin 11.1 - 15.9 g/dL 13.1 12.2 11.6(L)  Hematocrit 34.0 - 46.6 % 38.9 38.2 34.0(L)  Platelets 150 - 450 x10E3/uL 142(L) 157 -    No results found for: VD25OH  Clinical ASCVD: No  The ASCVD Risk score (Arnett DK, et al., 2019) failed to calculate for the following reasons:   The 2019 ASCVD risk score is only valid for ages 86 to 62    Other: (CHADS2VASc if Afib, PHQ9 if depression, MMRC or CAT for COPD, ACT, DEXA)  Social  History   Tobacco Use  Smoking Status Former   Types: Cigarettes   Quit date: 02/05/1995   Years since quitting: 26.5  Smokeless Tobacco Never   BP Readings from Last 3 Encounters:  05/14/21 (!) 148/69  04/23/21 (!) 118/49  03/19/21 (!) 130/58   Pulse Readings from Last 3 Encounters:  05/14/21 68  04/23/21 69  03/19/21 62   Wt Readings from Last 3 Encounters:  05/14/21 125 lb 12.8 oz (57.1 kg)  03/17/21 129 lb (58.5 kg)  03/04/21 129 lb 1.6 oz (58.6 kg)    Assessment: Review of patient past medical history, allergies, medications, health status, including review of consultants reports, laboratory and other test data, was performed as part of comprehensive evaluation and provision of chronic care management services.   SDOH:  (Social Determinants of Health) assessments and interventions performed:    CCM Care Plan  Allergies  Allergen Reactions   Shellfish Allergy Hives    Medications Reviewed Today     Reviewed by Lavera Guise, [facility] (Pharmacist) on 08/03/21 at 2115  Med List Status: <None>   Medication Order Taking? Sig Documenting Provider Last Dose Status Informant  ALPRAZolam (XANAX) 1 MG tablet [id number]  Take 0.5-1 tablets (0.5-1 mg total) by mouth at bedtime as needed for anxiety (place on file.). Ronnie Doss M, DO  Active   atorvastatin (LIPITOR) 80 MG tablet [id number] No Take 1/2 tablet (40 mg total) by mouth daily. Ronnie Doss M, DO Taking Active Self  glipiZIDE (GLUCOTROL XL) 5 MG 24 hr tablet [id number] No Take 2 tablets (10 mg total) by mouth daily with breakfast. Janora Norlander, DO Taking Active Self  levothyroxine (SYNTHROID) 50 MCG tablet [id number]  TAKE (1) TABLET DAILY BE- FORE BREAKFAST. Ronnie Doss M, DO  Active   losartan (COZAAR) 100 MG tablet [id number] No Take 1 tablet (100 mg total) by mouth daily. Ronnie Doss M, DO Taking Active Self  Multiple Vitamin (MULTIVITAMIN) tablet [id number] No Take 1 tablet by mouth daily.  Centrum Women's 50+ [provider] Taking Active Self  pantoprazole (PROTONIX) 40 MG tablet [id number] No Take 1 tablet (40 mg total) by mouth daily before breakfast. Rehman, Mechele Dawley, MD Taking Active Self  Polyethyl Glycol-Propyl Glycol (LUBRICANT EYE DROPS) 0.4-0.3 % SOLN [id number] No Place 1-2 drops into both eyes 3 (three) times daily as needed (dry/irritated eyes.). [provider] Taking Active Self  saxagliptin HCl (ONGLYZA) 5 MG TABS tablet [id number]  Take 5 mg by mouth daily. [provider]  Active            Med Note Leisa Lenz Jun 18, 2021  1:26 PM) Samples given            Patient Active Problem List   Diagnosis Date Noted   Esophageal dysphagia 03/04/2021   History of esophageal stricture 03/04/2021   Hyperlipidemia associated with type 2 diabetes mellitus ([place]) 12/15/2019   Hypertension associated with diabetes ([place]) 12/09/2018  Anxiety 11/29/2017   Chronic hoarseness 01/15/2017   Hypothyroidism 06/06/2014   Diabetes ([place]) 06/06/2014    Immunization History  Administered Date(s) Administered   Fluad Quad(high Dose 65+) 04/01/2020   Influenza, High Dose Seasonal PF 04/04/2018   Influenza,inj,Quad PF,6+ Mos 03/31/2017   Influenza-Unspecified 04/07/2016, 04/30/2017, 04/04/2018, 02/28/2019   Moderna Sars-Covid-2 Vaccination 07/11/2019, 08/11/2019, 04/24/2020   Pneumococcal Conjugate-13 08/22/2014   Pneumococcal Polysaccharide-23 04/11/1997   Zoster Recombinat (Shingrix) 03/22/2017, 06/02/2017    Conditions to be addressed/monitored: HLD and DMII  Care Plan : PHARMD MEDICATION MANAGEMENT  Updates made by Lavera Guise, RPH since 08/03/2021 12:00 AM     Problem: DISEASE PROGRESSION PREVENTION      Long-Range Goal: T2DM PHARMD GOAL   Recent Progress: On track  Priority: High  Note:   Current Barriers:  Unable to independently afford treatment regimen Suboptimal therapeutic regimen for T2DM  Pharmacist Clinical  Goal(s):  patient will verbalize ability to afford treatment regimen adhere to plan to optimize therapeutic regimen for T2DM as evidenced by report of adherence to recommended medication management changes through collaboration with PharmD and provider.   Interventions: 1:1 collaboration with Janora Norlander, DO regarding development and update of comprehensive plan of care as evidenced by provider attestation and co-signature Inter-disciplinary care team collaboration (see longitudinal plan of care) Comprehensive medication review performed; medication list updated in electronic medical record  Diabetes: Goal on Track (progressing): YES. Controlled-A1C 6.5%; current treatment: GLIPIZIDE 10MG W/ BREAKFAST, [name]-->ONGLYZA 5MG;  PATIENT STATES COST OF [name] IS >$150/MONTH (ENDS UP IN COVERAGE GAP) [name] REPLACED WITH ONGLYZA 5MG DAILY SAMPLES PATIENT TOLERATING WELL APPLICATION SENT TO [employer] PATIENT ASSISTANCE PROGRAM FOR FREE ONGLYZA ESCRIBE TO [place] FOR PATIENT ASSISTANCE WILL SHIP TO PATIENT'S HOME IN 3 MONTH INCREMENTS IF APPROVED WILL [place] A1C TO SEE PROGRESS AT NEXT VISIT PLAN TO D/C GLIPIZIDE GIVEN AGE AND RISK OF HYPOGLYCEMIA GFR 51 Current glucose readings: fasting glucose: N/A, post prandial glucose: N/A Denies hypoglycemic/hyperglycemic symptoms Current exercise: N/A; PATIENT STILL ACTIVE/DRIVES Recommended TRIAL OF ONGLYZA, POTENTIALLY D/C GLIPIZIDE DUE TO HYPOGYLCEMIA RISK DISCUSSED CHOLESTEROL AND LIPID PANEL CONTINUE ATORVASTATIN 20MG DAILY FOR NOW GOAL<70,CONTINUE TO ADDRESS Lipid Panel     Component Value Date/Time   CHOL 159 05/14/2021 1045   TRIG 131 05/14/2021 1045   HDL 46 05/14/2021 1045   CHOLHDL 3.5 05/14/2021 1045   [place] 90 05/14/2021 1045   LABVLDL 23 05/14/2021 1045    Patient Goals/Self-Care Activities patient will:  - take medications as prescribed as evidenced by patient report and record review check glucose DAILY (FASTING),  document, and provide at future appointments      Medication Assistance: Application for [place]/ONGLY[place]  medication assistance program. in process.  Anticipated assistance start date TBD.  See plan of care for additional detail.  Patient's preferred pharmacy is:  [facility] [address] [address] Phone: [phone] Fax: [phone]  Follow Up:  Patient agrees to Care Plan and Follow-up.  Plan: Telephone follow up appointment with care management team member scheduled for:  1 MONTH   Regina Eck, PharmD, BCPS Clinical Pharmacist, [place]  II Phone [phone]

## 2021-08-03 NOTE — Patient Instructions (Signed)
Visit Information  Following are the goals we discussed today:  Current Barriers:  Unable to independently afford treatment regimen Suboptimal therapeutic regimen for T2DM  Pharmacist Clinical Goal(s):  patient will verbalize ability to afford treatment regimen adhere to plan to optimize therapeutic regimen for T2DM as evidenced by report of adherence to recommended medication management changes through collaboration with PharmD and provider.   Interventions: 1:1 collaboration with Janora Norlander, DO regarding development and update of comprehensive plan of care as evidenced by provider attestation and co-signature Inter-disciplinary care team collaboration (see longitudinal plan of care) Comprehensive medication review performed; medication list updated in electronic medical record  Diabetes: Goal on Track (progressing): YES. Controlled-A1C 6.5%; current treatment: GLIPIZIDE 10MG  W/ BREAKFAST, [name]-->ONGLYZA 5MG ;  PATIENT STATES COST OF [name] IS >$150/MONTH (ENDS UP IN COVERAGE GAP) [name] REPLACED WITH ONGLYZA 5MG  DAILY SAMPLES PATIENT TOLERATING WELL APPLICATION SENT TO [employer] PATIENT ASSISTANCE PROGRAM FOR FREE ONGLYZA ESCRIBE TO [place] FOR PATIENT ASSISTANCE WILL SHIP TO PATIENT'S HOME IN 3 MONTH INCREMENTS IF APPROVED WILL [place] A1C TO SEE PROGRESS AT NEXT VISIT PLAN TO D/C GLIPIZIDE GIVEN AGE AND RISK OF HYPOGLYCEMIA GFR 51 Current glucose readings: fasting glucose: N/A, post prandial glucose: N/A Denies hypoglycemic/hyperglycemic symptoms Current exercise: N/A; PATIENT STILL ACTIVE/DRIVES Recommended TRIAL OF ONGLYZA, POTENTIALLY D/C GLIPIZIDE DUE TO HYPOGYLCEMIA RISK DISCUSSED CHOLESTEROL AND LIPID PANEL CONTINUE ATORVASTATIN 20MG  DAILY FOR NOW GOAL<70,CONTINUE TO ADDRESS Lipid Panel     Component Value Date/Time   CHOL 159 05/14/2021 1045   TRIG 131 05/14/2021 1045   HDL 46 05/14/2021 1045   CHOLHDL 3.5 05/14/2021 1045   LDLCALC 90 05/14/2021 1045    LABVLDL 23 05/14/2021 1045     Patient Goals/Self-Care Activities patient will:  - take medications as prescribed as evidenced by patient report and record review check glucose DAILY (FASTING), document, and provide at future appointments   Plan: Telephone follow up appointment with care management team member scheduled for:  1 month  Signature Regina Eck, PharmD, BCPS Clinical Pharmacist, [place]  II Phone [phone]  Please call the care guide team at [phone] if you need to cancel or reschedule your appointment.   Print copy of patient instructions, educational materials, and care plan provided in person.

## 2021-08-05 ENCOUNTER — Ambulatory Visit (INDEPENDENT_AMBULATORY_CARE_PROVIDER_SITE_OTHER): Payer: Medicare Other

## 2021-08-05 VITALS — Wt 125.0 lb

## 2021-08-05 DIAGNOSIS — Z0000 Encounter for general adult medical examination without abnormal findings: Secondary | ICD-10-CM

## 2021-08-05 NOTE — Progress Notes (Signed)
Subjective:   Maria Alexander is a 88 y.o. female who presents for Medicare Annual (Subsequent) preventive examination.  Virtual Visit via Telephone Note  I connected with  Maria Alexander on 08/05/21 at 11:15 AM EST by telephone and verified that I am speaking with the correct person using two identifiers.  Location: Patient: Home Provider: [facility] Persons participating in the virtual visit: patient/Nurse Health Advisor   I discussed the limitations, risks, security and privacy concerns of performing an evaluation and management service by telephone and the availability of in person appointments. The patient expressed understanding and agreed to proceed.  Interactive audio and video telecommunications were attempted between this nurse and patient, however failed, due to patient having technical difficulties OR patient did not have access to video capability.  We continued and completed visit with audio only.  Some vital signs may be absent or patient reported.   Arkin Imran E Sandy Blouch, LPN   Review of Systems     Cardiac Risk Factors include: advanced age (>70men, >70 women);diabetes mellitus;dyslipidemia;hypertension     Objective:    Today's Vitals   08/05/21 1120  Weight: 125 lb (56.7 kg)   Body mass index is 24.41 kg/m.  Advanced Directives 08/05/2021 04/23/2021 03/19/2021 03/17/2021 08/02/2020 07/26/2019 05/02/2019  Does Patient Have a Medical Advance Directive? No - No No No No No  Type of Advance Directive - [place]  Does patient want to make changes to medical advance directive? - - - - - - -  Copy of [place] in Chart? - No - copy requested - - - - -  Would patient like information on creating a medical advance directive? No - Patient declined - No - Patient declined No - Patient declined No - Patient declined No - Patient declined -    Current Medications (verified) Outpatient Encounter Medications as of 08/05/2021  Medication Sig    ALPRAZolam (XANAX) 1 MG tablet Take 0.5-1 tablets (0.5-1 mg total) by mouth at bedtime as needed for anxiety (place on file.).   atorvastatin (LIPITOR) 80 MG tablet Take 1/2 tablet (40 mg total) by mouth daily.   glipiZIDE (GLUCOTROL XL) 5 MG 24 hr tablet Take 2 tablets (10 mg total) by mouth daily with breakfast.   levothyroxine (SYNTHROID) 50 MCG tablet TAKE (1) TABLET DAILY BE- FORE BREAKFAST.   losartan (COZAAR) 100 MG tablet Take 1 tablet (100 mg total) by mouth daily.   Multiple Vitamin (MULTIVITAMIN) tablet Take 1 tablet by mouth daily. Centrum Women's 50+   pantoprazole (PROTONIX) 40 MG tablet Take 1 tablet (40 mg total) by mouth daily before breakfast.   Polyethyl Glycol-Propyl Glycol (LUBRICANT EYE DROPS) 0.4-0.3 % SOLN Place 1-2 drops into both eyes 3 (three) times daily as needed (dry/irritated eyes.).   saxagliptin HCl (ONGLYZA) 5 MG TABS tablet Take 5 mg by mouth daily.   No facility-administered encounter medications on file as of 08/05/2021.    Allergies (verified) Shellfish allergy   History: Past Medical History:  Diagnosis Date   Diabetes ([place])    DVT (deep venous thrombosis) ([place])    H/O blood clots 2000   High cholesterol    Hypothyroid    Stroke [facility])    Past Surgical History:  Procedure Laterality Date   BIOPSY  04/23/2021   Procedure: BIOPSY;  Surgeon: Rogene Houston, MD;  [facility];  Service: Endoscopy;;  esophageal   ESOPHAGEAL DILATION N/A 03/19/2021   Procedure:  ESOPHAGEAL DILATION;  Surgeon: Rogene Houston, MD;  [facility];  Service: Endoscopy;  Laterality: N/A;   ESOPHAGOGASTRODUODENOSCOPY (EGD) WITH PROPOFOL N/A 03/19/2021   Procedure: ESOPHAGOGASTRODUODENOSCOPY (EGD) WITH PROPOFOL;  Surgeon: Rogene Houston, MD;  [facility];  Service: Endoscopy;  Laterality: N/A;  9:35   ESOPHAGOGASTRODUODENOSCOPY (EGD) WITH PROPOFOL N/A 04/23/2021   Procedure: ESOPHAGOGASTRODUODENOSCOPY (EGD) WITH PROPOFOL;  Surgeon:  Rogene Houston, MD;  [facility];  Service: Endoscopy;  Laterality: N/A;  8:30   NO PAST SURGERIES     Family History  Problem Relation Age of Onset   Cancer Mother    Heart disease Father        before age 73   Heart attack Father    Diabetes Sister    Diabetes Brother    Social History   Socioeconomic History   Marital status: Widowed    Spouse name: Not on file   Number of children: 3   Years of education: 10   Highest education level: 10th grade  Occupational History   Occupation: retired  Tobacco Use   Smoking status: Former    Types: Cigarettes    Quit date: 02/05/1995    Years since quitting: 26.5   Smokeless tobacco: Never  Vaping Use   Vaping Use: Never used  Substance and Sexual Activity   Alcohol use: No    Alcohol/week: 0.0 standard drinks   Drug use: No   Sexual activity: Not Currently    Birth control/protection: None  Other Topics Concern   Not on file  Social History Narrative   Lives alone- has a basement, but doesn't use it   Daughter lives nearby   Social Determinants of Health   Financial Resource Strain: Low Risk    Difficulty of Paying Living Expenses: Not hard at all  Food Insecurity: No Food Insecurity   Worried About Charity fundraiser in the Last Year: Never true   Arboriculturist in the Last Year: Never true  Transportation Needs: No Transportation Needs   Lack of Transportation (Medical): No   Lack of Transportation (Non-Medical): No  Physical Activity: Sufficiently Active   Days of Exercise per Week: 3 days   Minutes of Exercise per Session: 60 min  Stress: No Stress Concern Present   Feeling of Stress : Not at all  Social Connections: Moderately Integrated   Frequency of Communication with Friends and Family: More than three times a week   Frequency of Social Gatherings with Friends and Family: More than three times a week   Attends Religious Services: More than 4 times per year   Active Member of [employer] or  Organizations: Yes   Attends Archivist Meetings: More than 4 times per year   Marital Status: Widowed    Tobacco Counseling Counseling given: Not Answered   Clinical Intake:  Pre-visit preparation completed: Yes  Pain : No/denies pain     BMI - recorded: 24.41 Nutritional Status: BMI of 19-24  Normal Nutritional Risks: None Diabetes: Yes CBG done?: No Did pt. bring in CBG monitor from home?: No  How often do you need to have someone help you when you read instructions, pamphlets, or other written materials from your doctor or pharmacy?: 1 - Never  Diabetic? Nutrition Risk Assessment:  Has the patient had any N/V/D within the last 2 months?  No  Does the patient have any non-healing wounds?  No  Has the patient had any unintentional  weight loss or weight gain?  No   Diabetes:  Is the patient diabetic?  Yes  If diabetic, was a CBG obtained today?  No  Did the patient bring in their glucometer from home?  No  How often do you monitor your CBG's? never  Financial Strains and Diabetes Management:  Are you having any financial strains with the device, your supplies or your medication? No .  Does the patient want to be seen by Chronic Care Management for management of their diabetes?  No  Would the patient like to be referred to a Nutritionist or for Diabetic Management?  No   Diabetic Exams:  Diabetic Eye Exam: Completed 05/06/2021.   Diabetic Foot Exam: Completed 12/09/2018. Pt has been advised about the importance in completing this exam. Pt is scheduled for diabetic foot exam on next visit.    Interpreter Needed?: No  Information entered by :: Lesli Issa, LPN   Activities of Daily Living In your present state of health, do you have any difficulty performing the following activities: 08/05/2021 03/17/2021  Hearing? N N  Vision? N N  Difficulty concentrating or making decisions? N N  Walking or climbing stairs? Y N  Dressing or bathing? N N  Doing  errands, shopping? Y N  Comment only drives locally -her daughter drives her out of town prn -  Conservation officer, nature and eating ? N -  Using the Toilet? N -  In the past six months, have you accidently leaked urine? N -  Do you have problems with loss of bowel control? N -  Managing your Medications? N -  Managing your Finances? N -  Housekeeping or managing your Housekeeping? Y -  Comment a little, daughter helps -  Some recent data might be hidden    Patient Care Team: Janora Norlander, DO as PCP - General (Family Medicine) Lavera Guise, [facility] as Pharmacist (Family Medicine)  Indicate any recent Medical Services you may have received from other than [facility] providers in the past year (date may be approximate).     Assessment:   This is a routine wellness examination for Maria Alexander.  Hearing/Vision screen Hearing Screening - Comments:: Denies hearing difficulties   Vision Screening - Comments:: Wears rx glasses - up to date with routine eye exams with [facility]  Dietary issues and exercise activities discussed: Current Exercise Habits: Structured exercise class, Type of exercise: calisthenics;stretching;walking;Other - see comments (chair exercise class), Time (Minutes): 60, Frequency (Times/Week): 3, Weekly Exercise (Minutes/Week): 180, Intensity: Mild, Exercise limited by: orthopedic condition(s)   Goals Addressed             This Visit's Progress    DIET - INCREASE WATER INTAKE   On track    Try to drink 6-8 glasses of water daily     Eat more fruits and vegetables   On track    Increase fruits and vegetable servings to at least 1 serving of each per day.     Exercise 150 min/wk Moderate Activity   On track    Need for increased activity level in patient with diabetes, hypertension, and hyperlipidemia.   Current Barriers:  Knowledge Deficits related to how to safely increase activity level  Nurse Case Manager Clinical Goal(s):  Over the next 30 days, patient will slowly  increase activity level as tolerated with a goal of 150 min of moderate activity a week Over the next 90 days, patient will work with Consulting civil engineer to develop an exercise plan  Interventions:  Provided education to patient re: increasing physical activity level Discussed plans with patient for ongoing care management follow up and provided patient with direct contact information for care management team RN will mail printed resources for review  Patient Self Care Activities:  Performs ADL's independently Performs IADL's independently  Initial goal documentation        Depression Screen PHQ 2/9 Scores 08/05/2021 05/14/2021 11/15/2020 08/02/2020 07/22/2020 12/15/2019 09/13/2019  PHQ - 2 Score 0 0 0 0 0 0 0  PHQ- 9 Score - - - - 0 0 -    Fall Risk Fall Risk  08/05/2021 05/14/2021 11/15/2020 08/02/2020 07/22/2020  Falls in the past year? 0 0 0 0 0  Number falls in past yr: 0 - - - -  Injury with Fall? 0 - - - -  Risk for fall due to : Impaired balance/gait;Medication side effect;Orthopedic patient - - - -  Follow up Education provided;Falls prevention discussed - - Falls evaluation completed -    FALL RISK PREVENTION PERTAINING TO THE HOME:  Any stairs in or around the home? Yes  If so, are there any without handrails? No  Home free of loose throw rugs in walkways, pet beds, electrical cords, etc? Yes  Adequate lighting in your home to reduce risk of falls? Yes   ASSISTIVE DEVICES UTILIZED TO PREVENT FALLS:  Life alert? No  Use of a cane, walker or w/c? No  Grab bars in the bathroom? Yes  Shower chair or bench in shower? Yes  Elevated toilet seat or a handicapped toilet? Yes   TIMED UP AND GO:  Was the test performed? No . Telephonic visit  Cognitive Function: MMSE - Mini Mental State Exam 03/22/2017  Orientation to time 5  Orientation to Place 5  Registration 3  Attention/ Calculation 5  Recall 3  Language- name 2 objects 2  Language- repeat 1  Language- follow 3 step  command 3  Language- read & follow direction 1  Write a sentence 1  Copy design 1  Total score 30     6CIT Screen 08/05/2021 08/02/2020 07/26/2019  What Year? 0 points 0 points 0 points  What month? 0 points 0 points 0 points  What time? 0 points 0 points 0 points  Count back from 20 0 points 0 points 0 points  Months in reverse 0 points 0 points 0 points  Repeat phrase 2 points 2 points 4 points  Total Score 2 2 4     Immunizations Immunization History  Administered Date(s) Administered   Fluad Quad(high Dose 65+) 04/01/2020   Influenza, High Dose Seasonal PF 04/04/2018   Influenza,inj,Quad PF,6+ Mos 03/31/2017   Influenza-Unspecified 04/07/2016, 04/30/2017, 04/04/2018, 02/28/2019   Moderna Sars-Covid-2 Vaccination 07/11/2019, 08/11/2019, 04/24/2020   Pneumococcal Conjugate-13 08/22/2014   Pneumococcal Polysaccharide-23 04/11/1997   Zoster Recombinat (Shingrix) 03/22/2017, 06/02/2017    TDAP status: Due, Education has been provided regarding the importance of this vaccine. Advised may receive this vaccine at local pharmacy or Health Dept. Aware to provide a copy of the vaccination record if obtained from local pharmacy or Health Dept. Verbalized acceptance and understanding.  Flu Vaccine status: Up to date  Pneumococcal vaccine status: Up to date  Covid-19 vaccine status: Completed vaccines  Qualifies for Shingles Vaccine? Yes   Zostavax completed Yes   Shingrix Completed?: Yes  Screening Tests Health Maintenance  Topic Date Due   FOOT EXAM  12/09/2019   COVID-19 Vaccine (4 - Booster for Moderna series) 06/19/2020  INFLUENZA VACCINE  09/26/2021 (Originally 01/27/2021)   TETANUS/TDAP  11/15/2021 (Originally 01/14/1943)   HEMOGLOBIN A1C  11/11/2021   OPHTHALMOLOGY EXAM  05/06/2022   Pneumonia Vaccine 28+ Years old  Completed   Zoster Vaccines- Shingrix  Completed   HPV VACCINES  Aged Out   DEXA SCAN  Discontinued    Health Maintenance  Health Maintenance Due  Topic  Date Due   FOOT EXAM  12/09/2019   COVID-19 Vaccine (4 - Booster for Moderna series) 06/19/2020    Colorectal cancer screening: No longer required.   Mammogram status: No longer required due to age.  DEXA not required  Lung Cancer Screening: (Low Dose CT Chest recommended if Age 38-80 years, 30 pack-year currently smoking OR have quit w/in 15years.) does not qualify.   Additional Screening:  Hepatitis C Screening: does not qualify  Vision Screening: Recommended annual ophthalmology exams for early detection of glaucoma and other disorders of the eye. Is the patient up to date with their annual eye exam?  Yes  Who is the provider or what is the name of the office in which the patient attends annual eye exams? [facility] If pt is not established with a provider, would they like to be referred to a provider to establish care? No .   Dental Screening: Recommended annual dental exams for proper oral hygiene  Community Resource Referral / Chronic Care Management: CRR required this visit?  No   CCM required this visit?  No      Plan:     I have personally reviewed and noted the following in the patients chart:   Medical and social history Use of alcohol, tobacco or illicit drugs  Current medications and supplements including opioid prescriptions.  Functional ability and status Nutritional status Physical activity Advanced directives List of other physicians Hospitalizations, surgeries, and ER visits in previous 12 months Vitals Screenings to include cognitive, depression, and falls Referrals and appointments  In addition, I have reviewed and discussed with patient certain preventive protocols, quality metrics, and best practice recommendations. A written personalized care plan for preventive services as well as general preventive health recommendations were provided to patient.     Sandrea Hammond, LPN   07/31/1937   Nurse Notes: Due for Diabetic foot exam at next  visit.

## 2021-08-05 NOTE — Patient Instructions (Signed)
Maria Alexander , Thank you for taking time to come for your Medicare Wellness Visit. I appreciate your ongoing commitment to your health goals. Please review the following plan we discussed and let me know if I can assist you in the future.   Screening recommendations/referrals: Colonoscopy: No longer required Mammogram: No longer required Bone Density: No longer required Recommended yearly ophthalmology/optometry visit for glaucoma screening and checkup Recommended yearly dental visit for hygiene and checkup  Vaccinations: Influenza vaccine: Done 03/27/2021 - Repeat annually  Pneumococcal vaccine: Done 04/11/1997 & 08/22/2014 Tdap vaccine: Due (recommended every 10 years) Shingles vaccine: Done 03/22/2017 & 06/02/2017   Covid-19:Done 07/11/2019, 08/11/2019, 04/24/2020, & 03/25/2021  Advanced directives: Advance directive discussed with you today. Even though you declined this today, please call our office should you change your mind, and we can give you the proper paperwork for you to fill out.   Conditions/risks identified: Keep up the great work! Continue practicing fall prevention and doing your chair exercises.  Next appointment: Follow up in one year for your annual wellness visit    Preventive Care 65 Years and Older, Female Preventive care refers to lifestyle choices and visits with your health care provider that can promote health and wellness. What does preventive care include? A yearly physical exam. This is also called an annual well check. Dental exams once or twice a year. Routine eye exams. Ask your health care provider how often you should have your eyes checked. Personal lifestyle choices, including: Daily care of your teeth and gums. Regular physical activity. Eating a healthy diet. Avoiding tobacco and drug use. Limiting alcohol use. Practicing safe sex. Taking low-dose aspirin every day. Taking vitamin and mineral supplements as recommended by your health care  provider. What happens during an annual well check? The services and screenings done by your health care provider during your annual well check will depend on your age, overall health, lifestyle risk factors, and family history of disease. Counseling  Your health care provider may ask you questions about your: Alcohol use. Tobacco use. Drug use. Emotional well-being. Home and relationship well-being. Sexual activity. Eating habits. History of falls. Memory and ability to understand (cognition). Work and work Statistician. Reproductive health. Screening  You may have the following tests or measurements: Height, weight, and BMI. Blood pressure. Lipid and cholesterol levels. These may be checked every 5 years, or more frequently if you are over 86 years old. Skin check. Lung cancer screening. You may have this screening every year starting at age 86 if you have a 30-pack-year history of smoking and currently smoke or have quit within the past 15 years. Fecal occult blood test (FOBT) of the stool. You may have this test every year starting at age 86. Flexible sigmoidoscopy or colonoscopy. You may have a sigmoidoscopy every 5 years or a colonoscopy every 10 years starting at age 86. Hepatitis C blood test. Hepatitis B blood test. Sexually transmitted disease (STD) testing. Diabetes screening. This is done by checking your blood sugar (glucose) after you have not eaten for a while (fasting). You may have this done every 1-3 years. Bone density scan. This is done to screen for osteoporosis. You may have this done starting at age 86. Mammogram. This may be done every 1-2 years. Talk to your health care provider about how often you should have regular mammograms. Talk with your health care provider about your test results, treatment options, and if necessary, the need for more tests. Vaccines  Your health care provider may  recommend certain vaccines, such as: Influenza vaccine. This is  recommended every year. Tetanus, diphtheria, and acellular pertussis (Tdap, Td) vaccine. You may need a Td booster every 10 years. Zoster vaccine. You may need this after age 86. Pneumococcal 13-valent conjugate (PCV13) vaccine. One dose is recommended after age 86. Pneumococcal polysaccharide (PPSV23) vaccine. One dose is recommended after age 86. Talk to your health care provider about which screenings and vaccines you need and how often you need them. This information is not intended to replace advice given to you by your health care provider. Make sure you discuss any questions you have with your health care provider. Document Released: 07/12/2015 Document Revised: 03/04/2016 Document Reviewed: 04/16/2015 Elsevier Interactive Patient Education  2017 [place] Prevention in the Home Falls can cause injuries. They can happen to people of all ages. There are many things you can do to make your home safe and to help prevent falls. What can I do on the outside of my home? Regularly fix the edges of walkways and driveways and fix any cracks. Remove anything that might make you trip as you walk through a door, such as a raised step or threshold. Trim any bushes or trees on the path to your home. Use bright outdoor lighting. Clear any walking paths of anything that might make someone trip, such as rocks or tools. Regularly check to see if handrails are loose or broken. Make sure that both sides of any steps have handrails. Any raised decks and porches should have guardrails on the edges. Have any leaves, snow, or ice cleared regularly. Use sand or salt on walking paths during winter. Clean up any spills in your garage right away. This includes oil or grease spills. What can I do in the bathroom? Use night lights. Install grab bars by the toilet and in the tub and shower. Do not use towel bars as grab bars. Use non-skid mats or decals in the tub or shower. If you need to sit down in  the shower, use a plastic, non-slip stool. Keep the floor dry. Clean up any water that spills on the floor as soon as it happens. Remove soap buildup in the tub or shower regularly. Attach bath mats securely with double-sided non-slip rug tape. Do not have throw rugs and other things on the floor that can make you trip. What can I do in the bedroom? Use night lights. Make sure that you have a light by your bed that is easy to reach. Do not use any sheets or blankets that are too big for your bed. They should not hang down onto the floor. Have a firm chair that has side arms. You can use this for support while you get dressed. Do not have throw rugs and other things on the floor that can make you trip. What can I do in the kitchen? Clean up any spills right away. Avoid walking on wet floors. Keep items that you use a lot in easy-to-reach places. If you need to reach something above you, use a strong step stool that has a grab bar. Keep electrical cords out of the way. Do not use floor polish or wax that makes floors slippery. If you must use wax, use non-skid floor wax. Do not have throw rugs and other things on the floor that can make you trip. What can I do with my stairs? Do not leave any items on the stairs. Make sure that there are handrails on both sides of  the stairs and use them. Fix handrails that are broken or loose. Make sure that handrails are as long as the stairways. Check any carpeting to make sure that it is firmly attached to the stairs. Fix any carpet that is loose or worn. Avoid having throw rugs at the top or bottom of the stairs. If you do have throw rugs, attach them to the floor with carpet tape. Make sure that you have a light switch at the top of the stairs and the bottom of the stairs. If you do not have them, ask someone to add them for you. What else can I do to help prevent falls? Wear shoes that: Do not have high heels. Have rubber bottoms. Are comfortable  and fit you well. Are closed at the toe. Do not wear sandals. If you use a stepladder: Make sure that it is fully opened. Do not climb a closed stepladder. Make sure that both sides of the stepladder are locked into place. Ask someone to hold it for you, if possible. Clearly mark and make sure that you can see: Any grab bars or handrails. First and last steps. Where the edge of each step is. Use tools that help you move around (mobility aids) if they are needed. These include: Canes. Walkers. Scooters. Crutches. Turn on the lights when you go into a dark area. Replace any light bulbs as soon as they burn out. Set up your furniture so you have a clear path. Avoid moving your furniture around. If any of your floors are uneven, fix them. If there are any pets around you, be aware of where they are. Review your medicines with your doctor. Some medicines can make you feel dizzy. This can increase your chance of falling. Ask your doctor what other things that you can do to help prevent falls. This information is not intended to replace advice given to you by your health care provider. Make sure you discuss any questions you have with your health care provider. Document Released: 04/11/2009 Document Revised: 11/21/2015 Document Reviewed: 07/20/2014 Elsevier Interactive Patient Education  2017 [employer].

## 2021-08-08 NOTE — Progress Notes (Signed)
Received notification from AZ&ME regarding approval for ONGLYZA. Patient assistance approved from 08/04/21 to 06/28/22.  SHIPMENT SHOULD ARRIVE IN 7-10 BUSINESS DAYS  Phone: [phone]

## 2021-08-26 ENCOUNTER — Ambulatory Visit (INDEPENDENT_AMBULATORY_CARE_PROVIDER_SITE_OTHER): Payer: Medicare Other | Admitting: Pharmacist

## 2021-08-26 DIAGNOSIS — I152 Hypertension secondary to endocrine disorders: Secondary | ICD-10-CM

## 2021-08-26 DIAGNOSIS — E1159 Type 2 diabetes mellitus with other circulatory complications: Secondary | ICD-10-CM

## 2021-08-26 DIAGNOSIS — E1169 Type 2 diabetes mellitus with other specified complication: Secondary | ICD-10-CM

## 2021-08-27 MED ORDER — SAXAGLIPTIN HCL 5 MG PO TABS: 5.0000 mg | ORAL_TABLET | Freq: Every day | ORAL | 5 refills | Status: DC

## 2021-08-27 NOTE — Progress Notes (Signed)
Chronic Care Management Pharmacy Note  08/26/2021 Name:  Maria Alexander MRN:  [id number] DOB:  August 23, 1923  Summary: T2DM  Recommendations/Changes made from today's visit:  Diabetes: Goal on Track (progressing): YES. Controlled-A1C 6.5%; current treatment: GLIPIZIDE 10MG W/ BREAKFAST, ONGLYZA 5MG;  PATIENT STATES COST OF [name] IS >$150/MONTH (ENDS UP IN COVERAGE GAP) [name] REPLACED WITH ONGLYZA 5MG DAILY SAMPLES PATIENT TOLERATING WELL APPLICATION SENT TO AZ&ME PATIENT ASSISTANCE PROGRAM FOR FREE ONGLYZA ESCRIBED TO [place] FOR PATIENT ASSISTANCE FIRST PATIENT SHIPMENT ARRIVED TO PCP OFFICE TODAY WILL [place] A1C TO SEE PROGRESS AT NEXT VISIT IN MAY 2023 PLAN TO D/C GLIPIZIDE GIVEN AGE AND RISK OF HYPOGLYCEMIA  Subjective: Maria Alexander is an 88 y.o. year old female who is a primary patient of Janora Norlander, DO.  The CCM team was consulted for assistance with disease management and care coordination needs.    Engaged with patient by telephone for follow up visit in response to provider referral for pharmacy case management and/or care coordination services.   Consent to Services:  The patient was given information about Chronic Care Management services, agreed to services, and gave verbal consent prior to initiation of services.  Please see initial visit note for detailed documentation.   Patient Care Team: Janora Norlander, DO as PCP - General (Family Medicine) Lavera Guise, [facility] as Pharmacist (Family Medicine)  Objective:  Lab Results  Component Value Date   CREATININE 1.01 (H) 05/14/2021   CREATININE 0.88 03/17/2021   CREATININE 0.81 07/22/2020    Lab Results  Component Value Date   HGBA1C 6.5 (H) 05/14/2021   Last diabetic Eye exam:  Lab Results  Component Value Date/Time   HMDIABEYEEXA No Retinopathy 05/06/2021 12:00 AM    Last diabetic Foot exam: No results found for: HMDIABFOOTEX      Component Value Date/Time   CHOL 159 05/14/2021 1045    TRIG 131 05/14/2021 1045   HDL 46 05/14/2021 1045   CHOLHDL 3.5 05/14/2021 1045   LDLCALC 90 05/14/2021 1045    Hepatic Function Latest Ref Rng & Units 05/14/2021 07/22/2020 09/13/2019  Total Protein 6.0 - 8.5 g/dL 6.2 6.4 6.0  Albumin 3.5 - 4.6 g/dL 4.5 4.3 4.2  AST 0 - 40 IU/L _0 ALT 0 - 32 IU/L _1 Alk Phosphatase 44 - 121 IU/L 71 76 70  Total Bilirubin 0.0 - 1.2 mg/dL 0.6 0.3 0.6    Lab Results  Component Value Date/Time   TSH 3.030 05/14/2021 10:45 AM   TSH 2.760 07/22/2020 01:45 PM   FREET4 1.36 05/14/2021 10:45 AM    CBC Latest Ref Rng & Units 09/13/2019 12/09/2018 07/10/2018  WBC 3.4 - 10.8 x10E3/uL 5.7 5.5 -  Hemoglobin 11.1 - 15.9 g/dL 13.1 12.2 11.6(L)  Hematocrit 34.0 - 46.6 % 38.9 38.2 34.0(L)  Platelets 150 - 450 x10E3/uL 142(L) 157 -    No results found for: VD25OH  Clinical ASCVD: No  The ASCVD Risk score (Arnett DK, et al., 2019) failed to calculate for the following reasons:   The 2019 ASCVD risk score is only valid for ages 35 to 44    Other: (CHADS2VASc if Afib, PHQ9 if depression, MMRC or CAT for COPD, ACT, DEXA)  Social History   Tobacco Use  Smoking Status Former   Types: Cigarettes   Quit date: 02/05/1995   Years since quitting: 26.5  Smokeless Tobacco Never   BP Readings from Last 3 Encounters:  05/14/21 (!) 148/69  04/23/21 (!) 118/49  03/19/21 (!) 130/58   Pulse Readings from Last 3 Encounters:  05/14/21 68  04/23/21 69  03/19/21 62   Wt Readings from Last 3 Encounters:  08/05/21 125 lb (56.7 kg)  05/14/21 125 lb 12.8 oz (57.1 kg)  03/17/21 129 lb (58.5 kg)    Assessment: Review of patient past medical history, allergies, medications, health status, including review of consultants reports, laboratory and other test data, was performed as part of comprehensive evaluation and provision of chronic care management services.   SDOH:  (Social Determinants of Health) assessments and interventions performed:    CCM Care  Plan  Allergies  Allergen Reactions   Shellfish Allergy Hives    Medications Reviewed Today     Reviewed by Sandrea Hammond, LPN (Licensed Practical Nurse) on 08/05/21 at 1124  Med List Status: <None>   Medication Order Taking? Sig Documenting Provider Last Dose Status Informant  ALPRAZolam (XANAX) 1 MG tablet [id number] Yes Take 0.5-1 tablets (0.5-1 mg total) by mouth at bedtime as needed for anxiety (place on file.). Ronnie Doss M, DO Taking Active   atorvastatin (LIPITOR) 80 MG tablet [id number] Yes Take 1/2 tablet (40 mg total) by mouth daily. Ronnie Doss M, DO Taking Active Self  glipiZIDE (GLUCOTROL XL) 5 MG 24 hr tablet [id number] Yes Take 2 tablets (10 mg total) by mouth daily with breakfast. Janora Norlander, DO Taking Active Self  levothyroxine (SYNTHROID) 50 MCG tablet [id number] Yes TAKE (1) TABLET DAILY BE- FORE BREAKFAST. Ronnie Doss M, DO Taking Active   losartan (COZAAR) 100 MG tablet [id number] Yes Take 1 tablet (100 mg total) by mouth daily. Ronnie Doss M, DO Taking Active Self  Multiple Vitamin (MULTIVITAMIN) tablet [id number] Yes Take 1 tablet by mouth daily. [facility] 50+ [provider] Taking Active Self  pantoprazole (PROTONIX) 40 MG tablet [id number] Yes Take 1 tablet (40 mg total) by mouth daily before breakfast. Rehman, Mechele Dawley, MD Taking Active Self  Polyethyl Glycol-Propyl Glycol (LUBRICANT EYE DROPS) 0.4-0.3 % SOLN [id number] Yes Place 1-2 drops into both eyes 3 (three) times daily as needed (dry/irritated eyes.). [provider] Taking Active Self  saxagliptin HCl (ONGLYZA) 5 MG TABS tablet [id number] Yes Take 5 mg by mouth daily. [provider] Taking Active            Med Note Leisa Lenz Jun 18, 2021  1:26 PM) Samples given            Patient Active Problem List   Diagnosis Date Noted   Esophageal dysphagia 03/04/2021   History of esophageal stricture 03/04/2021   Hyperlipidemia  associated with type 2 diabetes mellitus ([place]) 12/15/2019   Pain in thoracic spine 04/25/2019   Lumbar pain 04/25/2019   Hypertension associated with diabetes ([place]) 12/09/2018   Anxiety 11/29/2017   Chronic hoarseness 01/15/2017   Hypothyroidism 06/06/2014   Diabetes ([place]) 06/06/2014    Immunization History  Administered Date(s) Administered   Fluad Quad(high Dose 65+) 04/01/2020   Influenza, High Dose Seasonal PF 04/04/2018   Influenza,inj,Quad PF,6+ Mos 03/31/2017   Influenza-Unspecified 04/07/2016, 04/04/2018, 02/28/2019, 03/27/2021   Moderna Sars-Covid-2 Vaccination 07/11/2019, 08/11/2019, 04/24/2020, 03/27/2021   Pneumococcal Conjugate-13 08/22/2014   Pneumococcal Polysaccharide-23 04/11/1997   Zoster Recombinat (Shingrix) 03/22/2017, 06/02/2017    Conditions to be addressed/monitored: HLD and DMII  Care Plan : PHARMD MEDICATION MANAGEMENT  Updates made by Lavera Guise, [place] since 08/27/2021 12:00 AM     Problem: DISEASE PROGRESSION PREVENTION  Long-Range Goal: T2DM PHARMD GOAL   Recent Progress: On track  Priority: High  Note:   Current Barriers:  Unable to independently afford treatment regimen Suboptimal therapeutic regimen for T2DM  Pharmacist Clinical Goal(s):  patient will verbalize ability to afford treatment regimen adhere to plan to optimize therapeutic regimen for T2DM as evidenced by report of adherence to recommended medication management changes through collaboration with PharmD and provider.   Interventions: 1:1 collaboration with Janora Norlander, DO regarding development and update of comprehensive plan of care as evidenced by provider attestation and co-signature Inter-disciplinary care team collaboration (see longitudinal plan of care) Comprehensive medication review performed; medication list updated in electronic medical record  Diabetes: Goal on Track (progressing): YES. Controlled-A1C 6.5%; current treatment: GLIPIZIDE 10MG W/  BREAKFAST, ONGLYZA 5MG;  PATIENT STATES COST OF [name] IS >$150/MONTH (ENDS UP IN COVERAGE GAP) [name] REPLACED WITH ONGLYZA 5MG DAILY SAMPLES PATIENT TOLERATING WELL APPLICATION SENT TO AZ&ME PATIENT ASSISTANCE PROGRAM FOR FREE ONGLYZA ESCRIBED TO [place] FOR PATIENT ASSISTANCE FIRST PATIENT SHIPMENT ARRIVED TO PCP OFFICE TODAY WILL RECHK A1C TO SEE PROGRESS AT NEXT VISIT IN MAY 2023 PLAN TO D/C GLIPIZIDE GIVEN AGE AND RISK OF HYPOGLYCEMIA GFR 51 Current glucose readings: fasting glucose: N/A, post prandial glucose: N/A Denies hypoglycemic/hyperglycemic symptoms Current exercise: N/A; PATIENT STILL ACTIVE/DRIVES Recommended CONTINUE ONGLYZA, POTENTIALLY D/C GLIPIZIDE DUE TO HYPOGYLCEMIA RISK DISCUSSED CHOLESTEROL AND LIPID PANEL CONTINUE ATORVASTATIN 20MG DAILY FOR NOW GOAL<70,CONTINUE TO ADDRESS Lipid Panel     Component Value Date/Time   CHOL 159 05/14/2021 1045   TRIG 131 05/14/2021 1045   HDL 46 05/14/2021 1045   CHOLHDL 3.5 05/14/2021 1045   [place] 90 05/14/2021 1045   LABVLDL 23 05/14/2021 1045    Patient Goals/Self-Care Activities patient will:  - take medications as prescribed as evidenced by patient report and record review check glucose DAILY (FASTING), document, and provide at future appointments      Medication Assistance:  onglyza obtained through az&me medication assistance program.  Enrollment ends 06/28/22  Patient's preferred pharmacy is:  [facility], [address] [address] Phone: [phone] Fax: [phone]   Follow Up:  Patient agrees to Care Plan and Follow-up.  Plan: Telephone follow up appointment with care management team member scheduled for:  3 months   Regina Eck, PharmD, BCPS Clinical Pharmacist, [place]  II Phone [phone]

## 2021-08-27 NOTE — Patient Instructions (Signed)
Visit Information  Following are the goals we discussed today:   Diabetes: Goal on Track (progressing): YES. Controlled-A1C 6.5%; current treatment: GLIPIZIDE 10MG  W/ BREAKFAST, ONGLYZA 5MG ;  PATIENT STATES COST OF [name] IS >$150/MONTH (ENDS UP IN COVERAGE GAP) [name] REPLACED WITH ONGLYZA 5MG  DAILY SAMPLES PATIENT TOLERATING WELL APPLICATION SENT TO AZ&ME PATIENT ASSISTANCE PROGRAM FOR FREE ONGLYZA ESCRIBED TO [place] FOR PATIENT ASSISTANCE FIRST PATIENT SHIPMENT ARRIVED TO PCP OFFICE TODAY WILL RECHK A1C TO SEE PROGRESS AT NEXT VISIT IN MAY 2023 PLAN TO D/C GLIPIZIDE GIVEN AGE AND RISK OF HYPOGLYCEMIA  Plan: Telephone follow up appointment with care management team member scheduled for:  3 months  Signature Regina Eck, PharmD, BCPS Clinical Pharmacist, [place]  [facility] Phone [phone]  Please call the care guide team at [phone] if you need to cancel or reschedule your appointment.   The patient verbalized understanding of instructions, educational materials, and care plan provided today and declined offer to receive copy of patient instructions, educational materials, and care plan.

## 2021-09-06 DIAGNOSIS — L0390 Cellulitis, unspecified: Secondary | ICD-10-CM | POA: Diagnosis not present

## 2021-09-08 DIAGNOSIS — C44722 Squamous cell carcinoma of skin of right lower limb, including hip: Secondary | ICD-10-CM | POA: Diagnosis not present

## 2021-09-10 ENCOUNTER — Other Ambulatory Visit: Payer: Self-pay | Admitting: Family Medicine

## 2021-09-10 DIAGNOSIS — E7800 Pure hypercholesterolemia, unspecified: Secondary | ICD-10-CM

## 2021-09-17 DIAGNOSIS — L570 Actinic keratosis: Secondary | ICD-10-CM | POA: Diagnosis not present

## 2021-09-17 DIAGNOSIS — D0472 Carcinoma in situ of skin of left lower limb, including hip: Secondary | ICD-10-CM | POA: Diagnosis not present

## 2021-09-17 DIAGNOSIS — Z85828 Personal history of other malignant neoplasm of skin: Secondary | ICD-10-CM | POA: Diagnosis not present

## 2021-09-17 DIAGNOSIS — Z08 Encounter for follow-up examination after completed treatment for malignant neoplasm: Secondary | ICD-10-CM | POA: Diagnosis not present

## 2021-09-17 DIAGNOSIS — X32XXXD Exposure to sunlight, subsequent encounter: Secondary | ICD-10-CM | POA: Diagnosis not present

## 2021-09-18 ENCOUNTER — Other Ambulatory Visit (INDEPENDENT_AMBULATORY_CARE_PROVIDER_SITE_OTHER): Payer: Self-pay | Admitting: Internal Medicine

## 2021-09-18 NOTE — Telephone Encounter (Signed)
Per dr Laural Golden - pt had erosion in esophagus and should continue med. If she would like she could take '20mg'$  instead of '40mg'$  but should be on med.  ?

## 2021-09-18 NOTE — Telephone Encounter (Signed)
Received a vm from steve at [facility]. States he canceled rx because patient did not want med any longer. I tried to call pt to get more information on why she did not want med. No answer.  ?

## 2021-09-18 NOTE — Telephone Encounter (Signed)
Pt states she was taking a goody powder daily and that's why she was having issues with reflux and has stopped that months ago and does not feel she needs med anymore.  ?

## 2021-09-19 ENCOUNTER — Other Ambulatory Visit (INDEPENDENT_AMBULATORY_CARE_PROVIDER_SITE_OTHER): Payer: Self-pay | Admitting: *Deleted

## 2021-09-19 MED ORDER — PANTOPRAZOLE SODIUM 20 MG PO TBEC: 20.0000 mg | DELAYED_RELEASE_TABLET | Freq: Every day | ORAL | 1 refills | Status: DC

## 2021-09-19 NOTE — Telephone Encounter (Signed)
Called and discussed with patient and she is willing to stay on med and would like the '20mg'$  instead of '40mg'$ . I called and spoke to pharmacy and let pharmacy know to cancel '40mg'$  and to deliver '20mg'$  to pt.  ?

## 2021-09-21 ENCOUNTER — Observation Stay (HOSPITAL_COMMUNITY): Payer: Medicare Other

## 2021-09-21 ENCOUNTER — Inpatient Hospital Stay (HOSPITAL_BASED_OUTPATIENT_CLINIC_OR_DEPARTMENT_OTHER)
Admission: EM | Admit: 2021-09-21 | Discharge: 2021-09-23 | DRG: 603 | Disposition: A | Discharge status: Home / Self Care | Payer: Medicare Other | Attending: Internal Medicine | Admitting: Internal Medicine

## 2021-09-21 ENCOUNTER — Other Ambulatory Visit: Payer: Self-pay

## 2021-09-21 ENCOUNTER — Encounter (HOSPITAL_BASED_OUTPATIENT_CLINIC_OR_DEPARTMENT_OTHER): Payer: Self-pay

## 2021-09-21 DIAGNOSIS — E7800 Pure hypercholesterolemia, unspecified: Secondary | ICD-10-CM | POA: Diagnosis not present

## 2021-09-21 DIAGNOSIS — Z87891 Personal history of nicotine dependence: Secondary | ICD-10-CM

## 2021-09-21 DIAGNOSIS — C44722 Squamous cell carcinoma of skin of right lower limb, including hip: Secondary | ICD-10-CM | POA: Diagnosis present

## 2021-09-21 DIAGNOSIS — L97509 Non-pressure chronic ulcer of other part of unspecified foot with unspecified severity: Secondary | ICD-10-CM | POA: Diagnosis present

## 2021-09-21 DIAGNOSIS — Z833 Family history of diabetes mellitus: Secondary | ICD-10-CM | POA: Diagnosis not present

## 2021-09-21 DIAGNOSIS — D696 Thrombocytopenia, unspecified: Secondary | ICD-10-CM | POA: Diagnosis present

## 2021-09-21 DIAGNOSIS — N1831 Chronic kidney disease, stage 3a: Secondary | ICD-10-CM | POA: Diagnosis not present

## 2021-09-21 DIAGNOSIS — Z91013 Allergy to seafood: Secondary | ICD-10-CM | POA: Diagnosis not present

## 2021-09-21 DIAGNOSIS — Z79890 Hormone replacement therapy (postmenopausal): Secondary | ICD-10-CM

## 2021-09-21 DIAGNOSIS — L97919 Non-pressure chronic ulcer of unspecified part of right lower leg with unspecified severity: Secondary | ICD-10-CM | POA: Diagnosis not present

## 2021-09-21 DIAGNOSIS — E1169 Type 2 diabetes mellitus with other specified complication: Secondary | ICD-10-CM | POA: Diagnosis present

## 2021-09-21 DIAGNOSIS — I129 Hypertensive chronic kidney disease with stage 1 through stage 4 chronic kidney disease, or unspecified chronic kidney disease: Secondary | ICD-10-CM | POA: Diagnosis not present

## 2021-09-21 DIAGNOSIS — Z7984 Long term (current) use of oral hypoglycemic drugs: Secondary | ICD-10-CM | POA: Diagnosis not present

## 2021-09-21 DIAGNOSIS — Z8673 Personal history of transient ischemic attack (TIA), and cerebral infarction without residual deficits: Secondary | ICD-10-CM

## 2021-09-21 DIAGNOSIS — E039 Hypothyroidism, unspecified: Secondary | ICD-10-CM | POA: Diagnosis present

## 2021-09-21 DIAGNOSIS — Z86718 Personal history of other venous thrombosis and embolism: Secondary | ICD-10-CM

## 2021-09-21 DIAGNOSIS — Z8249 Family history of ischemic heart disease and other diseases of the circulatory system: Secondary | ICD-10-CM | POA: Diagnosis not present

## 2021-09-21 DIAGNOSIS — E11621 Type 2 diabetes mellitus with foot ulcer: Secondary | ICD-10-CM | POA: Diagnosis present

## 2021-09-21 DIAGNOSIS — E1165 Type 2 diabetes mellitus with hyperglycemia: Secondary | ICD-10-CM | POA: Diagnosis present

## 2021-09-21 DIAGNOSIS — Z79899 Other long term (current) drug therapy: Secondary | ICD-10-CM

## 2021-09-21 DIAGNOSIS — L03115 Cellulitis of right lower limb: Principal | ICD-10-CM | POA: Diagnosis present

## 2021-09-21 DIAGNOSIS — E1122 Type 2 diabetes mellitus with diabetic chronic kidney disease: Secondary | ICD-10-CM | POA: Diagnosis present

## 2021-09-21 DIAGNOSIS — S81801A Unspecified open wound, right lower leg, initial encounter: Secondary | ICD-10-CM | POA: Diagnosis not present

## 2021-09-21 DIAGNOSIS — L0390 Cellulitis, unspecified: Secondary | ICD-10-CM | POA: Diagnosis present

## 2021-09-21 DIAGNOSIS — R011 Cardiac murmur, unspecified: Secondary | ICD-10-CM | POA: Diagnosis present

## 2021-09-21 DIAGNOSIS — E119 Type 2 diabetes mellitus without complications: Secondary | ICD-10-CM

## 2021-09-21 LAB — BASIC METABOLIC PANEL
Anion gap: 7 (ref 5–15)
BUN: 16 mg/dL (ref 8–23)
CO2: 28 mmol/L (ref 22–32)
Calcium: 9.5 mg/dL (ref 8.9–10.3)
Chloride: 104 mmol/L (ref 98–111)
Creatinine, Ser: 0.98 mg/dL (ref 0.44–1.00)
GFR, Estimated: 53 mL/min — ABNORMAL LOW (ref >60)
Glucose, Bld: 74 mg/dL (ref 70–99)
Potassium: 3.7 mmol/L (ref 3.5–5.1)
Sodium: 139 mmol/L (ref 135–145)

## 2021-09-21 LAB — CBC WITH DIFFERENTIAL/PLATELET
Abs Immature Granulocytes: 0.02 10*3/uL (ref 0.00–0.07)
Basophils Absolute: 0 10*3/uL (ref 0.0–0.1)
Basophils Relative: 1 %
Eosinophils Absolute: 0.2 10*3/uL (ref 0.0–0.5)
Eosinophils Relative: 2 %
HCT: 35.4 % — ABNORMAL LOW (ref 36.0–46.0)
Hemoglobin: 12 g/dL (ref 12.0–15.0)
Immature Granulocytes: 0 %
Lymphocytes Relative: 20 %
Lymphs Abs: 1.4 10*3/uL (ref 0.7–4.0)
MCH: 31.5 pg (ref 26.0–34.0)
MCHC: 33.9 g/dL (ref 30.0–36.0)
MCV: 92.9 fL (ref 80.0–100.0)
Monocytes Absolute: 0.7 10*3/uL (ref 0.1–1.0)
Monocytes Relative: 10 %
Neutro Abs: 4.4 10*3/uL (ref 1.7–7.7)
Neutrophils Relative %: 67 %
Platelets: 135 10*3/uL — ABNORMAL LOW (ref 150–400)
RBC: 3.81 MIL/uL — ABNORMAL LOW (ref 3.87–5.11)
RDW: 13.7 % (ref 11.5–15.5)
WBC: 6.6 10*3/uL (ref 4.0–10.5)
nRBC: 0 % (ref 0.0–0.2)

## 2021-09-21 LAB — GLUCOSE, CAPILLARY: Glucose-Capillary: 74 mg/dL (ref 70–99)

## 2021-09-21 LAB — CBG MONITORING, ED: Glucose-Capillary: 241 mg/dL — ABNORMAL HIGH (ref 70–99)

## 2021-09-21 MED ORDER — ACETAMINOPHEN 325 MG PO TABS: 650.0000 mg | ORAL_TABLET | Freq: Four times a day (QID) | ORAL | Status: DC | PRN

## 2021-09-21 MED ORDER — POLYVINYL ALCOHOL 1.4 % OP SOLN
1.0000 [drp] | Freq: Three times a day (TID) | OPHTHALMIC | Status: DC | PRN
  Filled 2021-09-21: qty 15

## 2021-09-21 MED ORDER — ENOXAPARIN SODIUM 30 MG/0.3ML IJ SOSY
30.0000 mg | PREFILLED_SYRINGE | INTRAMUSCULAR | Status: DC
  Administered 2021-09-22 – 2021-09-23 (×2): 30 mg via SUBCUTANEOUS
  Filled 2021-09-21 (×2): qty 0.3

## 2021-09-21 MED ORDER — PANTOPRAZOLE SODIUM 20 MG PO TBEC
20.0000 mg | DELAYED_RELEASE_TABLET | Freq: Every day | ORAL | Status: DC
  Administered 2021-09-22 – 2021-09-23 (×2): 20 mg via ORAL
  Filled 2021-09-21 (×2): qty 1

## 2021-09-21 MED ORDER — ATORVASTATIN CALCIUM 40 MG PO TABS
40.0000 mg | ORAL_TABLET | Freq: Every day | ORAL | Status: DC
  Administered 2021-09-22 – 2021-09-23 (×2): 40 mg via ORAL
  Filled 2021-09-21 (×2): qty 1

## 2021-09-21 MED ORDER — POLYETHYL GLYCOL-PROPYL GLYCOL 0.4-0.3 % OP SOLN: 1.0000 [drp] | Freq: Three times a day (TID) | OPHTHALMIC | Status: DC | PRN

## 2021-09-21 MED ORDER — ALPRAZOLAM 0.5 MG PO TABS
0.5000 mg | ORAL_TABLET | Freq: Every evening | ORAL | Status: DC | PRN
  Administered 2021-09-21: 0.5 mg via ORAL
  Filled 2021-09-21: qty 1

## 2021-09-21 MED ORDER — CEFAZOLIN SODIUM-DEXTROSE 2-4 GM/100ML-% IV SOLN
2.0000 g | Freq: Two times a day (BID) | INTRAVENOUS | Status: DC
  Administered 2021-09-22 – 2021-09-23 (×3): 2 g via INTRAVENOUS
  Filled 2021-09-21 (×3): qty 100

## 2021-09-21 MED ORDER — CEFAZOLIN SODIUM-DEXTROSE 2-4 GM/100ML-% IV SOLN
2.0000 g | Freq: Once | INTRAVENOUS | Status: AC
  Administered 2021-09-21: 2 g via INTRAVENOUS
  Filled 2021-09-21: qty 100

## 2021-09-21 MED ORDER — LEVOTHYROXINE SODIUM 50 MCG PO TABS
50.0000 ug | ORAL_TABLET | Freq: Every day | ORAL | Status: DC
  Administered 2021-09-22 – 2021-09-23 (×2): 50 ug via ORAL
  Filled 2021-09-21 (×2): qty 1

## 2021-09-21 MED ORDER — LOSARTAN POTASSIUM 50 MG PO TABS
100.0000 mg | ORAL_TABLET | Freq: Every day | ORAL | Status: DC
  Administered 2021-09-22 – 2021-09-23 (×2): 100 mg via ORAL
  Filled 2021-09-21 (×2): qty 2

## 2021-09-21 MED ORDER — ACETAMINOPHEN 650 MG RE SUPP
650.0000 mg | Freq: Four times a day (QID) | RECTAL | Status: DC | PRN
Start: 2021-09-21 — End: 2021-09-23

## 2021-09-21 MED ORDER — INSULIN ASPART 100 UNIT/ML IJ SOLN: 0.0000 [IU] | Freq: Three times a day (TID) | INTRAMUSCULAR | Status: DC

## 2021-09-21 NOTE — ED Notes (Signed)
Report given to [facility] and the floor. ?

## 2021-09-21 NOTE — ED Triage Notes (Signed)
Pt. Tells [place] that her right loer leg (skin) has been somewhat sore and erythematous x 3 weeks. She was prescribed antibiotics, and ~ 2 weeks ago had a "skin cancer removed by a dermatologist". She is here today with c/o increasing discomfort and redness of right lower leg. She is alert and in no distress. ?

## 2021-09-21 NOTE — Progress Notes (Signed)
Pharmacy Antibiotic Note ? ?Maria Alexander is a 88 y.o. female admitted on 09/21/2021 with cellulitis.  Pharmacy has been consulted for cefazolin dosing. ? ?Plan: ?Received cefazolin 2g at 16:40 ?Will f/u SCr and continue dosing as appropriate based on CrCl ?  ? ?Temp (24hrs), Avg:98.6 ?F (37 ?C), Min:98.5 ?F (36.9 ?C), Max:98.7 ?F (37.1 ?C) ? ?No results for input(s): WBC, CREATININE, LATICACIDVEN, VANCOTROUGH, VANCOPEAK, VANCORANDOM, GENTTROUGH, GENTPEAK, GENTRANDOM, TOBRATROUGH, TOBRAPEAK, TOBRARND, AMIKACINPEAK, AMIKACINTROU, AMIKACIN in the last 168 hours.  ?CrCl cannot be calculated (Patient's most recent lab result is older than the maximum 21 days allowed.).   ? ?Allergies  ?Allergen Reactions  ? Shellfish Allergy Hives  ? ? ?Antimicrobials this admission: ?3/26 Cefazolin >> ? ?Dose adjustments this admission: ? ?Microbiology results: None ? ?Thank you for allowing pharmacy to be a part of this patient?s care. ? ?Peggyann Juba, PharmD, BCPS ?Pharmacy: [phone] ?09/21/2021 8:58 PM ? ?

## 2021-09-21 NOTE — H&P (Signed)
?History and Physical  ? ? ?Maria Alexander VEH:[id number] DOB: 1923/12/03 DOA: 09/21/2021 ? ?PCP: Janora Norlander, DO  ?Patient coming from: Home. ? ?Patient was transferred from [place]. ? ?Chief Complaint: Nonhealing wound in the right leg. ? ?HPI: Maria Alexander is a 88 y.o. female with history of hypertension diabetes prior history of DVT, hypothyroidism presents to the ER at med center with complaints of having a wound which is nonhealing.  Patient noticed a small dark area of skin around the right shin which was not getting better and had followed up with dermatologist had it biopsied.  Following which patient noted that the wound was not healing and had followed up with dermatologist was given doxycycline.  Since it is not healing patient follow-up with her primary care physician and was given Keflex.  Despite which wound was not healing and decided to come to the ER.  Denies any fever chills trauma or insect bites. ? ?ED Course: On exam patient has 2 wounds one is an ulcer with slough measuring around 1/2 cm another 1 is a necrotic eschar looking wound below the ulcer.  Mildly erythematous leg on the right lower extremity.  Has pulses on both lower extremities.  Labs unremarkable.  Patient admitted for further management.  Started on Ancef. ? ?Review of Systems: As per HPI, rest all negative. ? ? ?Past Medical History:  ?Diagnosis Date  ? Diabetes ([place])   ? DVT (deep venous thrombosis) ([place])   ? H/O blood clots 2000  ? High cholesterol   ? Hypothyroid   ? Stroke [facility])   ? ? ?Past Surgical History:  ?Procedure Laterality Date  ? BIOPSY  04/23/2021  ? Procedure: BIOPSY;  Surgeon: Rogene Houston, MD;  [facility];  Service: Endoscopy;;  esophageal  ? ESOPHAGEAL DILATION N/A 03/19/2021  ? Procedure: ESOPHAGEAL DILATION;  Surgeon: Rogene Houston, MD;  Location: [facility];  Service: Endoscopy;  Laterality: N/A;  ? ESOPHAGOGASTRODUODENOSCOPY (EGD) WITH PROPOFOL N/A 03/19/2021  ? Procedure:  ESOPHAGOGASTRODUODENOSCOPY (EGD) WITH PROPOFOL;  Surgeon: Rogene Houston, MD;  [facility];  Service: Endoscopy;  Laterality: N/A;  9:35  ? ESOPHAGOGASTRODUODENOSCOPY (EGD) WITH PROPOFOL N/A 04/23/2021  ? Procedure: ESOPHAGOGASTRODUODENOSCOPY (EGD) WITH PROPOFOL;  Surgeon: Rogene Houston, MD;  Location[facility];  Service: Endoscopy;  Laterality: N/A;  8:30  ? NO PAST SURGERIES    ? ? ? reports that she quit smoking about 26 years ago. Her smoking use included cigarettes. She has never used smokeless tobacco. She reports that she does not drink alcohol and does not use drugs. ? ?Allergies  ?Allergen Reactions  ? Shellfish Allergy Hives  ? ? ?Family History  ?Problem Relation Age of Onset  ? Cancer Mother   ? Heart disease Father   ?     before age 66  ? Heart attack Father   ? Diabetes Sister   ? Diabetes Brother   ? ? ?Prior to Admission medications   ?Medication Sig Start Date End Date Taking? Authorizing Provider  ?ALPRAZolam (XANAX) 1 MG tablet Take 0.5-1 tablets (0.5-1 mg total) by mouth at bedtime as needed for anxiety (place on file.). 05/14/21   Janora Norlander, DO  ?atorvastatin (LIPITOR) 80 MG tablet TAKE (1/2) TABLET DAILY. 09/10/21   Janora Norlander, DO  ?glipiZIDE (GLUCOTROL XL) 5 MG 24 hr tablet Take 2 tablets (10 mg total) by mouth daily with breakfast. 07/22/20   Janora Norlander, DO  ?  levothyroxine (SYNTHROID) 50 MCG tablet TAKE (1) TABLET DAILY BE- FORE BREAKFAST. 07/21/21   Ronnie Doss M, DO  ?losartan (COZAAR) 100 MG tablet Take 1 tablet (100 mg total) by mouth daily. 07/22/20   Janora Norlander, DO  ?Multiple Vitamin (MULTIVITAMIN) tablet Take 1 tablet by mouth daily. [facility] 50+    [provider]  ?pantoprazole (PROTONIX) 20 MG tablet Take 1 tablet (20 mg total) by mouth daily before breakfast. 09/19/21   Rehman, Mechele Dawley, MD  ?Polyethyl Glycol-Propyl Glycol (LUBRICANT EYE DROPS) 0.4-0.3 % SOLN Place 1-2 drops into both eyes 3 (three) times  daily as needed (dry/irritated eyes.).    [provider]  ?saxagliptin HCl (ONGLYZA) 5 MG TABS tablet Take 1 tablet (5 mg total) by mouth daily. 08/27/21   Janora Norlander, DO  ? ? ?Physical Exam: ?Constitutional: Moderately built and nourished. ?Vitals:  ? 09/21/21 1800 09/21/21 1830 09/21/21 1900 09/21/21 2042  ?BP: (!) 156/76 (!) 146/78 (!) 155/64 (!) 146/71  ?Pulse:    62  ?Resp: '13 20 17 16  '$ ?Temp:    98.7 ?F (37.1 ?C)  ?TempSrc:    Oral  ?SpO2:    99%  ? ?Eyes: Anicteric no pallor. ?ENMT: No discharge from the ears eyes nose and mouth. ?Neck: No mass felt.  No neck rigidity. ?Respiratory: No rhonchi or crepitations. ?Cardiovascular: S1-S2 heard. ?Abdomen: Soft nontender bowel sound present. ?Musculoskeletal: Ulcer on the right shin with an eschar looking wound on the area below the ulcer. ?Skin: Right half centimeter ulcer on the right shin with some slough on the base.  An eschar looking area measuring around 1 cm to be to the ulcer. ?Neurologic: Alert awake oriented time place and person.  Moves all extremities. ?Psychiatric: Appears normal.  Normal affect. ? ? ?Labs on Admission: I have personally reviewed following labs and imaging studies ? ?CBC: ?Recent Labs  ?Lab 09/21/21 ?2104  ?WBC 6.6  ?NEUTROABS 4.4  ?HGB 12.0  ?HCT 35.4*  ?MCV 92.9  ?PLT 135*  ? ?Basic Metabolic Panel: ?Recent Labs  ?Lab 09/21/21 ?2104  ?NA 139  ?K 3.7  ?CL 104  ?CO2 28  ?GLUCOSE 74  ?BUN 16  ?CREATININE 0.98  ?CALCIUM 9.5  ? ?GFR: ?CrCl cannot be calculated (Unknown ideal weight.). ?Liver Function Tests: ?No results for input(s): AST, ALT, ALKPHOS, BILITOT, PROT, ALBUMIN in the last 168 hours. ?No results for input(s): LIPASE, AMYLASE in the last 168 hours. ?No results for input(s): AMMONIA in the last 168 hours. ?Coagulation Profile: ?No results for input(s): INR, PROTIME in the last 168 hours. ?Cardiac Enzymes: ?No results for input(s): CKTOTAL, CKMB, CKMBINDEX, TROPONINI in the last 168 hours. ?BNP (last 3  results) ?No results for input(s): PROBNP in the last 8760 hours. ?HbA1C: ?No results for input(s): HGBA1C in the last 72 hours. ?CBG: ?Recent Labs  ?Lab 09/21/21 ?1406 09/21/21 ?2103  ?GLUCAP 241* 74  ? ?Lipid Profile: ?No results for input(s): CHOL, HDL, LDLCALC, TRIG, CHOLHDL, LDLDIRECT in the last 72 hours. ?Thyroid Function Tests: ?No results for input(s): TSH, T4TOTAL, FREET4, T3FREE, THYROIDAB in the last 72 hours. ?Anemia Panel: ?No results for input(s): VITAMINB12, FOLATE, FERRITIN, TIBC, IRON, RETICCTPCT in the last 72 hours. ?Urine analysis: ?No results found for: COLORURINE, APPEARANCEUR, [place], [place], [place], [place], [place], KETONESUR, PROTEINUR, [place], NITRITE, LEUKOCYTESUR ?Sepsis Labs: ?'@LABRCNTIP'$ (procalcitonin:4,lacticidven:4) ?)No results found for this or any previous visit (from the past 240 hour(s)).  ? ?Radiological Exams on Admission: ?No results found. ? ? ? ?Assessment/Plan ?Principal Problem: ?  Cellulitis ?Active Problems: ?  Hypothyroidism ?  Diabetes ([place]) ?  Hyperlipidemia associated with type 2 diabetes mellitus ([place]) ?  ? ?Cellulitis of right lower extremity with nonhealing wound/ulcer with an eschar looking area below the ulcer for which patient had failed outpatient oral antibiotics or IV antibiotics.  We will get wound team consult get x-ray of the right leg.  And also check ABI.  We need to get report from patient's dermatologist about the biopsy result. ?Diabetes mellitus type 2 we will keep patient on sliding scale coverage. ?Hypertension on ARB. ?Hypothyroidism on Synthroid. ?Hyperlipidemia on statins. ?Thrombocytopenia follow CBC.  During patient life patient has been previously thrombocytopenic at times. ? ? ?DVT prophylaxis: Lovenox. ?Code Status: Full code. ?Family Communication: We will need to discuss with patient's daughter. ?Disposition Plan: Home when stable. ?Consults called: Wound team. ?Admission status: Observation. ? ? ?Rise Patience  MD ?[facility] ?Pager [phone]. ? ?If 7PM-7AM, please contact night-coverage ?[email or web address] ?Password [email or web address] ? ?09/21/2021, 10:15 PM  ? ? ? ?

## 2021-09-21 NOTE — ED Provider Notes (Signed)
?[facility] EMERGENCY DEPT ?Provider Note ? ? ?CSN: [id number] ?Arrival date & time: 09/21/21  1349 ? ?  ? ?History ? ?Chief Complaint  ?Patient presents with  ? Wound problem  ? ? ?PAKOU RAINBOW is a 88 y.o. female. ? ?Patient is a 88 year old female with past medical of DM presenting for leg pain. Pt admits to nonhealing wound on left leg x several weeks with was biopsied and dx as sqamous cell carcinoma. States since bx the surrounding leg has become warm, swollen, and red. States her dermatologist put her on doxycyline 100 mg bid and took from 3/13-3/22 when her physician switched her to Keflex 500 mg tid 3/23-3/26 with no improvement.  ? ?The history is provided by the patient. No language interpreter was used.  ? ?  ? ?Home Medications ?Prior to Admission medications   ?Medication Sig Start Date End Date Taking? Authorizing Provider  ?ALPRAZolam (XANAX) 1 MG tablet Take 0.5-1 tablets (0.5-1 mg total) by mouth at bedtime as needed for anxiety (place on file.). 05/14/21   Janora Norlander, DO  ?atorvastatin (LIPITOR) 80 MG tablet TAKE (1/2) TABLET DAILY. 09/10/21   Janora Norlander, DO  ?glipiZIDE (GLUCOTROL XL) 5 MG 24 hr tablet Take 2 tablets (10 mg total) by mouth daily with breakfast. 07/22/20   Ronnie Doss M, DO  ?levothyroxine (SYNTHROID) 50 MCG tablet TAKE (1) TABLET DAILY BE- FORE BREAKFAST. 07/21/21   Janora Norlander, DO  ?losartan (COZAAR) 100 MG tablet Take 1 tablet (100 mg total) by mouth daily. 07/22/20   Janora Norlander, DO  ?Multiple Vitamin (MULTIVITAMIN) tablet Take 1 tablet by mouth daily. [facility] 50+    [provider]  ?pantoprazole (PROTONIX) 20 MG tablet Take 1 tablet (20 mg total) by mouth daily before breakfast. 09/19/21   Rehman, Mechele Dawley, MD  ?Polyethyl Glycol-Propyl Glycol (LUBRICANT EYE DROPS) 0.4-0.3 % SOLN Place 1-2 drops into both eyes 3 (three) times daily as needed (dry/irritated eyes.).    [provider]  ?saxagliptin HCl  (ONGLYZA) 5 MG TABS tablet Take 1 tablet (5 mg total) by mouth daily. 08/27/21   Janora Norlander, DO  ?   ? ?Allergies    ?Shellfish allergy   ? ?Review of Systems   ?Review of Systems  ?Constitutional:  Negative for chills and fever.  ?HENT:  Negative for ear pain and sore throat.   ?Eyes:  Negative for pain and visual disturbance.  ?Respiratory:  Negative for cough and shortness of breath.   ?Cardiovascular:  Negative for chest pain and palpitations.  ?Gastrointestinal:  Negative for abdominal pain and vomiting.  ?Genitourinary:  Negative for dysuria and hematuria.  ?Musculoskeletal:  Negative for arthralgias and back pain.  ?Skin:  Positive for color change, rash and wound.  ?Neurological:  Negative for seizures and syncope.  ?All other systems reviewed and are negative. ? ?Physical Exam ?Updated Vital Signs ?BP 108/60 (BP Location: Left Arm)   Pulse 80   Temp 98.5 ?F (36.9 ?C)   Resp 16   SpO2 96%  ?Physical Exam ?Vitals and nursing note reviewed.  ?Constitutional:   ?   General: She is not in acute distress. ?   Appearance: She is well-developed.  ?HENT:  ?   Head: Normocephalic and atraumatic.  ?Eyes:  ?   Conjunctiva/sclera: Conjunctivae normal.  ?Cardiovascular:  ?   Rate and Rhythm: Normal rate and regular rhythm.  ?   Heart sounds: No murmur heard. ?Pulmonary:  ?   Effort:  Pulmonary effort is normal. No respiratory distress.  ?   Breath sounds: Normal breath sounds.  ?Abdominal:  ?   Palpations: Abdomen is soft.  ?   Tenderness: There is no abdominal tenderness.  ?Musculoskeletal:     ?   General: No swelling.  ?   Cervical back: Neck supple.  ?Skin: ?   General: Skin is warm and dry.  ?   Capillary Refill: Capillary refill takes less than 2 seconds.  ?   Findings: Rash present.  ? ?    ?Neurological:  ?   Mental Status: She is alert.  ?Psychiatric:     ?   Mood and Affect: Mood normal.  ? ? ?ED Results / Procedures / Treatments   ?Labs ?(all labs ordered are listed, but only abnormal results are  displayed) ?Labs Reviewed  ?CBG MONITORING, ED - Abnormal; Notable for the following components:  ?    Result Value  ? Glucose-Capillary 241 (*)   ? All other components within normal limits  ? ? ?EKG ?None ? ?Radiology ?No results found. ? ?Procedures ?Procedures  ? ? ?Medications Ordered in ED ?Medications - No data to display ? ?ED Course/ Medical Decision Making/ A&P ?  ?                        ?Medical Decision Making ?Risk ?Prescription drug management. ?Decision regarding hospitalization. ? ? ?40:74 PM ? 88 year old female with past medical of DM presenting for leg pain. Patient is Aox3, no acute distress, afebrile, with stable vitals. Physical exam demonstrates erythema, swelling, and warmth surrounding wound.  ? ?Concerns for cellulitis without failed outpatient antibiotics x2.  Recommend admission for IV antibiotics at this time. Cefazolin started. Patient accepted by Dr. Flossie Buffy for admission.  ? ? ? ? ? ? ? ?Final Clinical Impression(s) / ED Diagnoses ?Final diagnoses:  ?Cellulitis of right lower extremity  ? ? ?Rx / DC Orders ?ED Discharge Orders   ? ? None  ? ?  ? ? ?  ?Lianne Cure, DO ?05/39/76 1814 ? ?

## 2021-09-21 NOTE — Progress Notes (Addendum)
Plan of Care Note for accepted transfer ? ? ?Patient: Maria Alexander MRN: [id number]   [place]: 09/21/2021 ? ?Facility requesting transfer: [facility] ?Requesting Provider: Campbell Stall, D.O ?Reason for transfer: cellulitis of the right LE, failed outpatient therapy ?Facility course:  ?89 year old female with history of hypertension, hypothyroidism and type 2 diabetes who presented with cellulitis of a nonhealing right lower extremity wound. ?Patient saw dermatology about 3 weeks ago with nonhealing wound that was biopsied and reported by family to be squamous cell carcinoma.  She was initially started on doxycycline '100mg'$  BID x 9 days but later switched to Keflex TID due to lack of improvement. Has been on Keflex so far for 3 days but has has worsening erythema.  ? ?Started on Ancef in ED.  ? ?Plan of care: ?The patient is accepted for admission to [place]  unit, at [facility]..  ?ED physician to continue care while patient remains in the ED ? ?Author: Orene Desanctis, DO ?09/21/2021 ? ?Check [email or web address] for on-call coverage. ? ?Nursing staff, Please call [place] number on Amion as soon as patient's arrival, so appropriate admitting provider can evaluate the pt. ?

## 2021-09-22 ENCOUNTER — Inpatient Hospital Stay (HOSPITAL_COMMUNITY): Payer: Medicare Other

## 2021-09-22 DIAGNOSIS — R011 Cardiac murmur, unspecified: Secondary | ICD-10-CM | POA: Diagnosis present

## 2021-09-22 DIAGNOSIS — Z87891 Personal history of nicotine dependence: Secondary | ICD-10-CM | POA: Diagnosis not present

## 2021-09-22 DIAGNOSIS — L97509 Non-pressure chronic ulcer of other part of unspecified foot with unspecified severity: Secondary | ICD-10-CM

## 2021-09-22 DIAGNOSIS — E1165 Type 2 diabetes mellitus with hyperglycemia: Secondary | ICD-10-CM | POA: Diagnosis present

## 2021-09-22 DIAGNOSIS — L0390 Cellulitis, unspecified: Secondary | ICD-10-CM

## 2021-09-22 DIAGNOSIS — L03115 Cellulitis of right lower limb: Secondary | ICD-10-CM | POA: Diagnosis present

## 2021-09-22 DIAGNOSIS — Z8249 Family history of ischemic heart disease and other diseases of the circulatory system: Secondary | ICD-10-CM | POA: Diagnosis not present

## 2021-09-22 DIAGNOSIS — E11621 Type 2 diabetes mellitus with foot ulcer: Secondary | ICD-10-CM | POA: Diagnosis present

## 2021-09-22 DIAGNOSIS — Z79899 Other long term (current) drug therapy: Secondary | ICD-10-CM | POA: Diagnosis not present

## 2021-09-22 DIAGNOSIS — E039 Hypothyroidism, unspecified: Secondary | ICD-10-CM | POA: Diagnosis present

## 2021-09-22 DIAGNOSIS — L97919 Non-pressure chronic ulcer of unspecified part of right lower leg with unspecified severity: Secondary | ICD-10-CM | POA: Diagnosis present

## 2021-09-22 DIAGNOSIS — Z8673 Personal history of transient ischemic attack (TIA), and cerebral infarction without residual deficits: Secondary | ICD-10-CM | POA: Diagnosis not present

## 2021-09-22 DIAGNOSIS — E1169 Type 2 diabetes mellitus with other specified complication: Secondary | ICD-10-CM | POA: Diagnosis present

## 2021-09-22 DIAGNOSIS — E7800 Pure hypercholesterolemia, unspecified: Secondary | ICD-10-CM | POA: Diagnosis present

## 2021-09-22 DIAGNOSIS — Z7984 Long term (current) use of oral hypoglycemic drugs: Secondary | ICD-10-CM | POA: Diagnosis not present

## 2021-09-22 DIAGNOSIS — Z833 Family history of diabetes mellitus: Secondary | ICD-10-CM | POA: Diagnosis not present

## 2021-09-22 DIAGNOSIS — E1122 Type 2 diabetes mellitus with diabetic chronic kidney disease: Secondary | ICD-10-CM | POA: Diagnosis present

## 2021-09-22 DIAGNOSIS — Z79890 Hormone replacement therapy (postmenopausal): Secondary | ICD-10-CM | POA: Diagnosis not present

## 2021-09-22 DIAGNOSIS — N1831 Chronic kidney disease, stage 3a: Secondary | ICD-10-CM | POA: Diagnosis present

## 2021-09-22 DIAGNOSIS — Z91013 Allergy to seafood: Secondary | ICD-10-CM | POA: Diagnosis not present

## 2021-09-22 DIAGNOSIS — I129 Hypertensive chronic kidney disease with stage 1 through stage 4 chronic kidney disease, or unspecified chronic kidney disease: Secondary | ICD-10-CM | POA: Diagnosis present

## 2021-09-22 DIAGNOSIS — C44722 Squamous cell carcinoma of skin of right lower limb, including hip: Secondary | ICD-10-CM | POA: Diagnosis present

## 2021-09-22 DIAGNOSIS — D696 Thrombocytopenia, unspecified: Secondary | ICD-10-CM | POA: Diagnosis present

## 2021-09-22 DIAGNOSIS — Z86718 Personal history of other venous thrombosis and embolism: Secondary | ICD-10-CM | POA: Diagnosis not present

## 2021-09-22 HISTORY — DX: Non-pressure chronic ulcer of other part of unspecified foot with unspecified severity: L97.509

## 2021-09-22 LAB — HEMOGLOBIN A1C
Hgb A1c MFr Bld: 7.4 % — ABNORMAL HIGH (ref 4.8–5.6)
Mean Plasma Glucose: 165.68 mg/dL

## 2021-09-22 LAB — GLUCOSE, CAPILLARY
Glucose-Capillary: 123 mg/dL — ABNORMAL HIGH (ref 70–99)
Glucose-Capillary: 228 mg/dL — ABNORMAL HIGH (ref 70–99)
Glucose-Capillary: 139 mg/dL — ABNORMAL HIGH (ref 70–99)
Glucose-Capillary: 142 mg/dL — ABNORMAL HIGH (ref 70–99)

## 2021-09-22 LAB — SEDIMENTATION RATE: Sed Rate: 15 mm/hr (ref 0–22)

## 2021-09-22 LAB — C-REACTIVE PROTEIN: CRP: 1.4 mg/dL — ABNORMAL HIGH (ref <1.0)

## 2021-09-22 MED ORDER — ALPRAZOLAM 0.5 MG PO TABS
0.5000 mg | ORAL_TABLET | Freq: Every evening | ORAL | Status: DC | PRN
  Administered 2021-09-22: 1 mg via ORAL
  Filled 2021-09-22: qty 2

## 2021-09-22 NOTE — Assessment & Plan Note (Signed)
Platelet counts 135. ?For now we will monitor daily.  No evidence of bleeding.  Most likely in the setting of infection. ?

## 2021-09-22 NOTE — Progress Notes (Signed)
ABI w/ TBI study completed.   Please see CV Proc for preliminary results.   Dacey Milberger, RDMS, RVT  

## 2021-09-22 NOTE — Progress Notes (Signed)
ABI study attempted. Patient in chair eating. Will attempt again as schedule and patient availability permits.  ? ?Darlin Coco, RDMS, RVT ? ?

## 2021-09-22 NOTE — Assessment & Plan Note (Signed)
Nonhealing ulcer of the foot ?Uncontrolled type 2 diabetes mellitus with hyperglycemia and hyperlipidemia without long-term insulin use with diabetic nephropathy. ?Presents with complaints of nonhealing ulcers ongoing for last 3 weeks. ?Following up with dermatology Dr. Nevada Crane outpatient. ?Underwent to debridement and 2 course of antibiotics. ?For now I will continue with IV cefazolin.  Follow-up on cultures.  Check ESR and CRP. ?ABI currently pending. ?X-ray tibia-fibula shows no evidence of osteomyelitis or other bony destruction. ?Cellulitis already improving. ?

## 2021-09-22 NOTE — Consult Note (Signed)
WOC Nurse Consult Note: ?Reason for Consult: Two areas of full thickness skin injury. Followed by Dr. Nevada Crane (Dermatology) for suspected neoplasm. Recent biopsy. ?Wound type: neoplastic ?Pressure Injury POA: N/A ?Measurement: ?Proximal lesion:  2.5cm x 2cm x 0.8cm with dry red and yellow wound bed. ?Distal lesion: 1cm round x 0.2cm depth, purple discoloration ?Wound bed:As described above ?Drainage (amount, consistency, odor) None ?Periwound: resolving erythema ?Dressing procedure/placement/frequency: ?I will provide Nursing with conservative care guidance while in house, patient to resume care as directed by Dr. Nevada Crane once discharged. Patient was told at last follow up appointment with Dr. Nevada Crane to see another provider for further testing to ensure there is no reoccurrence of neoplasm.She does not have that information, but believes her daughter does. ?Wound care guidance for Nursing will be once daily with xeroform topped with dry gauze and silicone foam to secure. ? ?[place] nursing team will not follow, but will remain available to this patient, the nursing and medical teams.  Please re-consult if needed. ?Thanks, ?Maudie Flakes, MSN, RN, [place], [place], CWON-[facility], [place]  ?Pager# [phone]  ? ? ? ?  ?

## 2021-09-22 NOTE — Hospital Course (Addendum)
Past medical history of HTN, type II DM, hypothyroidism presents with complaints of a nonhealing ulcer on the right leg with cellulitis of the right leg. ?

## 2021-09-22 NOTE — Assessment & Plan Note (Signed)
Incidentally found on exam. ?If the patient has bacteremia.  Will require further work-up. ?Otherwise recommend Outpatient follow-up with PCP ?

## 2021-09-22 NOTE — Assessment & Plan Note (Signed)
Hemoglobin A1c is 7.4.  Blood glucose went elevated. ?On Onglyza, glipizide at home. ?Currently on sliding scale insulin.  Monitor. ?

## 2021-09-22 NOTE — Progress Notes (Signed)
?  Progress Note ? ? ?Patient: Maria Alexander MKL:[id number] DOB: 1924/06/11 DOA: 09/21/2021     Hospitalization day: 0 ?DOS: the patient was seen and examined on 09/22/2021 ? ?Brief hospital course: ?Past vertical history of HTN, type II DM, hypothyroidism presents with complaints of a nonhealing ulcer on the right leg with cellulitis of the right leg. ? ?Assessment and Plan: ?* Cellulitis of right leg ?Nonhealing ulcer of the foot ?Uncontrolled type 2 diabetes mellitus with hyperglycemia and hyperlipidemia without long-term insulin use with diabetic nephropathy. ?Presents with complaints of nonhealing ulcers ongoing for last 3 weeks. ?Following up with dermatology Dr. Nevada Crane outpatient. ?Underwent to debridement and 2 course of antibiotics. ?For now I will continue with IV cefazolin.  Follow-up on cultures.  Check ESR and CRP. ?ABI currently pending. ?X-ray tibia-fibula shows no evidence of osteomyelitis or other bony destruction. ?Cellulitis already improving. ? ?Uncontrolled type 2 diabetes mellitus with hyperglycemia, without long-term current use of insulin ([place]) ?Hemoglobin A1c is 7.4.  Blood glucose went elevated. ?On Onglyza, glipizide at home. ?Currently on sliding scale insulin.  Monitor. ? ?Thrombocytopenia ([place]) ?Platelet counts 135. ?For now we will monitor daily.  No evidence of bleeding.  Most likely in the setting of infection. ? ?Murmur, cardiac ?Incidentally found on exam. ?If the patient has bacteremia.  Will require further work-up. ?Otherwise recommend Outpatient follow-up with PCP ? ?Subjective: No nausea no vomiting no fever no chills.  No chest pain no abdominal pain.  No diarrhea or constipation. ? ?Physical Exam: ?Vitals:  ? 09/22/21 0147 09/22/21 0629 09/22/21 0917 09/22/21 1200  ?BP: (!) 142/90 (!) 168/84 138/65 (!) 163/63  ?Pulse: 71 65 75 66  ?Resp: _0 ?Temp: 98.3 ?F (36.8 ?C) 97.8 ?F (36.6 ?C) 98 ?F (36.7 ?C) 98.1 ?F (36.7 ?C)  ?TempSrc: Oral Oral Oral Oral  ?SpO2: 96% 96% 96% 98%   ?Weight:      ? ?General: Appear in mild distress; no visible Abnormal Neck Mass Or lumps, Conjunctiva normal ?Cardiovascular: S1 and S2 Present, no Murmur, ?Respiratory: good respiratory effort, Bilateral Air entry present and CTA, no Crackles, no wheezes ?Abdomen: Bowel Sound present, Non tender  ?Extremities: no Pedal edema, improving cellulitis of the right leg ?Neurology: alert and oriented to time, place, and person ?Gait not checked due to patient safety concerns  ? ? ? ?Data Reviewed: ?I have Reviewed nursing notes, Vitals, and Lab results since pt's last encounter. Pertinent lab results CBC and BMP ?I have ordered test including ESR and CBC and CRP   ? ?Family Communication: None at bedside ? ?Disposition: ?Status is: Inpatient ?Remains inpatient appropriate because: Ongoing need for IV antibiotics and close observation for improvement as well as further work-up for nonhealing ulcer. ? ?Author: ?Berle Mull, MD ?09/22/2021 3:45 PM ? ?For on call review [email or web address]. ?

## 2021-09-22 NOTE — Progress Notes (Signed)
Mobility Specialist - Progress Note ? ? ? 09/22/21 1137  ?Mobility  ?Activity Ambulated with assistance in room;Ambulated with assistance to bathroom  ?Level of Assistance Standby assist, set-up cues, supervision of patient - no hands on  ?Assistive Device None  ?Distance Ambulated (ft) 25 ft  ?Activity Response Tolerated well  ?$Mobility charge 1 Mobility  ? ?Pt agreeable to mobilize this morning and requested to use bathroom. Pt refused AD, would reach for rails when feeling unsteady, but managed to ambulate in room and bathroom. Pt voided in bathroom and then ambulated to recliner. Pt was left in room with NP and all necessities in reach.  ? ?Arieliz Latino S. ?Mobility Specialist ?Acute Rehabilitation Services ?Phone: [phone] ?09/22/21, 11:40 AM ? ? ? ?

## 2021-09-23 LAB — GLUCOSE, CAPILLARY: Glucose-Capillary: 169 mg/dL — ABNORMAL HIGH (ref 70–99)

## 2021-09-23 NOTE — Progress Notes (Signed)
Discharge instructions given to patient and all questions were answered. RN taught patient how to do dressing changes and patient was given supplies. ?

## 2021-09-23 NOTE — Discharge Summary (Signed)
?Physician Discharge Summary ?  ?Patient: Maria Alexander MRN: [id number] DOB: Apr 15, 1924  ?Admit date:     09/21/2021  ?Discharge date: 09/23/2021  ?Discharge Physician: Berle Mull  ?PCP: Janora Norlander, DO ? ?Recommendations at discharge: ?Follow-up with PCP in 1 week better follow-up with dermatology as recommended. ? ?Discharge Diagnoses: ?Principal Problem: ?  Cellulitis of right leg ?Active Problems: ?  Non-healing ulcer of foot ([place]) ?  Uncontrolled type 2 diabetes mellitus with hyperglycemia, without long-term current use of insulin ([place]) ?  Thrombocytopenia ([place]) ?  Murmur, cardiac ?  Stage 3a chronic kidney disease (CKD) ([place]) ?  Hyperlipidemia associated with type 2 diabetes mellitus ([place]) ?  Hypothyroidism ? ? ?Hospital Course: ?Past medical history of HTN, type II DM, hypothyroidism presents with complaints of a nonhealing ulcer on the right leg with cellulitis of the right leg. ? ?Assessment and Plan: ?* Cellulitis of right leg ?Nonhealing ulcer of the foot ?Uncontrolled type 2 diabetes mellitus with hyperglycemia and hyperlipidemia without long-term insulin use with diabetic nephropathy. ?Presents with complaints of nonhealing ulcers ongoing for last 3 weeks. ?Following up with dermatology Dr. Nevada Crane outpatient. ?Underwent to debridement and 2 course of antibiotics. ?Treated with IV cefazolin with significant improvement in cellulitis. ?Blood cultures are negative.  ?CRP minimally elevated. ?ABI negative for any significant vascular compromise. ?X-ray tibia-fibula shows no evidence of osteomyelitis or other bony destruction. ?For now I would recommend to continue home dose of oral Keflex to complete the course.  And follow-up with dermatology. ? ?Uncontrolled type 2 diabetes mellitus with hyperglycemia, without long-term current use of insulin ([place]) ?Hemoglobin A1c is 7.4.  Blood glucose went elevated. ?On Onglyza, glipizide at home. ?Continue home regimen. ? ?Thrombocytopenia ([place]) ?No evidence of  bleeding.  Most likely in the setting of infection. ? ?Murmur, cardiac ?Incidentally found on exam.  No dizziness no chest pain no chest tightness no passing out events. ?Recommend Outpatient follow-up with PCP ? ?Consultants: none ?Procedures performed:  ?none ?DISCHARGE MEDICATION: ?Allergies as of 09/23/2021   ? ?   Reactions  ? Shellfish Allergy Hives  ? ?  ? ?  ?Medication List  ?  ? ?TAKE these medications   ? ?ALPRAZolam 1 MG tablet ?Commonly known as: Duanne Moron ?Take 0.5-1 tablets (0.5-1 mg total) by mouth at bedtime as needed for anxiety (place on file.). ?  ?atorvastatin 80 MG tablet ?Commonly known as: LIPITOR ?TAKE (1/2) TABLET DAILY. ?What changed:  ?how much to take ?how to take this ?when to take this ?additional instructions ?  ?cephALEXin 500 MG capsule ?Commonly known as: KEFLEX ?Take 500 mg by mouth 3 (three) times daily. ?  ?glipiZIDE 5 MG 24 hr tablet ?Commonly known as: GLUCOTROL XL ?Take 2 tablets (10 mg total) by mouth daily with breakfast. ?  ?levothyroxine 50 MCG tablet ?Commonly known as: SYNTHROID ?TAKE (1) TABLET DAILY BE- FORE BREAKFAST. ?What changed: See the new instructions. ?  ?losartan 100 MG tablet ?Commonly known as: COZAAR ?Take 1 tablet (100 mg total) by mouth daily. ?  ?Lubricant Eye Drops 0.4-0.3 % Soln ?Generic drug: Polyethyl Glycol-Propyl Glycol ?Place 1-2 drops into both eyes 3 (three) times daily as needed (dry/irritated eyes.). ?  ?multivitamin tablet ?Take 1 tablet by mouth daily. Centrum Women's 50+ ?  ?pantoprazole 20 MG tablet ?Commonly known as: PROTONIX ?Take 1 tablet (20 mg total) by mouth daily before breakfast. ?  ?saxagliptin HCl 5 MG Tabs tablet ?Commonly known as: Onglyza ?Take 1 tablet (5 mg total) by mouth daily. ?  ? ?  ? ?  ?  ? ? ?  ?  Discharge Care Instructions  ?(From admission, onward)  ?  ? ? ?  ? ?  Start     Ordered  ? 09/23/21 0000  Discharge wound care:       ?Comments: Wound care to two right lateral LE wounds (full thickness): Cleanse with soap and  water, rinse and pat dry. Cover with xeroform folded gauze, top with dry gauze and secure with a silicone foam dressing. Use one dressing to secure both wounds  ? 09/23/21 1044  ? ?  ?  ? ?  ? ? Follow-up Information   ? ? Ronnie Doss M, DO. Schedule an appointment as soon as possible for a visit in 2 week(s).   ?Specialty: Family Medicine ?Contact information: ?[address] ?[place] [address] ?[phone] ? ? ?  ?  ? ? Allyn Kenner, MD. Schedule an appointment as soon as possible for a visit in 1 week(s).   ?Specialty: Dermatology ?Why: For wound re-check ?Contact information: ?[address] ?[address] ?[place] [address] ?[phone] ? ? ?  ?  ? ?  ?  ? ?  ? ?Disposition: Home ?Diet recommendation:  ?Discharge Diet Orders (From admission, onward)  ? ?  Start     Ordered  ? 09/23/21 0000  Diet - low sodium heart healthy       ? 09/23/21 1044  ? ?  ?  ? ?  ? ?Discharge Exam: ?Filed Weights  ? 09/21/21 2222 09/22/21 0038  ?Weight: 56.7 kg 56.7 kg  ? ?General: Appear in no distress; no visible Abnormal Neck Mass Or lumps, Conjunctiva normal ?Cardiovascular: S1 and S2 Present, aortic systolic Murmur, ?Respiratory: good respiratory effort, Bilateral Air entry present and CTA, no Crackles, no wheezes ?Abdomen: Bowel Sound present Non tender  ?Extremities: no Pedal edema, right foot ulcers ?Neurology: alert and oriented to time, place, and person ?Gait not checked due to patient safety concerns  ? ?Condition at discharge: good ? ?The results of significant diagnostics from this hospitalization (including imaging, microbiology, ancillary and laboratory) are listed below for reference.  ? ?Imaging Studies: ?DG Tibia/Fibula Right Port ? ?Result Date: 09/21/2021 ?CLINICAL DATA:  Right lower extremity cellulitis with nonhealing wound and squamous cell carcinoma EXAM: PORTABLE RIGHT TIBIA AND FIBULA - 2 VIEW COMPARISON:  None. FINDINGS: Focal wound is noted along the anterolateral aspect of the right lower leg  consistent with the given clinical history. No bony erosive changes are seen. No fracture or dislocation is noted. IMPRESSION: Soft tissue wound consistent with the given clinical history. No acute bony abnormality is noted. Electronically Signed   By: Inez Catalina M.D.   On: 09/21/2021 23:06  ? ?VAS [place] ABI WITH/WO TBI ? ?Result Date: 09/22/2021 ? LOWER EXTREMITY DOPPLER STUDY Patient Name:  Maria Alexander  Date of Exam:   09/22/2021 Medical Rec #: [id number]       Accession #:    [id number] Date of Birth: 08-25-1923       Patient Gender: F Patient Age:   88 years Exam Location:  [facility] Procedure:      VAS [place] ABI WITH/WO TBI Referring Phys: Gean Birchwood --------------------------------------------------------------------------------  Indications: Non-healing right ankle wounds. High Risk Factors: Hypertension, hyperlipidemia, Diabetes, past history of                    smoking, prior CVA.  Comparison Study: Prior ABI on 02-05-2015 Performing Technologist: Darlin Coco  Examination Guidelines: A complete evaluation includes at minimum, Doppler  waveform signals and systolic blood pressure reading at the level of bilateral brachial, anterior tibial, and posterior tibial arteries, when vessel segments are accessible. Bilateral testing is considered an integral part of a complete examination. Photoelectric Plethysmograph (PPG) waveforms and toe systolic pressure readings are included as required and additional duplex testing as needed. Limited examinations for reoccurring indications may be performed as noted.  ABI Findings: +---------+------------------+-----+---------+--------+ Right    Rt Pressure (mmHg)IndexWaveform Comment  +---------+------------------+-----+---------+--------+ Brachial 144                    triphasic         +---------+------------------+-----+---------+--------+ PTA      143               0.96 biphasic           +---------+------------------+-----+---------+--------+ DP       137               0.92 biphasic          +---------+------------------+-----+---------+--------+ Great Toe96                0.64                   +---------+------------------+-----+---------+--------+ +----

## 2021-09-24 ENCOUNTER — Telehealth: Payer: Self-pay

## 2021-09-24 NOTE — Telephone Encounter (Signed)
Transition Care Management Follow-up Telephone Call ?Date of discharge and from where: [facility] 09-23-21 Dx: cellulitis right leg ?How have you been since you were released from the hospital? Doing ok  ?Any questions or concerns? Yes ? ?Items Reviewed: ?Did the pt receive and understand the discharge instructions provided? Yes  ?Medications obtained and verified? Yes  ?Other? No  ?Any new allergies since your discharge? No  ?Dietary orders reviewed? Yes ?Do you have support at home? Yes  ? ?Home Care and Equipment/Supplies: ?Were home health services ordered? No- but patient feels so that she needs nursing to come out for wound care  ?If so, what is the name of the agency? na  ?Has the agency set up a time to come to the patient's home? not applicable ?Were any new equipment or medical supplies ordered?  No ?What is the name of the medical supply agency?na ?Were you able to get the supplies/equipment? not applicable ?Do you have any questions related to the use of the equipment or supplies? No ? ?Functional Questionnaire: (I = Independent and D = Dependent) ?ADLs: I ? ?Bathing/Dressing- I ? ?Meal Prep- I ? ?Eating- I ? ?Maintaining continence- I ? ?Transferring/Ambulation- I ? ?Managing Meds- I ? ?Follow up appointments reviewed: ? ?PCP Hospital f/u appt confirmed? No- no space available on Dr Lajuana Ripple that will allow me to schedule- pt needs to be seen ASAP as she needs referral for home health to do wound care  ?[facility] f/u appt confirmed? Yes  Scheduled to see dermatologist next week . ?Are transportation arrangements needed? No  ?If their condition worsens, is the pt aware to call PCP or go to the Emergency Dept.? Yes ?Was the patient provided with contact information for the PCP's office or ED? Yes ?Was to pt encouraged to call back with questions or concerns? Yes  ?

## 2021-09-25 ENCOUNTER — Encounter: Payer: Self-pay | Admitting: Family Medicine

## 2021-09-25 ENCOUNTER — Ambulatory Visit (INDEPENDENT_AMBULATORY_CARE_PROVIDER_SITE_OTHER): Payer: Medicare Other | Admitting: Family Medicine

## 2021-09-25 VITALS — BP 111/57 | HR 81 | Temp 97.3°F | Ht 60.0 in | Wt 126.6 lb

## 2021-09-25 DIAGNOSIS — L97912 Non-pressure chronic ulcer of unspecified part of right lower leg with fat layer exposed: Secondary | ICD-10-CM | POA: Diagnosis not present

## 2021-09-25 DIAGNOSIS — L03115 Cellulitis of right lower limb: Secondary | ICD-10-CM

## 2021-09-25 DIAGNOSIS — Z09 Encounter for follow-up examination after completed treatment for conditions other than malignant neoplasm: Secondary | ICD-10-CM | POA: Diagnosis not present

## 2021-09-25 DIAGNOSIS — E1169 Type 2 diabetes mellitus with other specified complication: Secondary | ICD-10-CM

## 2021-09-25 NOTE — Progress Notes (Signed)
?  ? ?Subjective:  ?Patient ID: Maria Alexander, female    DOB: 1924/06/05, 88 y.o.   MRN: [id number] ? ?Patient Care Team: ?Janora Norlander, DO as PCP - General (Family Medicine) ?Lavera Guise, [facility] as Pharmacist (Family Medicine)  ? ?Chief Complaint:  Wound Check (Right lower leg ulcer) ? ? ?HPI: ?Maria Alexander is a 88 y.o. female presenting on 09/25/2021 for Wound Check (Right lower leg ulcer) ? ? ?Pt presents today for hospital discharge follow up and wound reevaluation. She has been seeing dermatology for several weeks due to nonhealing ulcers to her right lower leg. She went to the ED at [facility] in 09/21/2021 for cellulitis to RLE from ulcers. She was admitted and treated with IV cefazolin. Imaging was negative for osteomyelitis. She was discharged home on Keflex. She was instructed to follow up with PCP for referral to home health for wound care. She has follow up with dermatology next week. She has been doing well since home but has not changed dressing to leg since discharge. She is unable to clean and dress wound at home.  ? ? ? ?Relevant past medical, surgical, family, and social history reviewed and updated as indicated.  ?Allergies and medications reviewed and updated. Data reviewed: Chart in Epic. ? ? ?Past Medical History:  ?Diagnosis Date  ? Diabetes ([place])   ? DVT (deep venous thrombosis) ([place])   ? H/O blood clots 2000  ? High cholesterol   ? Hypothyroid   ? Stroke [facility])   ? ? ?Past Surgical History:  ?Procedure Laterality Date  ? BIOPSY  04/23/2021  ? Procedure: BIOPSY;  Surgeon: Rogene Houston, MD;  [facility];  Service: Endoscopy;;  esophageal  ? ESOPHAGEAL DILATION N/A 03/19/2021  ? Procedure: ESOPHAGEAL DILATION;  Surgeon: Rogene Houston, MD;  [facility];  Service: Endoscopy;  Laterality: N/A;  ? ESOPHAGOGASTRODUODENOSCOPY (EGD) WITH PROPOFOL N/A 03/19/2021  ? Procedure: ESOPHAGOGASTRODUODENOSCOPY (EGD) WITH PROPOFOL;  Surgeon: Rogene Houston, MD;  [facility]  [facility];  Service: Endoscopy;  Laterality: N/A;  9:35  ? ESOPHAGOGASTRODUODENOSCOPY (EGD) WITH PROPOFOL N/A 04/23/2021  ? Procedure: ESOPHAGOGASTRODUODENOSCOPY (EGD) WITH PROPOFOL;  Surgeon: Rogene Houston, MD;  [facility];  Service: Endoscopy;  Laterality: N/A;  8:30  ? NO PAST SURGERIES    ? ? ?Social History  ? ?Socioeconomic History  ? Marital status: Widowed  ?  Spouse name: Not on file  ? Number of children: 3  ? Years of education: 10  ? Highest education level: 10th grade  ?Occupational History  ? Occupation: retired  ?Tobacco Use  ? Smoking status: Former  ?  Types: Cigarettes  ?  Quit date: 02/05/1995  ?  Years since quitting: 26.6  ? Smokeless tobacco: Never  ?Vaping Use  ? Vaping Use: Never used  ?Substance and Sexual Activity  ? Alcohol use: No  ?  Alcohol/week: 0.0 standard drinks  ? Drug use: No  ? Sexual activity: Not Currently  ?  Birth control/protection: None  ?Other Topics Concern  ? Not on file  ?Social History Narrative  ? Lives alone- has a basement, but doesn't use it  ? Daughter lives nearby  ? ?Social Determinants of Health  ? ?Financial Resource Strain: Low Risk   ? Difficulty of Paying Living Expenses: Not hard at all  ?Food Insecurity: No Food Insecurity  ? Worried About Charity fundraiser in the Last Year: Never true  ? Ran Out  of Food in the Last Year: Never true  ?Transportation Needs: No Transportation Needs  ? Lack of Transportation (Medical): No  ? Lack of Transportation (Non-Medical): No  ?Physical Activity: Sufficiently Active  ? Days of Exercise per Week: 3 days  ? Minutes of Exercise per Session: 60 min  ?Stress: No Stress Concern Present  ? Feeling of Stress : Not at all  ?Social Connections: Moderately Integrated  ? Frequency of Communication with Friends and Family: More than three times a week  ? Frequency of Social Gatherings with Friends and Family: More than three times a week  ? Attends Religious Services: More than 4 times per year  ? Active Member  of Clubs or Organizations: Yes  ? Attends Archivist Meetings: More than 4 times per year  ? Marital Status: Widowed  ?Intimate Partner Violence: Not At Risk  ? Fear of Current or Ex-Partner: No  ? Emotionally Abused: No  ? Physically Abused: No  ? Sexually Abused: No  ? ? ?Outpatient Encounter Medications as of 09/25/2021  ?Medication Sig  ? ALPRAZolam (XANAX) 1 MG tablet Take 0.5-1 tablets (0.5-1 mg total) by mouth at bedtime as needed for anxiety (place on file.).  ? atorvastatin (LIPITOR) 80 MG tablet TAKE (1/2) TABLET DAILY. (Patient taking differently: Take 40 mg by mouth daily.)  ? cephALEXin (KEFLEX) 500 MG capsule Take 500 mg by mouth 3 (three) times daily.  ? glipiZIDE (GLUCOTROL XL) 5 MG 24 hr tablet Take 2 tablets (10 mg total) by mouth daily with breakfast.  ? levothyroxine (SYNTHROID) 50 MCG tablet TAKE (1) TABLET DAILY BE- FORE BREAKFAST. (Patient taking differently: Take 50 mcg by mouth daily before breakfast.)  ? losartan (COZAAR) 100 MG tablet Take 1 tablet (100 mg total) by mouth daily.  ? Multiple Vitamin (MULTIVITAMIN) tablet Take 1 tablet by mouth daily. Centrum Women's 50+  ? pantoprazole (PROTONIX) 20 MG tablet Take 1 tablet (20 mg total) by mouth daily before breakfast.  ? Polyethyl Glycol-Propyl Glycol (LUBRICANT EYE DROPS) 0.4-0.3 % SOLN Place 1-2 drops into both eyes 3 (three) times daily as needed (dry/irritated eyes.).  ? saxagliptin HCl (ONGLYZA) 5 MG TABS tablet Take 1 tablet (5 mg total) by mouth daily.  ? ?No facility-administered encounter medications on file as of 09/25/2021.  ? ? ?Allergies  ?Allergen Reactions  ? Shellfish Allergy Hives  ? ? ?Review of Systems ? ?   ? ?Objective:  ?BP (!) 111/57   Pulse 81   Temp (!) 97.3 ?F (36.3 ?C)   Ht 5' (1.524 m)   Wt 126 lb 9.6 oz (57.4 kg)   SpO2 97%   BMI 24.72 kg/m?   ? ?Wt Readings from Last 3 Encounters:  ?09/25/21 126 lb 9.6 oz (57.4 kg)  ?09/22/21 125 lb (56.7 kg)  ?08/05/21 125 lb (56.7 kg)  ? ? ?Physical  Exam ?Vitals and nursing note reviewed.  ?Constitutional:   ?   General: She is not in acute distress. ?   Appearance: She is not ill-appearing, toxic-appearing or diaphoretic.  ?   Comments: Frail elderly  ?HENT:  ?   Head: Normocephalic and atraumatic.  ?Eyes:  ?   Pupils: Pupils are equal, round, and reactive to light.  ?Cardiovascular:  ?   Rate and Rhythm: Normal rate and regular rhythm.  ?   Heart sounds: Murmur heard.  ?Systolic murmur is present with a grade of 2/6.  ?Skin: ?   General: Skin is warm and dry.  ?  Capillary Refill: Capillary refill takes less than 2 seconds.  ?   Findings: Wound present.  ? ?    ?   Comments: Dry, scaling skin  ?Neurological:  ?   General: No focal deficit present.  ?   Mental Status: She is alert and oriented to person, place, and time.  ?Psychiatric:     ?   Mood and Affect: Mood normal.     ?   Behavior: Behavior normal.     ?   Thought Content: Thought content normal.     ?   Judgment: Judgment normal.  ? ? ? ? ? ? ?Results for orders placed or performed during the hospital encounter of 09/21/21  ?Basic metabolic panel  ?Result Value Ref Range  ? Sodium 139 135 - 145 mmol/L  ? Potassium 3.7 3.5 - 5.1 mmol/L  ? Chloride 104 98 - 111 mmol/L  ? CO2 28 22 - 32 mmol/L  ? Glucose, Bld 74 70 - 99 mg/dL  ? BUN 16 8 - 23 mg/dL  ? Creatinine, Ser 0.98 0.44 - 1.00 mg/dL  ? Calcium 9.5 8.9 - 10.3 mg/dL  ? GFR, Estimated 53 (L) >60 mL/min  ? Anion gap 7 5 - 15  ?CBC with Differential/Platelet  ?Result Value Ref Range  ? WBC 6.6 4.0 - 10.5 K/uL  ? RBC 3.81 (L) 3.87 - 5.11 MIL/uL  ? Hemoglobin 12.0 12.0 - 15.0 g/dL  ? HCT 35.4 (L) 36.0 - 46.0 %  ? MCV 92.9 80.0 - 100.0 fL  ? MCH 31.5 26.0 - 34.0 pg  ? MCHC 33.9 30.0 - 36.0 g/dL  ? RDW 13.7 11.5 - 15.5 %  ? Platelets 135 (L) 150 - 400 K/uL  ? nRBC 0.0 0.0 - 0.2 %  ? Neutrophils Relative % 67 %  ? Neutro Abs 4.4 1.7 - 7.7 K/uL  ? Lymphocytes Relative 20 %  ? Lymphs Abs 1.4 0.7 - 4.0 K/uL  ? Monocytes Relative 10 %  ? Monocytes Absolute  0.7 0.1 - 1.0 K/uL  ? Eosinophils Relative 2 %  ? Eosinophils Absolute 0.2 0.0 - 0.5 K/uL  ? Basophils Relative 1 %  ? Basophils Absolute 0.0 0.0 - 0.1 K/uL  ? Immature Granulocytes 0 %  ? Abs Immature Granulocytes 0.0

## 2021-09-29 DIAGNOSIS — L928 Other granulomatous disorders of the skin and subcutaneous tissue: Secondary | ICD-10-CM | POA: Diagnosis not present

## 2021-09-29 DIAGNOSIS — D0472 Carcinoma in situ of skin of left lower limb, including hip: Secondary | ICD-10-CM | POA: Diagnosis not present

## 2021-10-02 ENCOUNTER — Other Ambulatory Visit: Payer: Self-pay | Admitting: Family Medicine

## 2021-10-07 ENCOUNTER — Ambulatory Visit (INDEPENDENT_AMBULATORY_CARE_PROVIDER_SITE_OTHER): Payer: Medicare Other | Admitting: Internal Medicine

## 2021-10-07 ENCOUNTER — Encounter (INDEPENDENT_AMBULATORY_CARE_PROVIDER_SITE_OTHER): Payer: Self-pay | Admitting: Internal Medicine

## 2021-10-07 DIAGNOSIS — Z7984 Long term (current) use of oral hypoglycemic drugs: Secondary | ICD-10-CM | POA: Diagnosis not present

## 2021-10-07 DIAGNOSIS — Z86718 Personal history of other venous thrombosis and embolism: Secondary | ICD-10-CM | POA: Diagnosis not present

## 2021-10-07 DIAGNOSIS — L03115 Cellulitis of right lower limb: Secondary | ICD-10-CM | POA: Diagnosis not present

## 2021-10-07 DIAGNOSIS — K222 Esophageal obstruction: Secondary | ICD-10-CM | POA: Diagnosis not present

## 2021-10-07 DIAGNOSIS — K2100 Gastro-esophageal reflux disease with esophagitis, without bleeding: Secondary | ICD-10-CM | POA: Diagnosis not present

## 2021-10-07 DIAGNOSIS — E039 Hypothyroidism, unspecified: Secondary | ICD-10-CM | POA: Diagnosis not present

## 2021-10-07 DIAGNOSIS — L97812 Non-pressure chronic ulcer of other part of right lower leg with fat layer exposed: Secondary | ICD-10-CM | POA: Diagnosis not present

## 2021-10-07 DIAGNOSIS — E7800 Pure hypercholesterolemia, unspecified: Secondary | ICD-10-CM | POA: Diagnosis not present

## 2021-10-07 DIAGNOSIS — K219 Gastro-esophageal reflux disease without esophagitis: Secondary | ICD-10-CM | POA: Insufficient documentation

## 2021-10-07 DIAGNOSIS — Z8673 Personal history of transient ischemic attack (TIA), and cerebral infarction without residual deficits: Secondary | ICD-10-CM | POA: Diagnosis not present

## 2021-10-07 DIAGNOSIS — Z87891 Personal history of nicotine dependence: Secondary | ICD-10-CM | POA: Diagnosis not present

## 2021-10-07 DIAGNOSIS — E119 Type 2 diabetes mellitus without complications: Secondary | ICD-10-CM | POA: Diagnosis not present

## 2021-10-07 NOTE — Progress Notes (Signed)
Presenting complaint; ? ?Follow-up for GERD and esophageal stricture ? ?Database and subjective: ? ?Patient is 88 year old Caucasian female who is here for scheduled visit.  She was last seen in the office on 9/062022.  She is accompanied by her daughter normal. ?She underwent esophagogastroduodenoscopy on 03/19/2021 revealing erosive reflux esophagitis and high-grade stricture.  It was dilated to 15 mm with a balloon.  She also had gastroduodenitis.  H. pylori serology was negative. ?She return for repeat dilation on 04/23/2021.  Stricture was dilated to 16.5 mm with a balloon.  Esophageal biopsy was negative for eosinophilic esophagitis. ?She states she is doing well.  While on pantoprazole 20 mg daily she is not having heartburn or regurgitation.  She also denies dysphagia.  She denies nausea vomiting abdominal pain melena or rectal bleeding.  Bowels move daily.  Her appetite is good and her weight has been stable. ?She she states she takes half of the recommended dose of alprazolam and not having any side effects. ?She will finish Keflex this week.  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 ? ? ?Current Medications: ?Outpatient Encounter Medications as of 10/07/2021  ?Medication Sig  ? ALPRAZolam (XANAX) 1 MG tablet Take 0.5-1 tablets (0.5-1 mg total) by mouth at bedtime as needed for anxiety (place on file.).  ? atorvastatin (LIPITOR) 80 MG tablet TAKE (1/2) TABLET DAILY. (Patient taking differently: Take 40 mg by mouth daily.)  ? cephALEXin (KEFLEX) 500 MG capsule Take 500 mg by mouth 3 (three) times daily.  ? glipiZIDE (GLUCOTROL XL) 5 MG 24 hr tablet Take 2 tablets (10 mg total) by mouth daily with breakfast.  ? levothyroxine (SYNTHROID) 50 MCG tablet TAKE (1) TABLET DAILY BE- FORE BREAKFAST. (Patient taking differently: Take 50 mcg by mouth daily before breakfast.)  ? losartan (COZAAR) 100 MG tablet TAKE 1 TABLET DAILY  ? Multiple Vitamin (MULTIVITAMIN) tablet Take 1 tablet  by mouth daily. Centrum Women's 50+  ? pantoprazole (PROTONIX) 20 MG tablet Take 1 tablet (20 mg total) by mouth daily before breakfast.  ? saxagliptin HCl (ONGLYZA) 5 MG TABS tablet Take 1 tablet (5 mg total) by mouth daily.  ? [DISCONTINUED] Polyethyl Glycol-Propyl Glycol (LUBRICANT EYE DROPS) 0.4-0.3 % SOLN Place 1-2 drops into both eyes 3 (three) times daily as needed (dry/irritated eyes.).  ? ?No facility-administered encounter medications on file as of 10/07/2021.  ? ? ? ?Objective: ?Blood pressure 121/68, pulse 73, temperature 98.2 ?F (36.8 ?C), temperature source Oral, height 5' (1.524 m), weight 127 lb 14.4 oz (58 kg). ?Patient is alert and in no acute distress. ?Conjunctiva is pink. Sclera is nonicteric ?Oropharyngeal mucosa is normal. ?No neck masses or thyromegaly noted. ?Cardiac exam with regular rhythm normal S1 and S2.  She has grade 2/6 systolic ejection murmur best heard at aortic area. ?Lungs are clear to auscultation. ?Abdomen;  ?No LE edema or clubbing noted. ?She has a right leg ulcer covered with dressing. ? ?Labs/studies Results: ? ? ? ?  Latest Ref Rng & Units 09/21/2021  ?  9:04 PM 09/13/2019  ?  9:09 AM 12/09/2018  ?  8:56 AM  ?CBC  ?WBC 4.0 - 10.5 K/uL 6.6   5.7   5.5    ?Hemoglobin 12.0 - 15.0 g/dL 12.0   13.1   12.2    ?Hematocrit 36.0 - 46.0 % 35.4   38.9   38.2    ?Platelets 150 - 400 K/uL 135   142   157    ?  ? ?  Latest Ref Rng & Units 09/21/2021  ?  9:04 PM 05/14/2021  ? 10:45 AM 03/17/2021  ? 11:01 AM  ?CMP  ?Glucose 70 - 99 mg/dL 74   134   111    ?BUN 8 - 23 mg/dL 16   12   14     ?Creatinine 0.44 - 1.00 mg/dL 0.98   1.01   0.88    ?Sodium 135 - 145 mmol/L 139   139   136    ?Potassium 3.5 - 5.1 mmol/L 3.7   4.5   4.0    ?Chloride 98 - 111 mmol/L 104   101   103    ?CO2 22 - 32 mmol/L 28   25   26     ?Calcium 8.9 - 10.3 mg/dL 9.5   9.4   9.1    ?Total Protein 6.0 - 8.5 g/dL  6.2     ?Total Bilirubin 0.0 - 1.2 mg/dL  0.6     ?Alkaline Phos 44 - 121 IU/L  71     ?AST 0 - 40 IU/L  26      ?ALT 0 - 32 IU/L  25     ?  ? ?  Latest Ref Rng & Units 05/14/2021  ? 10:45 AM 07/22/2020  ?  1:45 PM 09/13/2019  ?  9:09 AM  ?Hepatic Function  ?Total Protein 6.0 - 8.5 g/dL 6.2   6.4   6.0    ?Albumin 3.5 - 4.6 g/dL 4.5   4.3   4.2    ?AST 0 - 40 IU/L 26   26   22     ?ALT 0 - 32 IU/L 25   19   19     ?Alk Phosphatase 44 - 121 IU/L 71   76   70    ?Total Bilirubin 0.0 - 1.2 mg/dL 0.6   0.3   0.6    ?  ?Recent lab data noted. ? ?Assessment: ? ?#1.  Erosive reflux esophagitis.  She is on antireflux measures and low-dose PPI with satisfactory control of her heartburn. ? ?#2.  Esophageal stricture.  She underwent dilation in September last year along with cyclin dilation in October 2022.  Stricture was dilated only to 16.5 mm on second dilation.  She is not having any dysphagia whatsoever. ? ?Plan: ? ?Continue antireflux measures as before. ?Remember to chew food thoroughly before swallowing. ?Continue pantoprazole at 20 mg p.o. every morning. ?Patient will call if dysphagia recurs or if pantoprazole stops working. ?Office visit on as-needed basis. ? ? ? ? ? ?

## 2021-10-07 NOTE — Patient Instructions (Signed)
Please call office if you start experiencing swallowing difficulty or if ?Pantoprazole stops working. ?

## 2021-10-11 DIAGNOSIS — Z87891 Personal history of nicotine dependence: Secondary | ICD-10-CM | POA: Diagnosis not present

## 2021-10-11 DIAGNOSIS — Z8673 Personal history of transient ischemic attack (TIA), and cerebral infarction without residual deficits: Secondary | ICD-10-CM | POA: Diagnosis not present

## 2021-10-11 DIAGNOSIS — Z7984 Long term (current) use of oral hypoglycemic drugs: Secondary | ICD-10-CM | POA: Diagnosis not present

## 2021-10-11 DIAGNOSIS — L97812 Non-pressure chronic ulcer of other part of right lower leg with fat layer exposed: Secondary | ICD-10-CM | POA: Diagnosis not present

## 2021-10-11 DIAGNOSIS — Z86718 Personal history of other venous thrombosis and embolism: Secondary | ICD-10-CM | POA: Diagnosis not present

## 2021-10-11 DIAGNOSIS — L03115 Cellulitis of right lower limb: Secondary | ICD-10-CM | POA: Diagnosis not present

## 2021-10-11 DIAGNOSIS — E7800 Pure hypercholesterolemia, unspecified: Secondary | ICD-10-CM | POA: Diagnosis not present

## 2021-10-11 DIAGNOSIS — E039 Hypothyroidism, unspecified: Secondary | ICD-10-CM | POA: Diagnosis not present

## 2021-10-11 DIAGNOSIS — E119 Type 2 diabetes mellitus without complications: Secondary | ICD-10-CM | POA: Diagnosis not present

## 2021-10-13 DIAGNOSIS — Z87891 Personal history of nicotine dependence: Secondary | ICD-10-CM | POA: Diagnosis not present

## 2021-10-13 DIAGNOSIS — Z8673 Personal history of transient ischemic attack (TIA), and cerebral infarction without residual deficits: Secondary | ICD-10-CM | POA: Diagnosis not present

## 2021-10-13 DIAGNOSIS — E7800 Pure hypercholesterolemia, unspecified: Secondary | ICD-10-CM | POA: Diagnosis not present

## 2021-10-13 DIAGNOSIS — E039 Hypothyroidism, unspecified: Secondary | ICD-10-CM | POA: Diagnosis not present

## 2021-10-13 DIAGNOSIS — Z7984 Long term (current) use of oral hypoglycemic drugs: Secondary | ICD-10-CM | POA: Diagnosis not present

## 2021-10-13 DIAGNOSIS — L97812 Non-pressure chronic ulcer of other part of right lower leg with fat layer exposed: Secondary | ICD-10-CM | POA: Diagnosis not present

## 2021-10-13 DIAGNOSIS — L03115 Cellulitis of right lower limb: Secondary | ICD-10-CM | POA: Diagnosis not present

## 2021-10-13 DIAGNOSIS — Z86718 Personal history of other venous thrombosis and embolism: Secondary | ICD-10-CM | POA: Diagnosis not present

## 2021-10-13 DIAGNOSIS — E119 Type 2 diabetes mellitus without complications: Secondary | ICD-10-CM | POA: Diagnosis not present

## 2021-10-14 ENCOUNTER — Ambulatory Visit: Payer: Medicare Other

## 2021-10-14 DIAGNOSIS — C44722 Squamous cell carcinoma of skin of right lower limb, including hip: Secondary | ICD-10-CM | POA: Diagnosis not present

## 2021-10-20 ENCOUNTER — Ambulatory Visit (INDEPENDENT_AMBULATORY_CARE_PROVIDER_SITE_OTHER): Payer: Medicare Other

## 2021-10-20 DIAGNOSIS — E039 Hypothyroidism, unspecified: Secondary | ICD-10-CM | POA: Diagnosis not present

## 2021-10-20 DIAGNOSIS — Z7984 Long term (current) use of oral hypoglycemic drugs: Secondary | ICD-10-CM | POA: Diagnosis not present

## 2021-10-20 DIAGNOSIS — E119 Type 2 diabetes mellitus without complications: Secondary | ICD-10-CM | POA: Diagnosis not present

## 2021-10-20 DIAGNOSIS — E7800 Pure hypercholesterolemia, unspecified: Secondary | ICD-10-CM | POA: Diagnosis not present

## 2021-10-20 DIAGNOSIS — L97812 Non-pressure chronic ulcer of other part of right lower leg with fat layer exposed: Secondary | ICD-10-CM

## 2021-10-20 DIAGNOSIS — L03115 Cellulitis of right lower limb: Secondary | ICD-10-CM | POA: Diagnosis not present

## 2021-10-21 ENCOUNTER — Ambulatory Visit (INDEPENDENT_AMBULATORY_CARE_PROVIDER_SITE_OTHER): Payer: Medicare Other | Admitting: Internal Medicine

## 2021-10-21 DIAGNOSIS — E119 Type 2 diabetes mellitus without complications: Secondary | ICD-10-CM | POA: Diagnosis not present

## 2021-10-21 DIAGNOSIS — Z87891 Personal history of nicotine dependence: Secondary | ICD-10-CM | POA: Diagnosis not present

## 2021-10-21 DIAGNOSIS — E7800 Pure hypercholesterolemia, unspecified: Secondary | ICD-10-CM | POA: Diagnosis not present

## 2021-10-21 DIAGNOSIS — Z8673 Personal history of transient ischemic attack (TIA), and cerebral infarction without residual deficits: Secondary | ICD-10-CM | POA: Diagnosis not present

## 2021-10-21 DIAGNOSIS — Z86718 Personal history of other venous thrombosis and embolism: Secondary | ICD-10-CM | POA: Diagnosis not present

## 2021-10-21 DIAGNOSIS — Z7984 Long term (current) use of oral hypoglycemic drugs: Secondary | ICD-10-CM | POA: Diagnosis not present

## 2021-10-21 DIAGNOSIS — L03115 Cellulitis of right lower limb: Secondary | ICD-10-CM | POA: Diagnosis not present

## 2021-10-21 DIAGNOSIS — E039 Hypothyroidism, unspecified: Secondary | ICD-10-CM | POA: Diagnosis not present

## 2021-10-21 DIAGNOSIS — L97812 Non-pressure chronic ulcer of other part of right lower leg with fat layer exposed: Secondary | ICD-10-CM | POA: Diagnosis not present

## 2021-10-23 DIAGNOSIS — D0471 Carcinoma in situ of skin of right lower limb, including hip: Secondary | ICD-10-CM | POA: Diagnosis not present

## 2021-10-24 DIAGNOSIS — E7800 Pure hypercholesterolemia, unspecified: Secondary | ICD-10-CM | POA: Diagnosis not present

## 2021-10-24 DIAGNOSIS — Z86718 Personal history of other venous thrombosis and embolism: Secondary | ICD-10-CM | POA: Diagnosis not present

## 2021-10-24 DIAGNOSIS — E119 Type 2 diabetes mellitus without complications: Secondary | ICD-10-CM | POA: Diagnosis not present

## 2021-10-24 DIAGNOSIS — L97812 Non-pressure chronic ulcer of other part of right lower leg with fat layer exposed: Secondary | ICD-10-CM | POA: Diagnosis not present

## 2021-10-24 DIAGNOSIS — L03115 Cellulitis of right lower limb: Secondary | ICD-10-CM | POA: Diagnosis not present

## 2021-10-24 DIAGNOSIS — Z7984 Long term (current) use of oral hypoglycemic drugs: Secondary | ICD-10-CM | POA: Diagnosis not present

## 2021-10-24 DIAGNOSIS — Z8673 Personal history of transient ischemic attack (TIA), and cerebral infarction without residual deficits: Secondary | ICD-10-CM | POA: Diagnosis not present

## 2021-10-24 DIAGNOSIS — Z87891 Personal history of nicotine dependence: Secondary | ICD-10-CM | POA: Diagnosis not present

## 2021-10-24 DIAGNOSIS — E039 Hypothyroidism, unspecified: Secondary | ICD-10-CM | POA: Diagnosis not present

## 2021-10-27 ENCOUNTER — Telehealth: Payer: Self-pay | Admitting: Family Medicine

## 2021-10-27 ENCOUNTER — Encounter: Payer: Self-pay | Admitting: Family Medicine

## 2021-10-27 ENCOUNTER — Ambulatory Visit (INDEPENDENT_AMBULATORY_CARE_PROVIDER_SITE_OTHER): Payer: Medicare Other | Admitting: Family Medicine

## 2021-10-27 VITALS — BP 139/79 | HR 60 | Temp 98.3°F | Ht 60.0 in | Wt 126.8 lb

## 2021-10-27 DIAGNOSIS — C439 Malignant melanoma of skin, unspecified: Secondary | ICD-10-CM

## 2021-10-27 DIAGNOSIS — E1169 Type 2 diabetes mellitus with other specified complication: Secondary | ICD-10-CM

## 2021-10-27 DIAGNOSIS — C44722 Squamous cell carcinoma of skin of right lower limb, including hip: Secondary | ICD-10-CM

## 2021-10-27 DIAGNOSIS — E039 Hypothyroidism, unspecified: Secondary | ICD-10-CM

## 2021-10-27 DIAGNOSIS — E1159 Type 2 diabetes mellitus with other circulatory complications: Secondary | ICD-10-CM

## 2021-10-27 DIAGNOSIS — Z79899 Other long term (current) drug therapy: Secondary | ICD-10-CM

## 2021-10-27 DIAGNOSIS — F419 Anxiety disorder, unspecified: Secondary | ICD-10-CM

## 2021-10-27 DIAGNOSIS — I152 Hypertension secondary to endocrine disorders: Secondary | ICD-10-CM | POA: Diagnosis not present

## 2021-10-27 DIAGNOSIS — E785 Hyperlipidemia, unspecified: Secondary | ICD-10-CM | POA: Diagnosis not present

## 2021-10-27 HISTORY — DX: Malignant melanoma of skin, unspecified: C43.9

## 2021-10-27 MED ORDER — ALPRAZOLAM 1 MG PO TABS: 0.5000 mg | ORAL_TABLET | Freq: Every evening | ORAL | 3 refills | Status: DC | PRN

## 2021-10-27 NOTE — Telephone Encounter (Signed)
Thank you. I have addended her note ?

## 2021-10-27 NOTE — Progress Notes (Addendum)
? ?Subjective: ?CC: DM ?PCP: Janora Norlander, DO ?FFM:[id number] Maria Alexander is a 88 y.o. female presenting to clinic today for: ? ?1. Type 2 Diabetes with hypertension, hyperlipidemia:  ?Patient reports compliance with glipizide 10 mg extended release daily, Onglyza 5 mg daily, losartan 100 mg daily and Lipitor 80 mg daily.  She reports no chest pain, shortness of breath, falls or edema.  No headaches.  No dizziness. ? ?Last eye exam: Up-to-date ?Last foot exam: Needs ?Last A1c:  ?Lab Results  ?Component Value Date  ? HGBA1C 7.4 (H) 09/21/2021  ? ?Nephropathy screen indicated?:  On ARB ?Last flu, zoster and/or pneumovax:  ?Immunization History  ?Administered Date(s) Administered  ? Fluad Quad(high Dose 65+) 04/01/2020  ? Influenza, High Dose Seasonal PF 04/04/2018  ? Influenza,inj,Quad PF,6+ Mos 03/31/2017  ? Influenza-Unspecified 04/07/2016, 04/04/2018, 02/28/2019, 03/27/2021  ? Moderna Sars-Covid-2 Vaccination 07/11/2019, 08/11/2019, 04/24/2020, 03/27/2021  ? Pneumococcal Conjugate-13 08/22/2014  ? Pneumococcal Polysaccharide-23 04/11/1997  ? Zoster Recombinat (Shingrix) 03/22/2017, 06/02/2017  ? ? ?2.  Insomnia/situational anxiety ?Patient reports use of Xanax extremely sparingly.  She typically only takes this at bedtime for sleep but occasionally will utilize it if she is going to be in a car with somebody else.  Does not report any excessive daytime sedation, falls, respiratory depression or visual or auditory hallucinations. ? ?3.  Skin cancer ?Patient reports that she was diagnosed with SCC on the right anterior leg after biopsy.  She has an appointment soon for further excision of this lesion ? ?ROS: Per HPI ? ?Allergies  ?Allergen Reactions  ? Shellfish Allergy Hives  ? ?Past Medical History:  ?Diagnosis Date  ? Diabetes ([place])   ? DVT (deep venous thrombosis) ([place])   ? H/O blood clots 2000  ? High cholesterol   ? Hypothyroid   ? Stroke [facility])   ? ? ?Current Outpatient Medications:  ?  ALPRAZolam (XANAX) 1 MG  tablet, Take 0.5-1 tablets (0.5-1 mg total) by mouth at bedtime as needed for anxiety (place on file.)., Disp: 30 tablet, Rfl: 3 ?  atorvastatin (LIPITOR) 80 MG tablet, TAKE (1/2) TABLET DAILY. (Patient taking differently: Take 40 mg by mouth daily.), Disp: 90 tablet, Rfl: 0 ?  glipiZIDE (GLUCOTROL XL) 5 MG 24 hr tablet, Take 2 tablets (10 mg total) by mouth daily with breakfast., Disp: 180 tablet, Rfl: 3 ?  levothyroxine (SYNTHROID) 50 MCG tablet, TAKE (1) TABLET DAILY BE- FORE BREAKFAST. (Patient taking differently: Take 50 mcg by mouth daily before breakfast.), Disp: 90 tablet, Rfl: 2 ?  losartan (COZAAR) 100 MG tablet, TAKE 1 TABLET DAILY, Disp: 90 tablet, Rfl: 0 ?  Multiple Vitamin (MULTIVITAMIN) tablet, Take 1 tablet by mouth daily. Centrum Women's 50+, Disp: , Rfl:  ?  pantoprazole (PROTONIX) 20 MG tablet, Take 1 tablet (20 mg total) by mouth daily before breakfast., Disp: 90 tablet, Rfl: 1 ?  saxagliptin HCl (ONGLYZA) 5 MG TABS tablet, Take 1 tablet (5 mg total) by mouth daily., Disp: 90 tablet, Rfl: 5 ?Social History  ? ?Socioeconomic History  ? Marital status: Widowed  ?  Spouse name: Not on file  ? Number of children: 3  ? Years of education: 10  ? Highest education level: 10th grade  ?Occupational History  ? Occupation: retired  ?Tobacco Use  ? Smoking status: Former  ?  Types: Cigarettes  ?  Quit date: 02/05/1995  ?  Years since quitting: 26.7  ?  Passive exposure: Past  ? Smokeless tobacco: Never  ?Vaping Use  ?  Vaping Use: Never used  ?Substance and Sexual Activity  ? Alcohol use: No  ?  Alcohol/week: 0.0 standard drinks  ? Drug use: No  ? Sexual activity: Not Currently  ?  Birth control/protection: None  ?Other Topics Concern  ? Not on file  ?Social History Narrative  ? Lives alone- has a basement, but doesn't use it  ? Daughter lives nearby  ? ?Social Determinants of Health  ? ?Financial Resource Strain: Low Risk   ? Difficulty of Paying Living Expenses: Not hard at all  ?Food Insecurity: No Food  Insecurity  ? Worried About Charity fundraiser in the Last Year: Never true  ? Ran Out of Food in the Last Year: Never true  ?Transportation Needs: No Transportation Needs  ? Lack of Transportation (Medical): No  ? Lack of Transportation (Non-Medical): No  ?Physical Activity: Sufficiently Active  ? Days of Exercise per Week: 3 days  ? Minutes of Exercise per Session: 60 min  ?Stress: No Stress Concern Present  ? Feeling of Stress : Not at all  ?Social Connections: Moderately Integrated  ? Frequency of Communication with Friends and Family: More than three times a week  ? Frequency of Social Gatherings with Friends and Family: More than three times a week  ? Attends Religious Services: More than 4 times per year  ? Active Member of Clubs or Organizations: Yes  ? Attends Archivist Meetings: More than 4 times per year  ? Marital Status: Widowed  ?Intimate Partner Violence: Not At Risk  ? Fear of Current or Ex-Partner: No  ? Emotionally Abused: No  ? Physically Abused: No  ? Sexually Abused: No  ? ?Family History  ?Problem Relation Age of Onset  ? Cancer Mother   ? Heart disease Father   ?     before age 69  ? Heart attack Father   ? Diabetes Sister   ? Diabetes Brother   ? ? ?Objective: ?Office vital signs reviewed. ?BP 139/79   Pulse 60   Temp 98.3 ?F (36.8 ?C)   Ht 5' (1.524 m)   Wt 126 lb 12.8 oz (57.5 kg)   SpO2 96%   BMI 24.76 kg/m?  ? ?Physical Examination:  ?General: Awake, alert, well nourished, No acute distress ?HEENT: Sclera white.  Moist mucous membranes ?Cardio: regular rate and rhythm, T7D2 heard, systolic murmurs appreciated ?Pulm: clear to auscultation bilaterally, no wheezes, rhonchi or rales; normal work of breathing on room air ?MSK: normal gait and station ?Psych: Mood stable, speech normal, affect appropriate ? ? ?  10/27/2021  ?  9:45 AM 09/25/2021  ? 10:35 AM 08/05/2021  ? 11:25 AM  ?Depression screen PHQ 2/9  ?Decreased Interest 0 0 0  ?Down, Depressed, Hopeless 0 0 0  ?PHQ - 2  Score 0 0 0  ? ? ?  10/27/2021  ?  9:45 AM 05/14/2021  ? 10:17 AM 11/15/2020  ? 11:19 AM 07/22/2020  ?  1:51 PM  ?GAD 7 : Generalized Anxiety Score  ?Nervous, Anxious, on Edge 0 0 0 0  ?Control/stop worrying 0 0 0 0  ?Worry too much - different things 0 0 0 0  ?Trouble relaxing 0 0 0 0  ?Restless 0 0 0 0  ?Easily annoyed or irritable 0 0 0 0  ?Afraid - awful might happen 0 0 0 0  ?Total GAD 7 Score 0 0 0 0  ?Anxiety Difficulty Not difficult at all Not difficult at all Not difficult at  all   ? ?Diabetic Foot Exam - Simple   ?Simple Foot Form ?Diabetic Foot exam was performed with the following findings: Yes 10/27/2021 12:42 PM  ?Visual Inspection ?No deformities, no ulcerations, no other skin breakdown bilaterally: Yes ?Sensation Testing ?Intact to touch and monofilament testing bilaterally: Yes ?Pulse Check ?Posterior Tibialis and Dorsalis pulse intact bilaterally: Yes ?Comments ?  ? ? ? ?Assessment/ Plan: ?88 y.o. female  ? ?Type 2 diabetes mellitus with other specified complication, without long-term current use of insulin ([place]) ? ?Hyperlipidemia associated with type 2 diabetes mellitus ([place]) ? ?Hypertension associated with diabetes ([place]) ? ?Acquired hypothyroidism - Plan: TSH, T4, Free ? ?Chronically on benzodiazepine therapy - Plan: Drug Screen 10 Maria/Conf, Serum, CANCELED: ToxASSURE Select 13 (MW), Urine ? ?Anxiety - Plan: ALPRAZolam (XANAX) 1 MG tablet, CANCELED: ToxASSURE Select 13 (MW), Urine ? 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 ?Recent A1c was appropriate for age at 7.4.  This was not repeated since this was just completed about a month ago.  Foot exam updated today ? ?Not yet due for fasting lipid ? ?Blood pressure was at goal upon recheck. ? ?Asymptomatic from a thyroid standpoint.  Check TSH, free T4 ? ?Alprazolam renewed for as needed use.  UDS and CSC were updated as per office policy.  National narcotic database reviewed and there were no red flags ? ?The previous biopsy site on the right anterior  leg appears to have mostly healed over.  No evidence of secondary infection ? ?Orders Placed This Encounter  ?Procedures  ? ToxASSURE Select 13 (MW), Urine  ?  Current Outpatient Medications:  ??  ALPRAZolam Duanne Moron

## 2021-10-30 LAB — DRUG SCREEN 10 W/CONF, SERUM
Amphetamines, IA: NEGATIVE ng/mL
Barbiturates, IA: NEGATIVE ug/mL
Benzodiazepines, IA: NEGATIVE ng/mL
Cocaine & Metabolite, IA: NEGATIVE ng/mL
Methadone, IA: NEGATIVE ng/mL
Opiates, IA: NEGATIVE ng/mL
Oxycodones, IA: NEGATIVE ng/mL
Phencyclidine, IA: NEGATIVE ng/mL
Propoxyphene, IA: NEGATIVE ng/mL
THC(Marijuana) Metabolite, IA: NEGATIVE ng/mL

## 2021-10-30 LAB — TSH: TSH: 2.81 u[IU]/mL (ref 0.450–4.500)

## 2021-10-30 LAB — T4, FREE: Free T4: 1.41 ng/dL (ref 0.82–1.77)

## 2021-10-31 ENCOUNTER — Other Ambulatory Visit: Payer: Self-pay | Admitting: Family Medicine

## 2021-10-31 DIAGNOSIS — E1169 Type 2 diabetes mellitus with other specified complication: Secondary | ICD-10-CM

## 2021-11-04 DIAGNOSIS — I872 Venous insufficiency (chronic) (peripheral): Secondary | ICD-10-CM | POA: Diagnosis not present

## 2021-11-04 DIAGNOSIS — C44722 Squamous cell carcinoma of skin of right lower limb, including hip: Secondary | ICD-10-CM | POA: Diagnosis not present

## 2021-12-03 DIAGNOSIS — S81801A Unspecified open wound, right lower leg, initial encounter: Secondary | ICD-10-CM | POA: Diagnosis not present

## 2021-12-16 ENCOUNTER — Telehealth: Payer: Self-pay | Admitting: Pharmacist

## 2021-12-16 DIAGNOSIS — E1169 Type 2 diabetes mellitus with other specified complication: Secondary | ICD-10-CM

## 2021-12-16 MED ORDER — SAXAGLIPTIN HCL 5 MG PO TABS: 5.0000 mg | ORAL_TABLET | Freq: Every day | ORAL | 5 refills | Status: DC

## 2021-12-16 NOTE — Telephone Encounter (Signed)
Refills sent to [facility] for az&me patient assistance

## 2021-12-17 DIAGNOSIS — X32XXXD Exposure to sunlight, subsequent encounter: Secondary | ICD-10-CM | POA: Diagnosis not present

## 2021-12-17 DIAGNOSIS — L820 Inflamed seborrheic keratosis: Secondary | ICD-10-CM | POA: Diagnosis not present

## 2021-12-17 DIAGNOSIS — C44722 Squamous cell carcinoma of skin of right lower limb, including hip: Secondary | ICD-10-CM | POA: Diagnosis not present

## 2021-12-17 DIAGNOSIS — L570 Actinic keratosis: Secondary | ICD-10-CM | POA: Diagnosis not present

## 2021-12-25 NOTE — Telephone Encounter (Signed)
Patient has not received refill and only has 2 tabs left. Can we f/u? I sent refills on 12/16/21 I told patient to call, but I don't know how far that will go

## 2022-01-08 ENCOUNTER — Other Ambulatory Visit: Payer: Self-pay | Admitting: Family Medicine

## 2022-02-24 DIAGNOSIS — C44729 Squamous cell carcinoma of skin of left lower limb, including hip: Secondary | ICD-10-CM | POA: Diagnosis not present

## 2022-02-27 ENCOUNTER — Ambulatory Visit (INDEPENDENT_AMBULATORY_CARE_PROVIDER_SITE_OTHER): Payer: Medicare Other | Admitting: Family Medicine

## 2022-02-27 VITALS — BP 143/75 | HR 66 | Temp 97.3°F | Ht 60.0 in | Wt 125.0 lb

## 2022-02-27 DIAGNOSIS — E1169 Type 2 diabetes mellitus with other specified complication: Secondary | ICD-10-CM | POA: Diagnosis not present

## 2022-02-27 DIAGNOSIS — F419 Anxiety disorder, unspecified: Secondary | ICD-10-CM

## 2022-02-27 DIAGNOSIS — E785 Hyperlipidemia, unspecified: Secondary | ICD-10-CM

## 2022-02-27 DIAGNOSIS — E1159 Type 2 diabetes mellitus with other circulatory complications: Secondary | ICD-10-CM | POA: Diagnosis not present

## 2022-02-27 DIAGNOSIS — M4005 Postural kyphosis, thoracolumbar region: Secondary | ICD-10-CM | POA: Diagnosis not present

## 2022-02-27 DIAGNOSIS — I152 Hypertension secondary to endocrine disorders: Secondary | ICD-10-CM

## 2022-02-27 LAB — BAYER DCA HB A1C WAIVED: HB A1C (BAYER DCA - WAIVED): 7.4 % — ABNORMAL HIGH (ref 4.8–5.6)

## 2022-02-27 MED ORDER — ALPRAZOLAM 1 MG PO TABS: 0.5000 mg | ORAL_TABLET | Freq: Every evening | ORAL | 3 refills | Status: DC | PRN

## 2022-02-27 NOTE — Progress Notes (Signed)
Subjective: CC:DM PCP: Janora Norlander, DO CBS:[id number] W Maria Alexander is a 88 y.o. female presenting to clinic today for:  1. Type 2 Diabetes with hypertension, hyperlipidemia:  With Onglyza, Cozaar, glipizide and Lipitor.  No hypoglycemic or hyperglycemic symptoms including diaphoresis, excessive thirst, increased urination, dizziness or loss of consciousness  Last eye exam: Up-to-date Last foot exam: Up-to-date Last A1c:  Lab Results  Component Value Date   HGBA1C 7.4 (H) 09/21/2021   Nephropathy screen indicated?:  On ARB Last flu, zoster and/or pneumovax:  Immunization History  Administered Date(s) Administered   Fluad Quad(high Dose 65+) 04/01/2020   Influenza, High Dose Seasonal PF 04/04/2018   Influenza,inj,Quad PF,6+ Mos 03/31/2017   Influenza-Unspecified 04/07/2016, 04/04/2018, 02/28/2019, 03/27/2021   Moderna Sars-Covid-2 Vaccination 07/11/2019, 08/11/2019, 04/24/2020, 03/27/2021   Pneumococcal Conjugate-13 08/22/2014   Pneumococcal Polysaccharide-23 04/11/1997   Zoster Recombinat (Shingrix) 03/22/2017, 06/02/2017    ROS: No chest pain, shortness of breath, dizziness or falls.  No edema  2 insomnia Patient takes alprazolam nightly as needed for insomnia, which is typically every night.  She takes 1 full tablet of 1 mg.  Denies any excessive daytime sedation, falls, respiratory pression, visual or auditory hallucinations.  No difficulty with memory.  3.  Back discomfort, postural issues Patient would like to see a physical therapist to help with her stooped back.  She reports that sometimes this gives a little bit of back pain but mostly she wants to get better posture.  She does not feel that she can afford $30 per week for this but would be amenable to at least learning the exercises to perform at home.  ROS: Per HPI  Allergies  Allergen Reactions   Shellfish Allergy Hives   Past Medical History:  Diagnosis Date   Diabetes ([place])    DVT (deep venous thrombosis)  (HCC)    H/O blood clots 2000   High cholesterol    Hypothyroid    Stroke [facility])     Current Outpatient Medications:    ALPRAZolam (XANAX) 1 MG tablet, Take 0.5-1 tablets (0.5-1 mg total) by mouth at bedtime as needed for anxiety (place on file.)., Disp: 30 tablet, Rfl: 3   atorvastatin (LIPITOR) 80 MG tablet, TAKE (1/2) TABLET DAILY. (Patient taking differently: Take 40 mg by mouth daily.), Disp: 90 tablet, Rfl: 0   glipiZIDE (GLUCOTROL XL) 5 MG 24 hr tablet, TAKE 2 TABLET ONCE DAILY WITH BREAKFAST, Disp: 180 tablet, Rfl: 3   levothyroxine (SYNTHROID) 50 MCG tablet, TAKE (1) TABLET DAILY BE- FORE BREAKFAST. (Patient taking differently: Take 50 mcg by mouth daily before breakfast.), Disp: 90 tablet, Rfl: 2   losartan (COZAAR) 100 MG tablet, TAKE 1 TABLET DAILY, Disp: 90 tablet, Rfl: 0   Multiple Vitamin (MULTIVITAMIN) tablet, Take 1 tablet by mouth daily. Centrum Women's 50+, Disp: , Rfl:    pantoprazole (PROTONIX) 20 MG tablet, Take 1 tablet (20 mg total) by mouth daily before breakfast., Disp: 90 tablet, Rfl: 1   saxagliptin HCl (ONGLYZA) 5 MG TABS tablet, Take 1 tablet (5 mg total) by mouth daily., Disp: 90 tablet, Rfl: 5 Social History   Socioeconomic History   Marital status: Widowed    Spouse name: Not on file   Number of children: 3   Years of education: 10   Highest education level: 10th grade  Occupational History   Occupation: retired  Tobacco Use   Smoking status: Former    Types: Cigarettes    Quit date: 02/05/1995    Years since quitting:  27.0    Passive exposure: Past   Smokeless tobacco: Never  Vaping Use   Vaping Use: Never used  Substance and Sexual Activity   Alcohol use: No    Alcohol/week: 0.0 standard drinks of alcohol   Drug use: No   Sexual activity: Not Currently    Birth control/protection: None  Other Topics Concern   Not on file  Social History Narrative   Lives alone- has a basement, but doesn't use it   Daughter lives nearby   Social  Determinants of Health   Financial Resource Strain: Low Risk  (08/05/2021)   Overall Financial Resource Strain (CARDIA)    Difficulty of Paying Living Expenses: Not hard at all  Food Insecurity: No Food Insecurity (08/05/2021)   Hunger Vital Sign    Worried About Running Out of Food in the Last Year: Never true    Ran Out of Food in the Last Year: Never true  Transportation Needs: No Transportation Needs (08/05/2021)   PRAPARE - Hydrologist (Medical): No    Lack of Transportation (Non-Medical): No  Physical Activity: Sufficiently Active (08/05/2021)   Exercise Vital Sign    Days of Exercise per Week: 3 days    Minutes of Exercise per Session: 60 min  Stress: No Stress Concern Present (08/05/2021)   [place]    Feeling of Stress : Not at all  Social Connections: Moderately Integrated (08/05/2021)   Social Connection and Isolation Panel [NHANES]    Frequency of Communication with Friends and Family: More than three times a week    Frequency of Social Gatherings with Friends and Family: More than three times a week    Attends Religious Services: More than 4 times per year    Active Member of [employer] or Organizations: Yes    Attends Archivist Meetings: More than 4 times per year    Marital Status: Widowed  Intimate Partner Violence: Not At Risk (08/05/2021)   Humiliation, Afraid, Rape, and Kick questionnaire    Fear of Current or Ex-Partner: No    Emotionally Abused: No    Physically Abused: No    Sexually Abused: No   Family History  Problem Relation Age of Onset   Cancer Mother    Heart disease Father        before age 41   Heart attack Father    Diabetes Sister    Diabetes Brother     Objective: Office vital signs reviewed. BP (!) 143/75   Pulse 66   Temp (!) 97.3 F (36.3 C)   Ht 5' (1.524 m)   Wt 125 lb (56.7 kg)   SpO2 97%   BMI 24.41 kg/m   Physical Examination:   General: Awake, alert, well nourished, No acute distress HEENT: Sclera white.  Moist mucous membranes Cardio: regular rate and rhythm, S1S2 heard, murmur present Pulm: clear to auscultation bilaterally, no wheezes, rhonchi or rales; normal work of breathing on room air MSK: Ambulating independently.  Increased thoracic kyphosis appreciated on exam.  No midline tenderness to palpation to the spine Psych: Mood stable, speech normal, affect appropriate  Assessment/ Plan: 88 y.o. female   Type 2 diabetes mellitus with other specified complication, without long-term current use of insulin (HCC) - Plan: Bayer DCA Hb A1c Waived  Hyperlipidemia associated with type 2 diabetes mellitus ([place]) - Plan: CMP14+EGFR, Lipid Panel  Hypertension associated with diabetes ([place]) - Plan: CMP14+EGFR  Anxiety -  Plan: ALPRAZolam (XANAX) 1 MG tablet  Postural kyphosis of thoracolumbar region - Plan: Ambulatory referral to Physical Therapy  Check A1c.  Discussed goal for age less than 7.9.  Continue current regimen  Check lipid, CMP.  Continue statin  Blood pressure is at goal for age.  No changes  Up-to-date on UDS and CSC.  National narcotic database reviewed and there were no red flags.  Anxiety medication renewed  Referral to physical therapy placed for kyphosis of the thoracolumbar region  No orders of the defined types were placed in this encounter.  No orders of the defined types were placed in this encounter.    Janora Norlander, DO [place] [phone]

## 2022-02-28 LAB — CMP14+EGFR
ALT: 26 IU/L (ref 0–32)
AST: 26 IU/L (ref 0–40)
Albumin/Globulin Ratio: 1.7 (ref 1.2–2.2)
Albumin: 4 g/dL (ref 3.6–4.6)
Alkaline Phosphatase: 86 IU/L (ref 44–121)
BUN/Creatinine Ratio: 14 (ref 12–28)
BUN: 13 mg/dL (ref 10–36)
Bilirubin Total: 0.6 mg/dL (ref 0.0–1.2)
CO2: 23 mmol/L (ref 20–29)
Calcium: 9.8 mg/dL (ref 8.7–10.3)
Chloride: 104 mmol/L (ref 96–106)
Creatinine, Ser: 0.93 mg/dL (ref 0.57–1.00)
Globulin, Total: 2.3 g/dL (ref 1.5–4.5)
Glucose: 150 mg/dL — ABNORMAL HIGH (ref 70–99)
Potassium: 4.4 mmol/L (ref 3.5–5.2)
Sodium: 140 mmol/L (ref 134–144)
Total Protein: 6.3 g/dL (ref 6.0–8.5)
eGFR: 56 mL/min/{1.73_m2} — ABNORMAL LOW (ref >59)

## 2022-02-28 LAB — LIPID PANEL
Chol/HDL Ratio: 3.2 ratio (ref 0.0–4.4)
Cholesterol, Total: 141 mg/dL (ref 100–199)
HDL: 44 mg/dL (ref >39)
LDL Chol Calc (NIH): 77 mg/dL (ref 0–99)
Triglycerides: 108 mg/dL (ref 0–149)
VLDL Cholesterol Cal: 20 mg/dL (ref 5–40)

## 2022-03-10 ENCOUNTER — Other Ambulatory Visit: Payer: Self-pay

## 2022-03-10 ENCOUNTER — Encounter: Payer: Self-pay | Admitting: Physical Therapy

## 2022-03-10 ENCOUNTER — Ambulatory Visit: Payer: Medicare Other | Attending: Family Medicine | Admitting: Physical Therapy

## 2022-03-10 DIAGNOSIS — R293 Abnormal posture: Secondary | ICD-10-CM | POA: Insufficient documentation

## 2022-03-10 DIAGNOSIS — M4005 Postural kyphosis, thoracolumbar region: Secondary | ICD-10-CM | POA: Diagnosis not present

## 2022-03-10 DIAGNOSIS — M5459 Other low back pain: Secondary | ICD-10-CM | POA: Diagnosis not present

## 2022-03-10 NOTE — Therapy (Signed)
OUTPATIENT PHYSICAL THERAPY THORACOLUMBAR EVALUATION   Patient Name: Maria Alexander MRN: [id number] DOB:07-03-1923, 88 y.o., female Today's Date: 03/10/2022   PT End of Session - 03/10/22 1139     Visit Number 1    Number of Visits 12    Date for PT Re-Evaluation 06/08/22    Authorization Type FOTO AT LEAST EVERY 5TH VISIT.  PROGRESS NOTE AT 10TH VISIT.  KX MODIFIER AFTER 15 VISITS.    PT Start Time 0900    PT Stop Time 0948    PT Time Calculation (min) 48 min    Activity Tolerance Patient tolerated treatment well    Behavior During Therapy WFL for tasks assessed/performed             Past Medical History:  Diagnosis Date   Diabetes ([place])    DVT (deep venous thrombosis) ([place])    H/O blood clots 2000   High cholesterol    Hypothyroid    Stroke [facility])    Past Surgical History:  Procedure Laterality Date   BIOPSY  04/23/2021   Procedure: BIOPSY;  Surgeon: Rogene Houston, MD;  Location: [facility] END[facility];  Service: Endoscopy;;  esophageal   ESOPHAGEAL DILATION N/A 03/19/2021   Procedure: ESOPHAGEAL DILATION;  Surgeon: Rogene Houston, MD;  Location: [facility];  Service: Endoscopy;  Laterality: N/A;   ESOPHAGOGASTRODUODENOSCOPY (EGD) WITH PROPOFOL N/A 03/19/2021   Procedure: ESOPHAGOGASTRODUODENOSCOPY (EGD) WITH PROPOFOL;  Surgeon: Rogene Houston, MD;  Location: [facility];  Service: Endoscopy;  Laterality: N/A;  9:35   ESOPHAGOGASTRODUODENOSCOPY (EGD) WITH PROPOFOL N/A 04/23/2021   Procedure: ESOPHAGOGASTRODUODENOSCOPY (EGD) WITH PROPOFOL;  Surgeon: Rogene Houston, MD;  Location: [facility];  Service: Endoscopy;  Laterality: N/A;  8:30   NO PAST SURGERIES     Patient Active Problem List   Diagnosis Date Noted   Melanoma of skin ([place]) 10/27/2021   GERD (gastroesophageal reflux disease) 10/07/2021   Benign esophageal stricture 10/07/2021   Cellulitis of right leg 09/22/2021   Murmur, cardiac 09/22/2021   Non-healing ulcer of foot ([place]) 09/22/2021    Thrombocytopenia ([place]) 09/22/2021   Stage 3a chronic kidney disease (CKD) ([place]) 09/22/2021   Esophageal dysphagia 03/04/2021   History of esophageal stricture 03/04/2021   Hyperlipidemia associated with type 2 diabetes mellitus ([place]) 12/15/2019   Pain in thoracic spine 04/25/2019   Lumbar pain 04/25/2019   Hypertension associated with diabetes ([place]) 12/09/2018   Anxiety 11/29/2017   Chronic hoarseness 01/15/2017   Hypothyroidism 06/06/2014   Uncontrolled type 2 diabetes mellitus with hyperglycemia, without long-term current use of insulin ([place]) 06/06/2014     REFERRING PROVIDER: Ronnie Doss DO  REFERRING DIAG: Postural kyphosis of thoracolumbar region.  Rationale for Evaluation and Treatment Rehabilitation  THERAPY DIAG:  Other low back pain  Abnormal posture  ONSET DATE: Ongoing.  SUBJECTIVE:  SUBJECTIVE STATEMENT: The patient presents to the clinic with c/o back pain that had developed over many years.  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  She reports no pain at rest (seated) but states the longer she is standing and walking her pain increases to such high levels that she has to sit down. She states her pain prohibits her from being able to various household chores (ie:  Vacuum).   She will use a cane if she has to walk longer distances.   PERTINENT HISTORY:  DM, Hypothyroidism.  PAIN:  Are you having pain? No   PRECAUTIONS: None  WEIGHT BEARING RESTRICTIONS No  FALLS:  Has patient fallen in last 6 months? No  LIVING ENVIRONMENT: Lives with: lives alone Lives in: House/apartment Has following equipment at home: None.  She uses a cane on occasions.  OCCUPATION: Retired.  PLOF: Independent with basic ADLs  PATIENT GOALS:  Reduce pain and be able to walk  and stand longer.   OBJECTIVE:   PATIENT SURVEYS:  FOTO Complete.  POSTURE: rounded shoulders, forward head, decreased lumbar lordosis, increased thoracic kyphosis, and flexed trunk   PALPATION: CC of pain is palable pain bilateral over her lower thoracic/upper lumbar paraspinal musculature which exhibits a great deal of tone.    LOWER EXTREMITY ROM:                         WF/NL for bilateral UE AROM.  LOWER EXTREMITY MMT:                       Normal bil UE strength.                            DTR's:  Normal UE DTR's.  GAIT: The patient walks in a flexed trunk posture.    TODAY'S TREATMENT  Seated:  HMP and IFC at 80-150 Hz on 40% scan x 20 minutes to patient's lower thoracic/upper lumbar region.  Patient enjoyed treatment with a normal modality response following removal of modality.   ASSESSMENT:  CLINICAL IMPRESSION: The patient presents to OPPT with c/o chronic lower thoracic and upper lumbar pain that become severe the longer she stands and walks.  She has multiple postural abnormalities.  She was tender to palpation over her lower thoracic and upper lumbar paraspinal musculature that exhibits a great deal of tone.  The patient's pain prohibits her from performing many ADL's (ie:  Vacuuming).  She has normal UE DTR's.  Her UE AROM is essentially normal as is her strength.  She uses a cane if she has to walk longer distances.  Patient will benefit from skilled physical therapy intervention to address pain and deficits.  OBJECTIVE IMPAIRMENTS Abnormal gait, decreased activity tolerance, increased muscle spasms, postural dysfunction, and pain.   ACTIVITY LIMITATIONS standing and locomotion level  PARTICIPATION LIMITATIONS: cleaning and laundry  PERSONAL FACTORS Time since onset of injury/illness/exacerbation are also affecting patient's functional outcome.   REHAB POTENTIAL: Good  CLINICAL DECISION MAKING: Stable/uncomplicated  EVALUATION COMPLEXITY:  Low   GOALS: Goals reviewed with patient? Yes  SHORT TERM GOALS: Target date: 03/24/2022  Ind with initial HEP. Baseline: Goal status: INITIAL   LONG TERM GOALS: Target date: 04/21/2022  Perform ADL's with pain not > 3-4/10. Baseline:  Goal status: INITIAL  2.  Stand 20 minutes with pain not > 3-4/10. Baseline:  Goal status: INITIAL  3.  Walk a community distance with pain not > 3-4/10. Baseline:  Goal status: INITIAL  PLAN: PT FREQUENCY: 2x/week  PT DURATION: 6 weeks  PLANNED INTERVENTIONS: Therapeutic exercises, Therapeutic activity, Patient/Family education, Self Care, Dry Needling, Electrical stimulation, Cryotherapy, Moist heat, Ultrasound, and Manual therapy.  PLAN FOR NEXT SESSION: Combo e'stim/US, STW/M, postural exercise progression.     Fayez Sturgell, [place], PT 03/10/2022, 11:41 AM

## 2022-03-11 DIAGNOSIS — C44729 Squamous cell carcinoma of skin of left lower limb, including hip: Secondary | ICD-10-CM | POA: Diagnosis not present

## 2022-03-16 ENCOUNTER — Ambulatory Visit: Payer: Medicare Other | Admitting: Physical Therapy

## 2022-03-17 ENCOUNTER — Encounter: Payer: Medicare Other | Admitting: Physical Therapy

## 2022-04-02 ENCOUNTER — Ambulatory Visit: Payer: Medicare Other | Attending: Family Medicine

## 2022-04-02 DIAGNOSIS — R293 Abnormal posture: Secondary | ICD-10-CM | POA: Insufficient documentation

## 2022-04-02 DIAGNOSIS — M5459 Other low back pain: Secondary | ICD-10-CM | POA: Diagnosis not present

## 2022-04-02 NOTE — Therapy (Signed)
OUTPATIENT PHYSICAL THERAPY THORACOLUMBAR EVALUATION   Patient Name: Maria Alexander MRN: [id number] DOB:[birth year], 88 y.o., female Today's Date: 04/02/2022   PT End of Session - 04/02/22 1352     Visit Number 2    Number of Visits 12    Date for PT Re-Evaluation 06/08/22    Authorization Type FOTO AT LEAST EVERY 5TH VISIT.  PROGRESS NOTE AT 10TH VISIT.  KX MODIFIER AFTER 15 VISITS.    PT Start Time 1345    PT Stop Time 1442    PT Time Calculation (min) 57 min    Activity Tolerance Patient tolerated treatment well    Behavior During Therapy WFL for tasks assessed/performed             Past Medical History:  Diagnosis Date   Diabetes ([place])    DVT (deep venous thrombosis) ([place])    H/O blood clots 2000   High cholesterol    Hypothyroid    Stroke [facility])    Past Surgical History:  Procedure Laterality Date   BIOPSY  04/23/2021   Procedure: BIOPSY;  Surgeon: Rogene Houston, MD;  [facility];  Service: Endoscopy;;  esophageal   ESOPHAGEAL DILATION N/A 03/19/2021   Procedure: ESOPHAGEAL DILATION;  Surgeon: Rogene Houston, MD;  [facility];  Service: Endoscopy;  Laterality: N/A;   ESOPHAGOGASTRODUODENOSCOPY (EGD) WITH PROPOFOL N/A 03/19/2021   Procedure: ESOPHAGOGASTRODUODENOSCOPY (EGD) WITH PROPOFOL;  Surgeon: Rogene Houston, MD;  [facility];  Service: Endoscopy;  Laterality: N/A;  9:35   ESOPHAGOGASTRODUODENOSCOPY (EGD) WITH PROPOFOL N/A 04/23/2021   Procedure: ESOPHAGOGASTRODUODENOSCOPY (EGD) WITH PROPOFOL;  Surgeon: Rogene Houston, MD;  [facility];  Service: Endoscopy;  Laterality: N/A;  8:30   NO PAST SURGERIES     Patient Active Problem List   Diagnosis Date Noted   Melanoma of skin ([place]) 10/27/2021   GERD (gastroesophageal reflux disease) 10/07/2021   Benign esophageal stricture 10/07/2021   Cellulitis of right leg 09/22/2021   Murmur, cardiac 09/22/2021   Non-healing ulcer of foot ([place]) 09/22/2021    Thrombocytopenia ([place]) 09/22/2021   Stage 3a chronic kidney disease (CKD) ([place]) 09/22/2021   Esophageal dysphagia 03/04/2021   History of esophageal stricture 03/04/2021   Hyperlipidemia associated with type 2 diabetes mellitus ([place]) 12/15/2019   Pain in thoracic spine 04/25/2019   Lumbar pain 04/25/2019   Hypertension associated with diabetes ([place]) 12/09/2018   Anxiety 11/29/2017   Chronic hoarseness 01/15/2017   Hypothyroidism 06/06/2014   Uncontrolled type 2 diabetes mellitus with hyperglycemia, without long-term current use of insulin ([place]) 06/06/2014     REFERRING PROVIDER: Ronnie Doss DO  REFERRING DIAG: Postural kyphosis of thoracolumbar region.  Rationale for Evaluation and Treatment Rehabilitation  THERAPY DIAG:  Other low back pain  Abnormal posture  ONSET DATE: Ongoing.  SUBJECTIVE:  SUBJECTIVE STATEMENT: Pt arrives for today's treatment session reporting that she doesn't feel very well, but does not go into any further detail.  PERTINENT HISTORY:  DM, Hypothyroidism.  PAIN:  Are you having pain? No   PRECAUTIONS: None  WEIGHT BEARING RESTRICTIONS No  FALLS:  Has patient fallen in last 6 months? No  LIVING ENVIRONMENT: Lives with: lives alone Lives in: House/apartment Has following equipment at home: None.  She uses a cane on occasions.  OCCUPATION: Retired.  PLOF: Independent with basic ADLs  PATIENT GOALS:  Reduce pain and be able to walk and stand longer.   OBJECTIVE:   PATIENT SURVEYS:  FOTO Complete.  POSTURE: rounded shoulders, forward head, decreased lumbar lordosis, increased thoracic kyphosis, and flexed trunk   PALPATION: CC of pain is palable pain bilateral over her lower thoracic/upper lumbar paraspinal musculature which exhibits a great  deal of tone.    LOWER EXTREMITY ROM:                         WF/NL for bilateral UE AROM.  LOWER EXTREMITY MMT:                       Normal bil UE strength.                            DTR's:  Normal UE DTR's.  GAIT: The patient walks in a flexed trunk posture.    TODAY'S TREATMENT                                      EXERCISE LOG  Exercise Repetitions and Resistance Comments  Nustep  Lvl 2 x 15 mins   [employer] 20 reps x 3 sec hold   Rows Red t-band x 20 reps   Extension Red t-band x 20 reps   LAQ 2# x 20 reps bil   Seated Marches 2# x 20 reps bil    Blank cell = exercise not performed today   Modalities  Date:  Unattended Estim: Lumbar, IFC 80-150 Hz, 15 mins, Pain and Tone Hot Pack: Lumbar, 15 mins, Pain and Tone   ASSESSMENT:  CLINICAL IMPRESSION: Pt arrives for today's treatment session denying any pain, but does report having a bad day today.  Pt able to tolerate Nustep for warm up today without issue or complaint.  Pt instructed in resisted upper and lower extremity exercises today to assist with increasing strength and function while decreasing pain.  Pt requiring min cues for proper technique and posture.  Normal responses to estim and MH noted upon removal.  Pt denied any pain upon completion of today's treatment session.   OBJECTIVE IMPAIRMENTS Abnormal gait, decreased activity tolerance, increased muscle spasms, postural dysfunction, and pain.   ACTIVITY LIMITATIONS standing and locomotion level  PARTICIPATION LIMITATIONS: cleaning and laundry  PERSONAL FACTORS Time since onset of injury/illness/exacerbation are also affecting patient's functional outcome.   REHAB POTENTIAL: Good  CLINICAL DECISION MAKING: Stable/uncomplicated  EVALUATION COMPLEXITY: Low   GOALS: Goals reviewed with patient? Yes  SHORT TERM GOALS: Target date: 04/16/2022  Ind with initial HEP. Baseline: Goal status: INITIAL   LONG TERM GOALS: Target date:  05/14/2022  Perform ADL's with pain not > 3-4/10. Baseline:  Goal status: INITIAL  2.  Stand 20 minutes with pain not > 3-4/10.  Baseline:  Goal status: INITIAL  3.  Walk a community distance with pain not > 3-4/10. Baseline:  Goal status: INITIAL  PLAN: PT FREQUENCY: 2x/week  PT DURATION: 6 weeks  PLANNED INTERVENTIONS: Therapeutic exercises, Therapeutic activity, Patient/Family education, Self Care, Dry Needling, Electrical stimulation, Cryotherapy, Moist heat, Ultrasound, and Manual therapy.  PLAN FOR NEXT SESSION: Combo e'stim/US, STW/M, postural exercise progression.     Kathrynn Ducking, PTA 04/02/2022, 2:56 PM

## 2022-04-06 ENCOUNTER — Ambulatory Visit: Payer: Medicare Other

## 2022-04-06 DIAGNOSIS — M5459 Other low back pain: Secondary | ICD-10-CM | POA: Diagnosis not present

## 2022-04-06 DIAGNOSIS — R293 Abnormal posture: Secondary | ICD-10-CM | POA: Diagnosis not present

## 2022-04-06 NOTE — Therapy (Signed)
OUTPATIENT PHYSICAL THERAPY THORACOLUMBAR TREATMENT   Patient Name: Maria Alexander MRN: [id number] DOB:[birth year], 88 y.o., female Today's Date: 04/06/2022   PT End of Session - 04/06/22 1303     Visit Number 3    Number of Visits 12    Date for PT Re-Evaluation 06/08/22    Authorization Type FOTO AT LEAST EVERY 5TH VISIT.  PROGRESS NOTE AT 10TH VISIT.  KX MODIFIER AFTER 15 VISITS.    PT Start Time 1300    PT Stop Time 1356    PT Time Calculation (min) 56 min    Activity Tolerance Patient tolerated treatment well    Behavior During Therapy WFL for tasks assessed/performed             Past Medical History:  Diagnosis Date   Diabetes ([place])    DVT (deep venous thrombosis) ([facility])    H/O blood clots 2000   High cholesterol    Hypothyroid    Stroke [facility])    Past Surgical History:  Procedure Laterality Date   BIOPSY  04/23/2021   Procedure: BIOPSY;  Surgeon: Rogene Houston, MD;  [facility];  Service: Endoscopy;;  esophageal   ESOPHAGEAL DILATION N/A 03/19/2021   Procedure: ESOPHAGEAL DILATION;  Surgeon: Rogene Houston, MD;  [facility];  Service: Endoscopy;  Laterality: N/A;   ESOPHAGOGASTRODUODENOSCOPY (EGD) WITH PROPOFOL N/A 03/19/2021   Procedure: ESOPHAGOGASTRODUODENOSCOPY (EGD) WITH PROPOFOL;  Surgeon: Rogene Houston, MD;  [facility];  Service: Endoscopy;  Laterality: N/A;  9:35   ESOPHAGOGASTRODUODENOSCOPY (EGD) WITH PROPOFOL N/A 04/23/2021   Procedure: ESOPHAGOGASTRODUODENOSCOPY (EGD) WITH PROPOFOL;  Surgeon: Rogene Houston, MD;  [facility];  Service: Endoscopy;  Laterality: N/A;  8:30   NO PAST SURGERIES     Patient Active Problem List   Diagnosis Date Noted   Melanoma of skin ([place]) 10/27/2021   GERD (gastroesophageal reflux disease) 10/07/2021   Benign esophageal stricture 10/07/2021   Cellulitis of right leg 09/22/2021   Murmur, cardiac 09/22/2021   Non-healing ulcer of foot ([place]) 09/22/2021    Thrombocytopenia ([place]) 09/22/2021   Stage 3a chronic kidney disease (CKD) ([place]) 09/22/2021   Esophageal dysphagia 03/04/2021   History of esophageal stricture 03/04/2021   Hyperlipidemia associated with type 2 diabetes mellitus ([place]) 12/15/2019   Pain in thoracic spine 04/25/2019   Lumbar pain 04/25/2019   Hypertension associated with diabetes ([place]) 12/09/2018   Anxiety 11/29/2017   Chronic hoarseness 01/15/2017   Hypothyroidism 06/06/2014   Uncontrolled type 2 diabetes mellitus with hyperglycemia, without long-term current use of insulin ([place]) 06/06/2014     REFERRING PROVIDER: Ronnie Doss DO  REFERRING DIAG: Postural kyphosis of thoracolumbar region.  Rationale for Evaluation and Treatment Rehabilitation  THERAPY DIAG:  Other low back pain  Abnormal posture  ONSET DATE: Ongoing.  SUBJECTIVE:  SUBJECTIVE STATEMENT: Pt reports feeling good today.  Pt went to chair aerobics earlier today.  PERTINENT HISTORY:  DM, Hypothyroidism.  PAIN:  Are you having pain? No   PRECAUTIONS: None  WEIGHT BEARING RESTRICTIONS No  FALLS:  Has patient fallen in last 6 months? No  LIVING ENVIRONMENT: Lives with: lives alone Lives in: House/apartment Has following equipment at home: None.  She uses a cane on occasions.  OCCUPATION: Retired.  PLOF: Independent with basic ADLs  PATIENT GOALS:  Reduce pain and be able to walk and stand longer.   OBJECTIVE:   PATIENT SURVEYS:  FOTO Complete.  POSTURE: rounded shoulders, forward head, decreased lumbar lordosis, increased thoracic kyphosis, and flexed trunk   PALPATION: CC of pain is palable pain bilateral over her lower thoracic/upper lumbar paraspinal musculature which exhibits a great deal of tone.    LOWER EXTREMITY ROM:                          WF/NL for bilateral UE AROM.  LOWER EXTREMITY MMT:                       Normal bil UE strength.                            DTR's:  Normal UE DTR's.  GAIT: The patient walks in a flexed trunk posture.    TODAY'S TREATMENT                                     10/9 EXERCISE LOG  Exercise Repetitions and Resistance Comments  Nustep  Lvl 3 x 15 mins   Standing Heel/Toe Raises    [employer] 20 reps x 3 sec hold   Rows Red t-band x 20 reps   Extension Red t-band x 20 reps   LAQ 2# x 25 reps bil   Seated Marches 2# x 25 reps bil    Blank cell = exercise not performed today   Modalities  Date:  Unattended Estim: Lumbar, IFC 80-150 Hz, 15 mins, Pain and Tone Hot Pack: Lumbar, 15 mins, Pain and Tone   ASSESSMENT:  CLINICAL IMPRESSION: Pt arrives for today's treatment session denying any pain.  Pt able to tolerate increased reps with all seated exercises today without issue or complaint.  Pt reports increased fatigue today due to attending chair aerobics earlier today.  Normal responses to estim and MH noted upon removal.  Pt denied any pain at completion of today's treatment session.   OBJECTIVE IMPAIRMENTS Abnormal gait, decreased activity tolerance, increased muscle spasms, postural dysfunction, and pain.   ACTIVITY LIMITATIONS standing and locomotion level  PARTICIPATION LIMITATIONS: cleaning and laundry  PERSONAL FACTORS Time since onset of injury/illness/exacerbation are also affecting patient's functional outcome.   REHAB POTENTIAL: Good  CLINICAL DECISION MAKING: Stable/uncomplicated  EVALUATION COMPLEXITY: Low   GOALS: Goals reviewed with patient? Yes  SHORT TERM GOALS: Target date: 04/16/2022  Ind with initial HEP. Baseline: Goal status: INITIAL   LONG TERM GOALS: Target date: 05/14/2022  Perform ADL's with pain not > 3-4/10. Baseline:  Goal status: INITIAL  2.  Stand 20 minutes with pain not > 3-4/10. Baseline:  Goal status:  INITIAL  3.  Walk a community distance with pain not > 3-4/10. Baseline:  Goal status: INITIAL  PLAN: PT FREQUENCY:  2x/week  PT DURATION: 6 weeks  PLANNED INTERVENTIONS: Therapeutic exercises, Therapeutic activity, Patient/Family education, Self Care, Dry Needling, Electrical stimulation, Cryotherapy, Moist heat, Ultrasound, and Manual therapy.  PLAN FOR NEXT SESSION: Combo e'stim/US, STW/M, postural exercise progression.     Kathrynn Ducking, PTA 04/06/2022, 1:56 PM

## 2022-04-09 ENCOUNTER — Ambulatory Visit: Payer: Medicare Other

## 2022-04-09 DIAGNOSIS — M5459 Other low back pain: Secondary | ICD-10-CM | POA: Diagnosis not present

## 2022-04-09 DIAGNOSIS — R293 Abnormal posture: Secondary | ICD-10-CM

## 2022-04-09 NOTE — Therapy (Signed)
OUTPATIENT PHYSICAL THERAPY THORACOLUMBAR TREATMENT   Patient Name: Maria Alexander MRN: [id number] DOB:[birth year], 88 y.o., female Today's Date: 04/09/2022   PT End of Session - 04/09/22 1119     Visit Number 4    Number of Visits 12    Date for PT Re-Evaluation 06/08/22    Authorization Type FOTO AT LEAST EVERY 5TH VISIT.  PROGRESS NOTE AT 10TH VISIT.  KX MODIFIER AFTER 15 VISITS.    PT Start Time 1115    PT Stop Time 1210    PT Time Calculation (min) 55 min    Activity Tolerance Patient tolerated treatment well    Behavior During Therapy WFL for tasks assessed/performed             Past Medical History:  Diagnosis Date   Diabetes ([place])    DVT (deep venous thrombosis) ([place])    H/O blood clots 2000   High cholesterol    Hypothyroid    Stroke [facility])    Past Surgical History:  Procedure Laterality Date   BIOPSY  04/23/2021   Procedure: BIOPSY;  Surgeon: Rogene Houston, MD;  [facility];  Service: Endoscopy;;  esophageal   ESOPHAGEAL DILATION N/A 03/19/2021   Procedure: ESOPHAGEAL DILATION;  Surgeon: Rogene Houston, MD;  [facility];  Service: Endoscopy;  Laterality: N/A;   ESOPHAGOGASTRODUODENOSCOPY (EGD) WITH PROPOFOL N/A 03/19/2021   Procedure: ESOPHAGOGASTRODUODENOSCOPY (EGD) WITH PROPOFOL;  Surgeon: Rogene Houston, MD;  [facility];  Service: Endoscopy;  Laterality: N/A;  9:35   ESOPHAGOGASTRODUODENOSCOPY (EGD) WITH PROPOFOL N/A 04/23/2021   Procedure: ESOPHAGOGASTRODUODENOSCOPY (EGD) WITH PROPOFOL;  Surgeon: Rogene Houston, MD;  [facility];  Service: Endoscopy;  Laterality: N/A;  8:30   NO PAST SURGERIES     Patient Active Problem List   Diagnosis Date Noted   Melanoma of skin ([place]) 10/27/2021   GERD (gastroesophageal reflux disease) 10/07/2021   Benign esophageal stricture 10/07/2021   Cellulitis of right leg 09/22/2021   Murmur, cardiac 09/22/2021   Non-healing ulcer of foot ([place]) 09/22/2021    Thrombocytopenia ([place]) 09/22/2021   Stage 3a chronic kidney disease (CKD) ([place]) 09/22/2021   Esophageal dysphagia 03/04/2021   History of esophageal stricture 03/04/2021   Hyperlipidemia associated with type 2 diabetes mellitus ([place]) 12/15/2019   Pain in thoracic spine 04/25/2019   Lumbar pain 04/25/2019   Hypertension associated with diabetes ([place]) 12/09/2018   Anxiety 11/29/2017   Chronic hoarseness 01/15/2017   Hypothyroidism 06/06/2014   Uncontrolled type 2 diabetes mellitus with hyperglycemia, without long-term current use of insulin ([place]) 06/06/2014     REFERRING PROVIDER: Ronnie Doss DO  REFERRING DIAG: Postural kyphosis of thoracolumbar region.  Rationale for Evaluation and Treatment Rehabilitation  THERAPY DIAG:  Other low back pain  Abnormal posture  ONSET DATE: Ongoing.  SUBJECTIVE:  SUBJECTIVE STATEMENT: Pt reports that her back is feeling good today.  Denies any pain.   PERTINENT HISTORY:  DM, Hypothyroidism.  PAIN:  Are you having pain? No   PRECAUTIONS: None  WEIGHT BEARING RESTRICTIONS No  FALLS:  Has patient fallen in last 6 months? No  LIVING ENVIRONMENT: Lives with: lives alone Lives in: House/apartment Has following equipment at home: None.  She uses a cane on occasions.  OCCUPATION: Retired.  PLOF: Independent with basic ADLs  PATIENT GOALS:  Reduce pain and be able to walk and stand longer.   OBJECTIVE:   PATIENT SURVEYS:  FOTO Complete.  POSTURE: rounded shoulders, forward head, decreased lumbar lordosis, increased thoracic kyphosis, and flexed trunk   PALPATION: CC of pain is palable pain bilateral over her lower thoracic/upper lumbar paraspinal musculature which exhibits a great deal of tone.    LOWER EXTREMITY ROM:                          WF/NL for bilateral UE AROM.  LOWER EXTREMITY MMT:                       Normal bil UE strength.                            DTR's:  Normal UE DTR's.  GAIT: The patient walks in a flexed trunk posture.    TODAY'S TREATMENT                                     10/12 EXERCISE LOG  Exercise Repetitions and Resistance Comments  Nustep  Lvl 4 x 15 mins   Seated Heel/Toe Raises 20 reps each   Ball Squeezes 2 mins   Rows Red t-band x 25 reps   Extension Red t-band x 25 reps   LAQ 2# x 25 reps bil   Seated Marches 2# x 25 reps bil    Blank cell = exercise not performed today   Modalities  Date:  Unattended Estim: Lumbar, IFC 80-150 Hz, 15 mins, Pain and Tone Hot Pack: Lumbar, 15 mins, Pain and Tone   ASSESSMENT:  CLINICAL IMPRESSION: Pt arrives for today's treatment session denying any pain.  Pt able to tolerate increased resistance on Nustep today with complaints of fatigue.  Pt able to tolerate increased reps with all exercises today with minimal fatigue.  Normal responses to estim and MH noted upon removal.  Pt denied any pain at completion of today's treatment session.   OBJECTIVE IMPAIRMENTS Abnormal gait, decreased activity tolerance, increased muscle spasms, postural dysfunction, and pain.   ACTIVITY LIMITATIONS standing and locomotion level  PARTICIPATION LIMITATIONS: cleaning and laundry  PERSONAL FACTORS Time since onset of injury/illness/exacerbation are also affecting patient's functional outcome.   REHAB POTENTIAL: Good  CLINICAL DECISION MAKING: Stable/uncomplicated  EVALUATION COMPLEXITY: Low   GOALS: Goals reviewed with patient? Yes  SHORT TERM GOALS: Target date: 04/16/2022  Ind with initial HEP. Baseline: Goal status: INITIAL   LONG TERM GOALS: Target date: 05/14/2022  Perform ADL's with pain not > 3-4/10. Baseline:  Goal status: INITIAL  2.  Stand 20 minutes with pain not > 3-4/10. Baseline:  Goal status: INITIAL  3.  Walk a  community distance with pain not > 3-4/10. Baseline:  Goal status: INITIAL  PLAN: PT FREQUENCY: 2x/week  PT DURATION: 6 weeks  PLANNED INTERVENTIONS: Therapeutic exercises, Therapeutic activity, Patient/Family education, Self Care, Dry Needling, Electrical stimulation, Cryotherapy, Moist heat, Ultrasound, and Manual therapy.  PLAN FOR NEXT SESSION: Combo e'stim/US, STW/M, postural exercise progression.     Kathrynn Ducking, PTA 04/09/2022, 12:16 PM

## 2022-04-10 ENCOUNTER — Other Ambulatory Visit: Payer: Self-pay | Admitting: Family Medicine

## 2022-04-10 DIAGNOSIS — E039 Hypothyroidism, unspecified: Secondary | ICD-10-CM

## 2022-04-10 DIAGNOSIS — E7800 Pure hypercholesterolemia, unspecified: Secondary | ICD-10-CM

## 2022-04-19 DIAGNOSIS — N179 Acute kidney failure, unspecified: Secondary | ICD-10-CM | POA: Diagnosis not present

## 2022-04-19 DIAGNOSIS — Z20822 Contact with and (suspected) exposure to covid-19: Secondary | ICD-10-CM | POA: Diagnosis not present

## 2022-04-19 DIAGNOSIS — M549 Dorsalgia, unspecified: Secondary | ICD-10-CM | POA: Diagnosis not present

## 2022-04-19 DIAGNOSIS — Z7982 Long term (current) use of aspirin: Secondary | ICD-10-CM | POA: Diagnosis not present

## 2022-04-19 DIAGNOSIS — Z79899 Other long term (current) drug therapy: Secondary | ICD-10-CM | POA: Diagnosis not present

## 2022-04-19 DIAGNOSIS — E039 Hypothyroidism, unspecified: Secondary | ICD-10-CM | POA: Diagnosis not present

## 2022-04-19 DIAGNOSIS — R54 Age-related physical debility: Secondary | ICD-10-CM | POA: Diagnosis not present

## 2022-04-19 DIAGNOSIS — Z7984 Long term (current) use of oral hypoglycemic drugs: Secondary | ICD-10-CM | POA: Diagnosis not present

## 2022-04-19 DIAGNOSIS — G9341 Metabolic encephalopathy: Secondary | ICD-10-CM | POA: Diagnosis not present

## 2022-04-19 DIAGNOSIS — Z7901 Long term (current) use of anticoagulants: Secondary | ICD-10-CM | POA: Diagnosis not present

## 2022-04-19 DIAGNOSIS — M545 Low back pain, unspecified: Secondary | ICD-10-CM | POA: Diagnosis not present

## 2022-04-19 DIAGNOSIS — E11618 Type 2 diabetes mellitus with other diabetic arthropathy: Secondary | ICD-10-CM | POA: Diagnosis not present

## 2022-04-19 DIAGNOSIS — R109 Unspecified abdominal pain: Secondary | ICD-10-CM | POA: Diagnosis not present

## 2022-04-19 DIAGNOSIS — I350 Nonrheumatic aortic (valve) stenosis: Secondary | ICD-10-CM | POA: Diagnosis not present

## 2022-04-19 DIAGNOSIS — Z792 Long term (current) use of antibiotics: Secondary | ICD-10-CM | POA: Diagnosis not present

## 2022-04-19 DIAGNOSIS — I10 Essential (primary) hypertension: Secondary | ICD-10-CM | POA: Diagnosis not present

## 2022-04-19 DIAGNOSIS — A419 Sepsis, unspecified organism: Secondary | ICD-10-CM | POA: Diagnosis not present

## 2022-04-19 DIAGNOSIS — I700 Atherosclerosis of aorta: Secondary | ICD-10-CM | POA: Diagnosis not present

## 2022-04-19 DIAGNOSIS — R6520 Severe sepsis without septic shock: Secondary | ICD-10-CM | POA: Diagnosis not present

## 2022-04-19 DIAGNOSIS — I519 Heart disease, unspecified: Secondary | ICD-10-CM | POA: Diagnosis not present

## 2022-04-19 DIAGNOSIS — E119 Type 2 diabetes mellitus without complications: Secondary | ICD-10-CM | POA: Diagnosis not present

## 2022-04-19 DIAGNOSIS — K449 Diaphragmatic hernia without obstruction or gangrene: Secondary | ICD-10-CM | POA: Diagnosis not present

## 2022-04-19 DIAGNOSIS — I4819 Other persistent atrial fibrillation: Secondary | ICD-10-CM | POA: Diagnosis not present

## 2022-04-19 DIAGNOSIS — I480 Paroxysmal atrial fibrillation: Secondary | ICD-10-CM | POA: Diagnosis not present

## 2022-04-19 DIAGNOSIS — R0902 Hypoxemia: Secondary | ICD-10-CM | POA: Diagnosis not present

## 2022-04-19 DIAGNOSIS — E871 Hypo-osmolality and hyponatremia: Secondary | ICD-10-CM | POA: Diagnosis not present

## 2022-04-19 DIAGNOSIS — E11610 Type 2 diabetes mellitus with diabetic neuropathic arthropathy: Secondary | ICD-10-CM | POA: Diagnosis not present

## 2022-04-19 DIAGNOSIS — G8929 Other chronic pain: Secondary | ICD-10-CM | POA: Diagnosis not present

## 2022-04-19 DIAGNOSIS — I119 Hypertensive heart disease without heart failure: Secondary | ICD-10-CM | POA: Diagnosis not present

## 2022-04-19 DIAGNOSIS — N3001 Acute cystitis with hematuria: Secondary | ICD-10-CM | POA: Diagnosis not present

## 2022-04-19 DIAGNOSIS — Z794 Long term (current) use of insulin: Secondary | ICD-10-CM | POA: Diagnosis not present

## 2022-04-19 DIAGNOSIS — R0989 Other specified symptoms and signs involving the circulatory and respiratory systems: Secondary | ICD-10-CM | POA: Diagnosis not present

## 2022-04-20 DIAGNOSIS — N3001 Acute cystitis with hematuria: Secondary | ICD-10-CM | POA: Diagnosis not present

## 2022-04-20 DIAGNOSIS — E039 Hypothyroidism, unspecified: Secondary | ICD-10-CM | POA: Diagnosis not present

## 2022-04-20 DIAGNOSIS — Z79899 Other long term (current) drug therapy: Secondary | ICD-10-CM | POA: Diagnosis not present

## 2022-04-20 DIAGNOSIS — I700 Atherosclerosis of aorta: Secondary | ICD-10-CM | POA: Diagnosis not present

## 2022-04-20 DIAGNOSIS — R109 Unspecified abdominal pain: Secondary | ICD-10-CM | POA: Diagnosis not present

## 2022-04-20 DIAGNOSIS — N179 Acute kidney failure, unspecified: Secondary | ICD-10-CM | POA: Diagnosis not present

## 2022-04-20 DIAGNOSIS — R54 Age-related physical debility: Secondary | ICD-10-CM | POA: Diagnosis not present

## 2022-04-20 DIAGNOSIS — Z792 Long term (current) use of antibiotics: Secondary | ICD-10-CM | POA: Diagnosis not present

## 2022-04-20 DIAGNOSIS — G8929 Other chronic pain: Secondary | ICD-10-CM | POA: Diagnosis not present

## 2022-04-20 DIAGNOSIS — M549 Dorsalgia, unspecified: Secondary | ICD-10-CM | POA: Diagnosis not present

## 2022-04-20 DIAGNOSIS — G9341 Metabolic encephalopathy: Secondary | ICD-10-CM | POA: Diagnosis not present

## 2022-04-20 DIAGNOSIS — E119 Type 2 diabetes mellitus without complications: Secondary | ICD-10-CM | POA: Diagnosis not present

## 2022-04-22 DIAGNOSIS — R54 Age-related physical debility: Secondary | ICD-10-CM | POA: Diagnosis not present

## 2022-04-22 DIAGNOSIS — Z792 Long term (current) use of antibiotics: Secondary | ICD-10-CM | POA: Diagnosis not present

## 2022-04-22 DIAGNOSIS — E039 Hypothyroidism, unspecified: Secondary | ICD-10-CM | POA: Diagnosis not present

## 2022-04-22 DIAGNOSIS — M549 Dorsalgia, unspecified: Secondary | ICD-10-CM | POA: Diagnosis not present

## 2022-04-22 DIAGNOSIS — N3001 Acute cystitis with hematuria: Secondary | ICD-10-CM | POA: Diagnosis not present

## 2022-04-22 DIAGNOSIS — E119 Type 2 diabetes mellitus without complications: Secondary | ICD-10-CM | POA: Diagnosis not present

## 2022-04-23 DIAGNOSIS — M549 Dorsalgia, unspecified: Secondary | ICD-10-CM | POA: Diagnosis not present

## 2022-04-23 DIAGNOSIS — E119 Type 2 diabetes mellitus without complications: Secondary | ICD-10-CM | POA: Diagnosis not present

## 2022-04-23 DIAGNOSIS — N3001 Acute cystitis with hematuria: Secondary | ICD-10-CM | POA: Diagnosis not present

## 2022-04-23 DIAGNOSIS — E039 Hypothyroidism, unspecified: Secondary | ICD-10-CM | POA: Diagnosis not present

## 2022-04-23 DIAGNOSIS — Z79899 Other long term (current) drug therapy: Secondary | ICD-10-CM | POA: Diagnosis not present

## 2022-04-23 DIAGNOSIS — Z792 Long term (current) use of antibiotics: Secondary | ICD-10-CM | POA: Diagnosis not present

## 2022-04-23 DIAGNOSIS — R54 Age-related physical debility: Secondary | ICD-10-CM | POA: Diagnosis not present

## 2022-04-24 DIAGNOSIS — M549 Dorsalgia, unspecified: Secondary | ICD-10-CM | POA: Diagnosis not present

## 2022-04-24 DIAGNOSIS — E039 Hypothyroidism, unspecified: Secondary | ICD-10-CM | POA: Diagnosis not present

## 2022-04-24 DIAGNOSIS — Z79899 Other long term (current) drug therapy: Secondary | ICD-10-CM | POA: Diagnosis not present

## 2022-04-24 DIAGNOSIS — E119 Type 2 diabetes mellitus without complications: Secondary | ICD-10-CM | POA: Diagnosis not present

## 2022-04-24 DIAGNOSIS — Z792 Long term (current) use of antibiotics: Secondary | ICD-10-CM | POA: Diagnosis not present

## 2022-04-24 DIAGNOSIS — N3001 Acute cystitis with hematuria: Secondary | ICD-10-CM | POA: Diagnosis not present

## 2022-04-24 DIAGNOSIS — Z794 Long term (current) use of insulin: Secondary | ICD-10-CM | POA: Diagnosis not present

## 2022-04-24 DIAGNOSIS — R54 Age-related physical debility: Secondary | ICD-10-CM | POA: Diagnosis not present

## 2022-04-27 DIAGNOSIS — I480 Paroxysmal atrial fibrillation: Secondary | ICD-10-CM | POA: Diagnosis not present

## 2022-04-29 DIAGNOSIS — R9431 Abnormal electrocardiogram [ECG] [EKG]: Secondary | ICD-10-CM | POA: Diagnosis not present

## 2022-04-29 DIAGNOSIS — R001 Bradycardia, unspecified: Secondary | ICD-10-CM | POA: Diagnosis not present

## 2022-04-29 DIAGNOSIS — N3001 Acute cystitis with hematuria: Secondary | ICD-10-CM | POA: Diagnosis not present

## 2022-04-30 DIAGNOSIS — M549 Dorsalgia, unspecified: Secondary | ICD-10-CM | POA: Diagnosis not present

## 2022-04-30 DIAGNOSIS — R011 Cardiac murmur, unspecified: Secondary | ICD-10-CM | POA: Diagnosis not present

## 2022-04-30 DIAGNOSIS — I4819 Other persistent atrial fibrillation: Secondary | ICD-10-CM | POA: Diagnosis not present

## 2022-04-30 DIAGNOSIS — E119 Type 2 diabetes mellitus without complications: Secondary | ICD-10-CM | POA: Diagnosis not present

## 2022-04-30 DIAGNOSIS — Z7982 Long term (current) use of aspirin: Secondary | ICD-10-CM | POA: Diagnosis not present

## 2022-04-30 DIAGNOSIS — R54 Age-related physical debility: Secondary | ICD-10-CM | POA: Diagnosis not present

## 2022-04-30 DIAGNOSIS — G8929 Other chronic pain: Secondary | ICD-10-CM | POA: Diagnosis not present

## 2022-04-30 DIAGNOSIS — E039 Hypothyroidism, unspecified: Secondary | ICD-10-CM | POA: Diagnosis not present

## 2022-05-05 DIAGNOSIS — E039 Hypothyroidism, unspecified: Secondary | ICD-10-CM | POA: Diagnosis not present

## 2022-05-05 DIAGNOSIS — M549 Dorsalgia, unspecified: Secondary | ICD-10-CM | POA: Diagnosis not present

## 2022-05-05 DIAGNOSIS — R011 Cardiac murmur, unspecified: Secondary | ICD-10-CM | POA: Diagnosis not present

## 2022-05-05 DIAGNOSIS — E119 Type 2 diabetes mellitus without complications: Secondary | ICD-10-CM | POA: Diagnosis not present

## 2022-05-05 DIAGNOSIS — G8929 Other chronic pain: Secondary | ICD-10-CM | POA: Diagnosis not present

## 2022-05-05 DIAGNOSIS — I4819 Other persistent atrial fibrillation: Secondary | ICD-10-CM | POA: Diagnosis not present

## 2022-05-05 DIAGNOSIS — Z7982 Long term (current) use of aspirin: Secondary | ICD-10-CM | POA: Diagnosis not present

## 2022-05-05 DIAGNOSIS — R54 Age-related physical debility: Secondary | ICD-10-CM | POA: Diagnosis not present

## 2022-05-06 DIAGNOSIS — G8929 Other chronic pain: Secondary | ICD-10-CM | POA: Diagnosis not present

## 2022-05-06 DIAGNOSIS — E039 Hypothyroidism, unspecified: Secondary | ICD-10-CM | POA: Diagnosis not present

## 2022-05-06 DIAGNOSIS — R54 Age-related physical debility: Secondary | ICD-10-CM | POA: Diagnosis not present

## 2022-05-06 DIAGNOSIS — I4819 Other persistent atrial fibrillation: Secondary | ICD-10-CM | POA: Diagnosis not present

## 2022-05-06 DIAGNOSIS — R011 Cardiac murmur, unspecified: Secondary | ICD-10-CM | POA: Diagnosis not present

## 2022-05-06 DIAGNOSIS — Z7982 Long term (current) use of aspirin: Secondary | ICD-10-CM | POA: Diagnosis not present

## 2022-05-06 DIAGNOSIS — E119 Type 2 diabetes mellitus without complications: Secondary | ICD-10-CM | POA: Diagnosis not present

## 2022-05-06 DIAGNOSIS — M549 Dorsalgia, unspecified: Secondary | ICD-10-CM | POA: Diagnosis not present

## 2022-05-07 ENCOUNTER — Ambulatory Visit (INDEPENDENT_AMBULATORY_CARE_PROVIDER_SITE_OTHER): Payer: Medicare Other | Admitting: Nurse Practitioner

## 2022-05-07 ENCOUNTER — Encounter: Payer: Self-pay | Admitting: Nurse Practitioner

## 2022-05-07 VITALS — BP 171/66 | HR 47 | Temp 97.0°F | Resp 20 | Ht 60.0 in | Wt 121.0 lb

## 2022-05-07 DIAGNOSIS — M549 Dorsalgia, unspecified: Secondary | ICD-10-CM | POA: Diagnosis not present

## 2022-05-07 DIAGNOSIS — M545 Low back pain, unspecified: Secondary | ICD-10-CM | POA: Diagnosis not present

## 2022-05-07 DIAGNOSIS — G8929 Other chronic pain: Secondary | ICD-10-CM

## 2022-05-07 DIAGNOSIS — I10 Essential (primary) hypertension: Secondary | ICD-10-CM

## 2022-05-07 DIAGNOSIS — E039 Hypothyroidism, unspecified: Secondary | ICD-10-CM | POA: Diagnosis not present

## 2022-05-07 DIAGNOSIS — R011 Cardiac murmur, unspecified: Secondary | ICD-10-CM | POA: Diagnosis not present

## 2022-05-07 DIAGNOSIS — R54 Age-related physical debility: Secondary | ICD-10-CM | POA: Diagnosis not present

## 2022-05-07 DIAGNOSIS — Z7982 Long term (current) use of aspirin: Secondary | ICD-10-CM | POA: Diagnosis not present

## 2022-05-07 DIAGNOSIS — I4819 Other persistent atrial fibrillation: Secondary | ICD-10-CM | POA: Diagnosis not present

## 2022-05-07 DIAGNOSIS — Z09 Encounter for follow-up examination after completed treatment for conditions other than malignant neoplasm: Secondary | ICD-10-CM

## 2022-05-07 DIAGNOSIS — E119 Type 2 diabetes mellitus without complications: Secondary | ICD-10-CM | POA: Diagnosis not present

## 2022-05-07 NOTE — Progress Notes (Addendum)
   Subjective:    Patient ID: Maria Alexander, female    DOB: [birth year], 88 y.o.   MRN: [id number]   Chief Complaint: Hospitalization Follow-up (Wants ears checked/)   HPI Patient was in hospital for 10 days with UTI. Was treated with IV,s she feel at home prior to admission . She is still having some back pain since her fall. Her uti symptoms are better. Rates back pain 5/10 currently- has long history of back issues and wears a back brace.   The hospital changed her blood pressure meds. Blood pressure has been going up and down at home. Ranges from 110/40 to 170/80. They added metoprolol '25mg'$  BID  Review of Systems  Constitutional:  Negative for diaphoresis.  Eyes:  Negative for pain.  Respiratory:  Negative for shortness of breath.   Cardiovascular:  Negative for chest pain, palpitations and leg swelling.  Gastrointestinal:  Negative for abdominal pain.  Endocrine: Negative for polydipsia.  Skin:  Negative for rash.  Neurological:  Negative for dizziness, weakness and headaches.  Hematological:  Does not bruise/bleed easily.  All other systems reviewed and are negative.      Objective:   Physical Exam Vitals reviewed.  Constitutional:      Appearance: Normal appearance.  HENT:     Right Ear: There is impacted cerumen.     Left Ear: There is impacted cerumen.  Cardiovascular:     Rate and Rhythm: Normal rate and regular rhythm.     Heart sounds: Normal heart sounds.  Pulmonary:     Effort: Pulmonary effort is normal.     Breath sounds: Normal breath sounds.  Skin:    General: Skin is warm.  Neurological:     General: No focal deficit present.     Mental Status: She is alert and oriented to person, place, and time.  Psychiatric:        Mood and Affect: Mood normal.        Behavior: Behavior normal.    Refused ear lavage   BP (!) 171/66   Pulse (!) 47   Temp (!) 97 F (36.1 C) (Temporal)   Resp 20   Ht 5' (1.524 m)   Wt 121 lb (54.9 kg)   SpO2 96%   BMI  23.63 kg/m      Assessment & Plan:   [name] comes in today with chief complaint of Hospitalization Follow-up (Wants ears checked/)   Diagnosis and orders addressed:  1. Primary hypertension Dash diet Metoprolol '25mg'$  1/2 bid for now Continue cozaar at current dose Keep diary of blood pressure  2. Chronic midline low back pain without sciatica Wear back brace fallprevention  3. Hospital discharge follow-up Hospital records reviewed  4. Ear wax build up-  Debrox OTC several times a week   Labs pending Health Maintenance reviewed Diet and exercise encouraged  Follow up plan: As scheduled   Mary-Margaret Hassell Done, FNP

## 2022-05-07 NOTE — Addendum Note (Signed)
Addended by: Chevis Pretty on: 05/07/2022 11:01 AM   Modules accepted: Level of Service

## 2022-05-07 NOTE — Patient Instructions (Signed)

## 2022-05-12 DIAGNOSIS — Z7982 Long term (current) use of aspirin: Secondary | ICD-10-CM | POA: Diagnosis not present

## 2022-05-12 DIAGNOSIS — E039 Hypothyroidism, unspecified: Secondary | ICD-10-CM | POA: Diagnosis not present

## 2022-05-12 DIAGNOSIS — R54 Age-related physical debility: Secondary | ICD-10-CM | POA: Diagnosis not present

## 2022-05-12 DIAGNOSIS — G8929 Other chronic pain: Secondary | ICD-10-CM | POA: Diagnosis not present

## 2022-05-12 DIAGNOSIS — I4819 Other persistent atrial fibrillation: Secondary | ICD-10-CM | POA: Diagnosis not present

## 2022-05-12 DIAGNOSIS — E119 Type 2 diabetes mellitus without complications: Secondary | ICD-10-CM | POA: Diagnosis not present

## 2022-05-12 DIAGNOSIS — R011 Cardiac murmur, unspecified: Secondary | ICD-10-CM | POA: Diagnosis not present

## 2022-05-12 DIAGNOSIS — M549 Dorsalgia, unspecified: Secondary | ICD-10-CM | POA: Diagnosis not present

## 2022-05-15 DIAGNOSIS — Z7982 Long term (current) use of aspirin: Secondary | ICD-10-CM | POA: Diagnosis not present

## 2022-05-15 DIAGNOSIS — R011 Cardiac murmur, unspecified: Secondary | ICD-10-CM | POA: Diagnosis not present

## 2022-05-15 DIAGNOSIS — E039 Hypothyroidism, unspecified: Secondary | ICD-10-CM | POA: Diagnosis not present

## 2022-05-15 DIAGNOSIS — E119 Type 2 diabetes mellitus without complications: Secondary | ICD-10-CM | POA: Diagnosis not present

## 2022-05-15 DIAGNOSIS — M549 Dorsalgia, unspecified: Secondary | ICD-10-CM | POA: Diagnosis not present

## 2022-05-15 DIAGNOSIS — I4819 Other persistent atrial fibrillation: Secondary | ICD-10-CM | POA: Diagnosis not present

## 2022-05-15 DIAGNOSIS — R54 Age-related physical debility: Secondary | ICD-10-CM | POA: Diagnosis not present

## 2022-05-15 DIAGNOSIS — G8929 Other chronic pain: Secondary | ICD-10-CM | POA: Diagnosis not present

## 2022-05-18 DIAGNOSIS — E039 Hypothyroidism, unspecified: Secondary | ICD-10-CM | POA: Diagnosis not present

## 2022-05-18 DIAGNOSIS — Z7982 Long term (current) use of aspirin: Secondary | ICD-10-CM | POA: Diagnosis not present

## 2022-05-18 DIAGNOSIS — R54 Age-related physical debility: Secondary | ICD-10-CM | POA: Diagnosis not present

## 2022-05-18 DIAGNOSIS — M549 Dorsalgia, unspecified: Secondary | ICD-10-CM | POA: Diagnosis not present

## 2022-05-18 DIAGNOSIS — R011 Cardiac murmur, unspecified: Secondary | ICD-10-CM | POA: Diagnosis not present

## 2022-05-18 DIAGNOSIS — G8929 Other chronic pain: Secondary | ICD-10-CM | POA: Diagnosis not present

## 2022-05-18 DIAGNOSIS — I4819 Other persistent atrial fibrillation: Secondary | ICD-10-CM | POA: Diagnosis not present

## 2022-05-18 DIAGNOSIS — E119 Type 2 diabetes mellitus without complications: Secondary | ICD-10-CM | POA: Diagnosis not present

## 2022-05-19 ENCOUNTER — Ambulatory Visit (INDEPENDENT_AMBULATORY_CARE_PROVIDER_SITE_OTHER): Payer: Medicare Other

## 2022-05-19 DIAGNOSIS — R54 Age-related physical debility: Secondary | ICD-10-CM | POA: Diagnosis not present

## 2022-05-19 DIAGNOSIS — Z7982 Long term (current) use of aspirin: Secondary | ICD-10-CM

## 2022-05-19 DIAGNOSIS — E119 Type 2 diabetes mellitus without complications: Secondary | ICD-10-CM | POA: Diagnosis not present

## 2022-05-19 DIAGNOSIS — I4819 Other persistent atrial fibrillation: Secondary | ICD-10-CM

## 2022-05-19 DIAGNOSIS — M549 Dorsalgia, unspecified: Secondary | ICD-10-CM

## 2022-05-19 DIAGNOSIS — E039 Hypothyroidism, unspecified: Secondary | ICD-10-CM

## 2022-05-19 DIAGNOSIS — R011 Cardiac murmur, unspecified: Secondary | ICD-10-CM

## 2022-05-19 DIAGNOSIS — G8929 Other chronic pain: Secondary | ICD-10-CM | POA: Diagnosis not present

## 2022-05-20 DIAGNOSIS — I4819 Other persistent atrial fibrillation: Secondary | ICD-10-CM | POA: Diagnosis not present

## 2022-06-01 DIAGNOSIS — Z961 Presence of intraocular lens: Secondary | ICD-10-CM | POA: Diagnosis not present

## 2022-06-01 DIAGNOSIS — H353131 Nonexudative age-related macular degeneration, bilateral, early dry stage: Secondary | ICD-10-CM | POA: Diagnosis not present

## 2022-06-01 DIAGNOSIS — E119 Type 2 diabetes mellitus without complications: Secondary | ICD-10-CM | POA: Diagnosis not present

## 2022-06-01 DIAGNOSIS — H04123 Dry eye syndrome of bilateral lacrimal glands: Secondary | ICD-10-CM | POA: Diagnosis not present

## 2022-06-01 LAB — HM DIABETES EYE EXAM

## 2022-06-30 ENCOUNTER — Ambulatory Visit: Payer: Medicare Other | Admitting: Family Medicine

## 2022-07-01 ENCOUNTER — Encounter: Payer: Self-pay | Admitting: Family Medicine

## 2022-07-01 ENCOUNTER — Ambulatory Visit (INDEPENDENT_AMBULATORY_CARE_PROVIDER_SITE_OTHER): Payer: Medicare Other | Admitting: Family Medicine

## 2022-07-01 VITALS — BP 127/73 | HR 76 | Temp 98.3°F | Ht 60.0 in | Wt 120.6 lb

## 2022-07-01 DIAGNOSIS — H6123 Impacted cerumen, bilateral: Secondary | ICD-10-CM

## 2022-07-01 DIAGNOSIS — E785 Hyperlipidemia, unspecified: Secondary | ICD-10-CM | POA: Diagnosis not present

## 2022-07-01 DIAGNOSIS — E1122 Type 2 diabetes mellitus with diabetic chronic kidney disease: Secondary | ICD-10-CM

## 2022-07-01 DIAGNOSIS — E039 Hypothyroidism, unspecified: Secondary | ICD-10-CM

## 2022-07-01 DIAGNOSIS — N1831 Chronic kidney disease, stage 3a: Secondary | ICD-10-CM | POA: Diagnosis not present

## 2022-07-01 DIAGNOSIS — E1169 Type 2 diabetes mellitus with other specified complication: Secondary | ICD-10-CM | POA: Diagnosis not present

## 2022-07-01 DIAGNOSIS — E1159 Type 2 diabetes mellitus with other circulatory complications: Secondary | ICD-10-CM | POA: Diagnosis not present

## 2022-07-01 DIAGNOSIS — Z23 Encounter for immunization: Secondary | ICD-10-CM

## 2022-07-01 DIAGNOSIS — I152 Hypertension secondary to endocrine disorders: Secondary | ICD-10-CM

## 2022-07-01 LAB — BAYER DCA HB A1C WAIVED: HB A1C (BAYER DCA - WAIVED): 7.8 % — ABNORMAL HIGH (ref 4.8–5.6)

## 2022-07-01 MED ORDER — SAXAGLIPTIN HCL 5 MG PO TABS: 5.0000 mg | ORAL_TABLET | Freq: Every day | ORAL | 3 refills | Status: DC

## 2022-07-01 NOTE — Progress Notes (Signed)
Subjective: CC:DM/ thyroid PCP: Janora Norlander, DO [id number] W Rocca is a 89 y.o. female presenting to clinic today for:  1. Type 2 Diabetes with hypertension, hyperlipidemia:  Patient is accompanied today's visit by her daughter, who notes that her mother is not compliant with a diabetic diet at all.  She frequently eats cookies, candies and sugary beverages.  Apparently the patient has had some metformin intolerance in the past but her daughter thinks that this is in fact a normal side effect of the medication due to her poor diet.  She was hospitalized recently for altered mental status related to urinary tract infection.  Glipizide was discontinued at that time but she was continued on the Onglyza 5 mg.  She gets this through patient assistance program.  She is compliant with her Lipitor and losartan and metoprolol.  Last eye exam: UTD Last foot exam: UTD Last A1c: A1c 8.0 03/2022. Hospitalized for AMS 2/2 UTI  Nephropathy screen indicated?: UTD Last flu, zoster and/or pneumovax:  Immunization History  Administered Date(s) Administered   Fluad Quad(high Dose 65+) 04/01/2020   Influenza, High Dose Seasonal PF 04/04/2018   Influenza,inj,Quad PF,6+ Mos 03/31/2017   Influenza-Unspecified 04/07/2016, 04/04/2018, 02/28/2019, 03/27/2021, 04/13/2022   Moderna Sars-Covid-2 Vaccination 07/11/2019, 08/11/2019, 04/24/2020, 03/27/2021   Pneumococcal Conjugate-13 08/22/2014   Pneumococcal Polysaccharide-23 04/11/1997   Td (Adult),5 Lf Tetanus Toxid, Preservative Free 11/09/2007   Zoster Recombinat (Shingrix) 03/22/2017, 06/02/2017    2.  Hypothyroidism Compliant with her medication.  No reports of tremor, heart palpitations or unplanned weight changes  3.  Ear fullness Patient reports that bilateral ears feel stopped up.  She is not sure if she has allergies or if this is from wax.  She would like these checked   ROS: Per HPI  Allergies  Allergen Reactions   Shellfish Allergy  Hives   Past Medical History:  Diagnosis Date   Diabetes ([place])    DVT (deep venous thrombosis) ([place])    H/O blood clots 2000   High cholesterol    Hypothyroid    Stroke [facility])     Current Outpatient Medications:    ALPRAZolam (XANAX) 1 MG tablet, Take 0.5-1 tablets (0.5-1 mg total) by mouth at bedtime as needed for anxiety., Disp: 30 tablet, Rfl: 3   atorvastatin (LIPITOR) 80 MG tablet, TAKE (1/2) TABLET DAILY., Disp: 90 tablet, Rfl: 0   levothyroxine (SYNTHROID) 75 MCG tablet, Take 75 mcg by mouth daily before breakfast., Disp: , Rfl:    losartan (COZAAR) 100 MG tablet, Take 100 mg by mouth daily., Disp: , Rfl:    metoprolol tartrate (LOPRESSOR) 25 MG tablet, Take 25 mg by mouth 2 (two) times daily., Disp: , Rfl:    Multiple Vitamin (MULTIVITAMIN) tablet, Take 1 tablet by mouth daily. Centrum Women's 50+, Disp: , Rfl:  Social History   Socioeconomic History   Marital status: Widowed    Spouse name: Not on file   Number of children: 3   Years of education: 10   Highest education level: 10th grade  Occupational History   Occupation: retired  Tobacco Use   Smoking status: Former    Types: Cigarettes    Quit date: 02/05/1995    Years since quitting: 27.4    Passive exposure: Past   Smokeless tobacco: Never  Vaping Use   Vaping Use: Never used  Substance and Sexual Activity   Alcohol use: No    Alcohol/week: 0.0 standard drinks of alcohol   Drug use: No   Sexual  activity: Not Currently    Birth control/protection: None  Other Topics Concern   Not on file  Social History Narrative   Lives alone- has a basement, but doesn't use it   Daughter lives nearby   Social Determinants of Health   Financial Resource Strain: Low Risk  (08/05/2021)   Overall Financial Resource Strain (CARDIA)    Difficulty of Paying Living Expenses: Not hard at all  Food Insecurity: No Food Insecurity (08/05/2021)   Hunger Vital Sign    Worried About Running Out of Food in the Last Year: Never true     [place] in the Last Year: Never true  Transportation Needs: No Transportation Needs (08/05/2021)   PRAPARE - Hydrologist (Medical): No    Lack of Transportation (Non-Medical): No  Physical Activity: Sufficiently Active (08/05/2021)   Exercise Vital Sign    Days of Exercise per Week: 3 days    Minutes of Exercise per Session: 60 min  Stress: No Stress Concern Present (08/05/2021)   [place]    Feeling of Stress : Not at all  Social Connections: Moderately Integrated (08/05/2021)   Social Connection and Isolation Panel [NHANES]    Frequency of Communication with Friends and Family: More than three times a week    Frequency of Social Gatherings with Friends and Family: More than three times a week    Attends Religious Services: More than 4 times per year    Active Member of [employer] or Organizations: Yes    Attends Archivist Meetings: More than 4 times per year    Marital Status: Widowed  Intimate Partner Violence: Not At Risk (08/05/2021)   Humiliation, Afraid, Rape, and Kick questionnaire    Fear of Current or Ex-Partner: No    Emotionally Abused: No    Physically Abused: No    Sexually Abused: No   Family History  Problem Relation Age of Onset   Cancer Mother    Heart disease Father        before age 14   Heart attack Father    Diabetes Sister    Diabetes Brother     Objective: Office vital signs reviewed. BP 127/73   Pulse 76   Temp 98.3 F (36.8 C)   Ht 5' (1.524 m)   Wt 120 lb 9.6 oz (54.7 kg)   SpO2 98%   BMI 23.55 kg/m   Physical Examination:  General: Awake, alert, well nourished, No acute distress HEENT: Bilateral TMs obscured by cerumen.  The cerumen is yellow and dry Cardio: regular rate and rhythm, S1S2 heard, 2/6 systolic murmurs appreciated Pulm: clear to auscultation bilaterally, no wheezes, rhonchi or rales; normal work of breathing on room  air MSK: Ambulating independently with hunched station  Assessment/ Plan: 89 y.o. female   Type 2 diabetes mellitus with stage 3a chronic kidney disease, without long-term current use of insulin ([place]) - Plan: Bayer DCA Hb A1c Waived, CMP14+EGFR, AMB Referral to Chronic Care Management Services, saxagliptin HCl (ONGLYZA) 5 MG TABS tablet  Hyperlipidemia associated with type 2 diabetes mellitus ([place]) - Plan: CMP14+EGFR, AMB Referral to Chronic Care Management Services  Hypertension associated with diabetes ([place]) - Plan: CMP14+EGFR, AMB Referral to Chronic Care Management Services  Acquired hypothyroidism - Plan: TSH, T4, Free, CMP14+EGFR  Bilateral impacted cerumen  Sugar is borderline with A1c at 7.8.  I certainly think that this patient given her advanced age warrants having  a more liberal A1c up to 7.9 but I still think that she should be taking care of her body and avoiding unnecessary sweets.  I reiterated that to her today.  Discontinue the glipizide.  Dietary modification highly recommended and if she cannot keep her sugar under control with the Onglyza and dietary changes alone we may need to consider adding back metformin as I think that is a much lower risk medication than the glipizide.  She is not yet due for lipid panel but she will continue Lipitor daily as prescribed  Blood pressure is controlled.  No changes  Check thyroid levels, renal function and liver enzymes  Dried bilateral cerumen impaction appreciated right greater than left.  She will start Debrox eardrops and we will see her back in the next 10 to 14 days to irrigate her ears  No orders of the defined types were placed in this encounter.  No orders of the defined types were placed in this encounter.    Janora Norlander, DO [place] [phone]

## 2022-07-01 NOTE — Patient Instructions (Signed)
No sodas, no milkshakes (unless they are [place]), no candy, no cookies. Drink MORE water! This helps you NOT get a UTI. I've messaged Maria Alexander about that [place]. I think we will have to change your medications from Glipizide.  I'll let you know as soon as I get your labs back.

## 2022-07-02 LAB — CMP14+EGFR
ALT: 20 IU/L (ref 0–32)
AST: 20 IU/L (ref 0–40)
Albumin/Globulin Ratio: 2.2 (ref 1.2–2.2)
Albumin: 4.6 g/dL (ref 3.6–4.6)
Alkaline Phosphatase: 78 IU/L (ref 44–121)
BUN/Creatinine Ratio: 17 (ref 12–28)
BUN: 15 mg/dL (ref 10–36)
Bilirubin Total: 0.5 mg/dL (ref 0.0–1.2)
CO2: 24 mmol/L (ref 20–29)
Calcium: 9.7 mg/dL (ref 8.7–10.3)
Chloride: 102 mmol/L (ref 96–106)
Creatinine, Ser: 0.87 mg/dL (ref 0.57–1.00)
Globulin, Total: 2.1 g/dL (ref 1.5–4.5)
Glucose: 150 mg/dL — ABNORMAL HIGH (ref 70–99)
Potassium: 4.5 mmol/L (ref 3.5–5.2)
Sodium: 139 mmol/L (ref 134–144)
Total Protein: 6.7 g/dL (ref 6.0–8.5)
eGFR: 61 mL/min/{1.73_m2} (ref >59)

## 2022-07-02 LAB — T4, FREE: Free T4: 1.37 ng/dL (ref 0.82–1.77)

## 2022-07-02 LAB — TSH: TSH: 1.77 u[IU]/mL (ref 0.450–4.500)

## 2022-07-08 ENCOUNTER — Telehealth: Payer: Self-pay | Admitting: Pharmacist

## 2022-07-08 NOTE — Telephone Encounter (Signed)
Schedule with pharmd for med assistance

## 2022-07-13 ENCOUNTER — Ambulatory Visit (INDEPENDENT_AMBULATORY_CARE_PROVIDER_SITE_OTHER): Payer: Medicare Other | Admitting: *Deleted

## 2022-07-13 DIAGNOSIS — I152 Hypertension secondary to endocrine disorders: Secondary | ICD-10-CM

## 2022-07-13 DIAGNOSIS — E1122 Type 2 diabetes mellitus with diabetic chronic kidney disease: Secondary | ICD-10-CM

## 2022-07-13 NOTE — Chronic Care Management (AMB) (Signed)
Chronic Care Management   CCM RN Visit Note  07/13/2022 Name: Maria Alexander MRN: [id number] DOB: 1924/06/18  Subjective: Maria Alexander is a 89 y.o. year old female who is a primary care patient of Janora Norlander, DO. The patient was referred to the Chronic Care Management team for assistance with care management needs subsequent to provider initiation of CCM services and plan of care.    Today's Visit:  Engaged with patient by telephone for initial visit.     SDOH Interventions Today    Flowsheet Row Most Recent Value  SDOH Interventions   Food Insecurity Interventions Intervention Not Indicated  Housing Interventions Intervention Not Indicated  Transportation Interventions Intervention Not Indicated  Utilities Interventions Intervention Not Indicated  Financial Strain Interventions Intervention Not Indicated  Physical Activity Interventions Intervention Not Indicated  Stress Interventions Intervention Not Indicated  Social Connections Interventions Intervention Not Indicated         Goals Addressed             This Visit's Progress    CCM (DIABETES) EXPECTED OUTCOME:  MONITOR, SELF-MANAGE AND REDUCE SYMPTOMS OF DIABETES       Current Barriers:  Knowledge Deficits related to Diabetes management Chronic Disease Management support and education needs related to Diabetes, diet Patient reports she had not been following a special diet but is now "trying to do better and not eat so many sweets"  Patient reports she loves sweets and this has been an issue for her. Patient reports she does not monitor CBG and has not been instructed to.  Planned Interventions: Provided education to patient about basic DM disease process; Reviewed medications with patient and discussed importance of medication adherence;        Reviewed prescribed diet with patient carbohydrate modified; Counseled on importance of regular laboratory monitoring as prescribed;        Discussed plans with  patient for ongoing care management follow up and provided patient with direct contact information for care management team;      Provided patient with written educational materials related to hypo and hyperglycemia and importance of correct treatment;       Screening for signs and symptoms of depression related to chronic disease state;        Assessed social determinant of health barriers;        Reviewed importance of limiting/ avoiding concentrated sweets  Symptom Management: Take medications as prescribed   Attend all scheduled provider appointments Call pharmacy for medication refills 3-7 days in advance of running out of medications Attend church or other social activities Perform all self care activities independently  check feet daily for cuts, sores or redness trim toenails straight across drink 6 to 8 glasses of water each day fill half of plate with vegetables limit fast food meals to no more than 1 per week manage portion size prepare main meal at home 3 to 5 days each week read food labels for fat, fiber, carbohydrates and portion size Look over education sent via my chart- hypoglycemia Please avoid concentrated sweets (cakes, cookies, pies etc)  Follow Up Plan: Telephone follow up appointment with care management team member scheduled for:  08/25/22 at 3 pm       CCM (HYPERTENSION) EXPECTED OUTCOME: MONITOR, SELF-MANAGE AND REDUCE SYMPTOMS OF HYPERTENSION       Current Barriers:  Knowledge Deficits related to Hypertension management Chronic Disease Management support and education needs related to Hypertension, diet Patient reports she lives alone, has daughter nearby  that assists as needed.  Patient reports she does have blood pressure cuff but does not monitor blood pressure at home. Patient reports she was exercising at [facility] until her hospitalization 04/19/22 for UTI.  Patient states she plans to hopefully start back doing some type of exercise. Patient reports she is  trying to drink mostly water now, drinks one diet drink daily With medication review, pt states she is not taking metoprolol and does not have on hand  Planned Interventions: Evaluation of current treatment plan related to hypertension self management and patient's adherence to plan as established by provider;   Reviewed prescribed diet low sodium Reviewed medications with patient and discussed importance of compliance;  Counseled on the importance of exercise goals with target of 150 minutes per week Discussed plans with patient for ongoing care management follow up and provided patient with direct contact information for care management team; Advised patient, providing education and rationale, to monitor blood pressure daily and record, calling PCP for findings outside established parameters;  Advised patient to discuss any issues with blood pressure, medications with provider; Provided education on prescribed diet low sodium;  Discussed complications of poorly controlled blood pressure such as heart disease, stroke, circulatory complications, vision complications, kidney impairment, sexual dysfunction;  Screening for signs and symptoms of depression related to chronic disease state;  Assessed social determinant of health barriers;  Reviewed importance of drinking adequate fluids, preferably water Reviewed signs/ symptoms UTI and correlation of elevated CBG with infection In basket message sent to primary care provider inquiring as to whether pt is supposed to be taking metoprolol  Symptom Management: Take medications as prescribed   Attend all scheduled provider appointments Call pharmacy for medication refills 3-7 days in advance of running out of medications Attend church or other social activities Perform all self care activities independently  check blood pressure weekly choose a place to take my blood pressure (home, clinic or office, retail store) write blood pressure results in a  log or diary keep a blood pressure log take blood pressure log to all doctor appointments keep all doctor appointments take medications for blood pressure exactly as prescribed report new symptoms to your doctor eat more whole grains, fruits and vegetables, lean meats and healthy fats Look over education sent via my chart- low sodium diet Continue drinking adequate fluids, preferably water Please let someone know if you feel like you have any symptoms of urinary tract infection (burning, frequency, pain) Message was sent to your doctor asking if you are supposed to be taking metoprolol- per our conversation today you report you are not taking  Follow Up Plan: Telephone follow up appointment with care management team member scheduled for:  08/25/22 at 3 pm          Plan:Telephone follow up appointment with care management team member scheduled for:  08/25/22 at 3 pm  Jacqlyn Larsen [facility], BSN RN Case Manager [place] [phone]

## 2022-07-13 NOTE — Plan of Care (Signed)
Chronic Care Management Provider Comprehensive Care Plan    07/13/2022 Name: Maria Alexander MRN: [id number] DOB: 1923-07-26  Referral to Chronic Care Management (CCM) services was placed by Provider:  Ronnie Doss DO on Date: 07/01/22.  Chronic Condition 1: HYPERTENSION Provider Assessment and Plan Hypertension associated with diabetes ([place]) - Plan: CMP14+EGFR, AMB Referral to Chronic Care Management Services    Expected Outcome/Goals Addressed This Visit (Provider CCM goals/Provider Assessment and plan  CCM (HYPERTENSION) EXPECTED OUTCOME: MONITOR, SELF-MANAGE AND REDUCE SYMPTOMS OF HYPERTENSION  Symptom Management Condition 1: Take medications as prescribed   Attend all scheduled provider appointments Call pharmacy for medication refills 3-7 days in advance of running out of medications Attend church or other social activities Perform all self care activities independently  check blood pressure weekly choose a place to take my blood pressure (home, clinic or office, retail store) write blood pressure results in a log or diary keep a blood pressure log take blood pressure log to all doctor appointments keep all doctor appointments take medications for blood pressure exactly as prescribed report new symptoms to your doctor eat more whole grains, fruits and vegetables, lean meats and healthy fats Look over education sent via my chart- low sodium diet Continue drinking adequate fluids, preferably water Please let someone know if you feel like you have any symptoms of urinary tract infection (burning, frequency, pain) Message was sent to your doctor asking if you are supposed to be taking metoprolol- per our conversation today you report you are not taking  Chronic Condition 2: DIABETES Provider Assessment and Plan Type 2 diabetes mellitus with stage 3a chronic kidney disease, without long-term current use of insulin ([place]) - Plan: Bayer DCA Hb A1c Waived, CMP14+EGFR, AMB Referral to  Chronic Care Management Services, saxagliptin HCl (ONGLYZA) 5 MG TABS tablet    Expected Outcome/Goals Addressed This Visit (Provider CCM goals/Provider Assessment and plan  CCM (DIABETES) EXPECTED OUTCOME:  MONITOR, SELF-MANAGE AND REDUCE SYMPTOMS OF DIABETES  Symptom Management Condition 2: Take medications as prescribed   Attend all scheduled provider appointments Call pharmacy for medication refills 3-7 days in advance of running out of medications Attend church or other social activities Perform all self care activities independently  check feet daily for cuts, sores or redness trim toenails straight across drink 6 to 8 glasses of water each day fill half of plate with vegetables limit fast food meals to no more than 1 per week manage portion size prepare main meal at home 3 to 5 days each week read food labels for fat, fiber, carbohydrates and portion size Look over education sent via my chart- hypoglycemia Please avoid concentrated sweets (cakes, cookies, pies etc)  Problem List Patient Active Problem List   Diagnosis Date Noted   Melanoma of skin ([place]) 10/27/2021   GERD (gastroesophageal reflux disease) 10/07/2021   Benign esophageal stricture 10/07/2021   Cellulitis of right leg 09/22/2021   Murmur, cardiac 09/22/2021   Non-healing ulcer of foot ([place]) 09/22/2021   Thrombocytopenia ([place]) 09/22/2021   Stage 3a chronic kidney disease (CKD) ([place]) 09/22/2021   Esophageal dysphagia 03/04/2021   History of esophageal stricture 03/04/2021   Hyperlipidemia associated with type 2 diabetes mellitus ([place]) 12/15/2019   Pain in thoracic spine 04/25/2019   Lumbar pain 04/25/2019   Hypertension associated with diabetes ([place]) 12/09/2018   Anxiety 11/29/2017   Chronic hoarseness 01/15/2017   Hypothyroidism 06/06/2014   Uncontrolled type 2 diabetes mellitus with hyperglycemia, without long-term current use of insulin ([place]) 06/06/2014  Medication Management  Current Outpatient  Medications:    ALPRAZolam (XANAX) 1 MG tablet, Take 0.5-1 tablets (0.5-1 mg total) by mouth at bedtime as needed for anxiety., Disp: 30 tablet, Rfl: 3   atorvastatin (LIPITOR) 80 MG tablet, TAKE (1/2) TABLET DAILY., Disp: 90 tablet, Rfl: 0   levothyroxine (SYNTHROID) 75 MCG tablet, Take 75 mcg by mouth daily before breakfast., Disp: , Rfl:    losartan (COZAAR) 100 MG tablet, Take 100 mg by mouth daily., Disp: , Rfl:    saxagliptin HCl (ONGLYZA) 5 MG TABS tablet, Take 1 tablet (5 mg total) by mouth daily. PAP w/ Maria Alexander/ CCM, Disp: 90 tablet, Rfl: 3   metoprolol tartrate (LOPRESSOR) 25 MG tablet, Take 25 mg by mouth 2 (two) times daily. (Patient not taking: Reported on 07/13/2022), Disp: , Rfl:    Multiple Vitamin (MULTIVITAMIN) tablet, Take 1 tablet by mouth daily. Centrum Women's 50+ (Patient not taking: Reported on 07/01/2022), Disp: , Rfl:   Cognitive Assessment Identity Confirmed: : Name; DOB Cognitive Status: Normal   Functional Assessment Hearing Difficulty or Deaf: yes Hearing Management: using Debrox drops for wax buildup Wear Glasses or Blind: yes Vision Management: can see well w/ glasses Concentrating, Remembering or Making Decisions Difficulty (CP): no Difficulty Communicating: no Difficulty Eating/Swallowing: no Walking or Climbing Stairs Difficulty: no Dressing/Bathing Difficulty: no Doing Errands Independently Difficulty (such as shopping) (CP): yes Errands Management: pt drives around town, but not out of town   Health and safety inspector Source of Support/Comfort: child(ren) Name of Support/Comfort Primary Source: daughter Maria Alexander People in Home: alone   Planned Interventions  Evaluation of current treatment plan related to hypertension self management and patient's adherence to plan as established by provider;   Reviewed prescribed diet low sodium Reviewed medications with patient and discussed importance of compliance;  Counseled on the importance of  exercise goals with target of 150 minutes per week Discussed plans with patient for ongoing care management follow up and provided patient with direct contact information for care management team; Advised patient, providing education and rationale, to monitor blood pressure daily and record, calling PCP for findings outside established parameters;  Advised patient to discuss any issues with blood pressure, medications with provider; Provided education on prescribed diet low sodium;  Discussed complications of poorly controlled blood pressure such as heart disease, stroke, circulatory complications, vision complications, kidney impairment, sexual dysfunction;  Screening for signs and symptoms of depression related to chronic disease state;  Assessed social determinant of health barriers;  Reviewed importance of drinking adequate fluids, preferably water Reviewed signs/ symptoms UTI and correlation of elevated CBG with infection In basket message sent to primary care provider inquiring as to whether pt is supposed to be taking metoprolol Provided education to patient about basic DM disease process; Reviewed medications with patient and discussed importance of medication adherence;        Reviewed prescribed diet with patient carbohydrate modified; Counseled on importance of regular laboratory monitoring as prescribed;        Discussed plans with patient for ongoing care management follow up and provided patient with direct contact information for care management team;      Provided patient with written educational materials related to hypo and hyperglycemia and importance of correct treatment;       Screening for signs and symptoms of depression related to chronic disease state;        Assessed social determinant of health barriers;        Reviewed importance of limiting/  avoiding concentrated sweets  Interaction and coordination with outside resources, practitioners, and providers See CCM  Referral  Care Plan: Available in MyChart

## 2022-07-13 NOTE — Patient Instructions (Signed)
Please call the care guide team at [phone] if you need to cancel or reschedule your appointment.   If you are experiencing a Mental Health or [place] or need someone to talk to, please call the Suicide and Crisis Lifeline: 988 call the [place] National Suicide Prevention Lifeline: 307-658-7980 or TTY: (878) 861-0534 TTY (253)460-2970) to talk to a trained counselor call 1-800-273-TALK (toll free, 24 hour hotline) go to [facility] [address] [place] [phone]) call the [facility]: [phone] call 911   Following is a copy of the CCM Program Consent:  CCM service includes personalized support from designated clinical staff supervised by the physician, including individualized plan of care and coordination with other care providers 24/7 contact phone numbers for assistance for urgent and routine care needs. Service will only be billed when office clinical staff spend 20 minutes or more in a month to coordinate care. Only one practitioner may furnish and bill the service in a calendar month. The patient may stop CCM services at amy time (effective at the end of the month) by phone call to the office staff. The patient will be responsible for cost sharing (co-pay) or up to 20% of the service fee (after annual deductible is met)  Following is a copy of your full provider care plan:   Goals Addressed             This Visit's Progress    CCM (DIABETES) EXPECTED OUTCOME:  MONITOR, SELF-MANAGE AND REDUCE SYMPTOMS OF DIABETES       Current Barriers:  Knowledge Deficits related to Diabetes management Chronic Disease Management support and education needs related to Diabetes, diet Patient reports she had not been following a special diet but is now "trying to do better and not eat so many sweets"  Patient reports she loves sweets and this has been an issue for her. Patient reports she does not monitor CBG and  has not been instructed to.  Planned Interventions: Provided education to patient about basic DM disease process; Reviewed medications with patient and discussed importance of medication adherence;        Reviewed prescribed diet with patient carbohydrate modified; Counseled on importance of regular laboratory monitoring as prescribed;        Discussed plans with patient for ongoing care management follow up and provided patient with direct contact information for care management team;      Provided patient with written educational materials related to hypo and hyperglycemia and importance of correct treatment;       Screening for signs and symptoms of depression related to chronic disease state;        Assessed social determinant of health barriers;        Reviewed importance of limiting/ avoiding concentrated sweets  Symptom Management: Take medications as prescribed   Attend all scheduled provider appointments Call pharmacy for medication refills 3-7 days in advance of running out of medications Attend church or other social activities Perform all self care activities independently  check feet daily for cuts, sores or redness trim toenails straight across drink 6 to 8 glasses of water each day fill half of plate with vegetables limit fast food meals to no more than 1 per week manage portion size prepare main meal at home 3 to 5 days each week read food labels for fat, fiber, carbohydrates and portion size Look over education sent via my chart- hypoglycemia Please avoid concentrated sweets (cakes, cookies, pies etc)  Follow  Up Plan: Telephone follow up appointment with care management team member scheduled for:  08/25/22 at 3 pm       CCM (HYPERTENSION) EXPECTED OUTCOME: MONITOR, SELF-MANAGE AND REDUCE SYMPTOMS OF HYPERTENSION       Current Barriers:  Knowledge Deficits related to Hypertension management Chronic Disease Management support and education needs related to  Hypertension, diet Patient reports she lives alone, has daughter nearby that assists as needed.  Patient reports she does have blood pressure cuff but does not monitor blood pressure at home. Patient reports she was exercising at [facility] until her hospitalization 04/19/22 for UTI.  Patient states she plans to hopefully start back doing some type of exercise. Patient reports she is trying to drink mostly water now, drinks one diet drink daily With medication review, pt states she is not taking metoprolol and does not have on hand  Planned Interventions: Evaluation of current treatment plan related to hypertension self management and patient's adherence to plan as established by provider;   Reviewed prescribed diet low sodium Reviewed medications with patient and discussed importance of compliance;  Counseled on the importance of exercise goals with target of 150 minutes per week Discussed plans with patient for ongoing care management follow up and provided patient with direct contact information for care management team; Advised patient, providing education and rationale, to monitor blood pressure daily and record, calling PCP for findings outside established parameters;  Advised patient to discuss any issues with blood pressure, medications with provider; Provided education on prescribed diet low sodium;  Discussed complications of poorly controlled blood pressure such as heart disease, stroke, circulatory complications, vision complications, kidney impairment, sexual dysfunction;  Screening for signs and symptoms of depression related to chronic disease state;  Assessed social determinant of health barriers;  Reviewed importance of drinking adequate fluids, preferably water Reviewed signs/ symptoms UTI and correlation of elevated CBG with infection In basket message sent to primary care provider inquiring as to whether pt is supposed to be taking metoprolol  Symptom Management: Take medications  as prescribed   Attend all scheduled provider appointments Call pharmacy for medication refills 3-7 days in advance of running out of medications Attend church or other social activities Perform all self care activities independently  check blood pressure weekly choose a place to take my blood pressure (home, clinic or office, retail store) write blood pressure results in a log or diary keep a blood pressure log take blood pressure log to all doctor appointments keep all doctor appointments take medications for blood pressure exactly as prescribed report new symptoms to your doctor eat more whole grains, fruits and vegetables, lean meats and healthy fats Look over education sent via my chart- low sodium diet Continue drinking adequate fluids, preferably water Please let someone know if you feel like you have any symptoms of urinary tract infection (burning, frequency, pain) Message was sent to your doctor asking if you are supposed to be taking metoprolol- per our conversation today you report you are not taking  Follow Up Plan: Telephone follow up appointment with care management team member scheduled for:  08/25/22 at 3 pm          Patient verbalizes understanding of instructions and care plan provided today and agrees to view in [place]. Active MyChart status and patient understanding of how to access instructions and care plan via MyChart confirmed with patient.     Telephone follow up appointment with care management team member scheduled for:  08/25/22 at 3 pm  Low-Sodium Eating Plan Sodium, which is an element that makes up salt, helps you maintain a healthy balance of fluids in your body. Too much sodium can increase your blood pressure and cause fluid and waste to be held in your body. Your health care provider or dietitian may recommend following this plan if you have high blood pressure (hypertension), kidney disease, liver disease, or heart failure. Eating less sodium can  help lower your blood pressure, reduce swelling, and protect your heart, liver, and kidneys. What are tips for following this plan? Reading food labels The Nutrition Facts label lists the amount of sodium in one serving of the food. If you eat more than one serving, you must multiply the listed amount of sodium by the number of servings. Choose foods with less than 140 mg of sodium per serving. Avoid foods with 300 mg of sodium or more per serving. Shopping  Look for lower-sodium products, often labeled as "low-sodium" or "no salt added." Always check the sodium content, even if foods are labeled as "unsalted" or "no salt added." Buy fresh foods. Avoid canned foods and pre-made or frozen meals. Avoid canned, cured, or processed meats. Buy breads that have less than 80 mg of sodium per slice. Cooking  Eat more home-cooked food and less restaurant, buffet, and fast food. Avoid adding salt when cooking. Use salt-free seasonings or herbs instead of table salt or sea salt. Check with your health care provider or pharmacist before using salt substitutes. Cook with plant-based oils, such as canola, sunflower, or olive oil. Meal planning When eating at a restaurant, ask that your food be prepared with less salt or no salt, if possible. Avoid dishes labeled as brined, pickled, cured, smoked, or made with soy sauce, miso, or teriyaki sauce. Avoid foods that contain MSG (monosodium glutamate). MSG is sometimes added to [place] food, bouillon, and some canned foods. Make meals that can be grilled, baked, poached, roasted, or steamed. These are generally made with less sodium. General information Most people on this plan should limit their sodium intake to 1,500-2,000 mg (milligrams) of sodium each day. What foods should I eat? Fruits Fresh, frozen, or canned fruit. Fruit juice. Vegetables Fresh or frozen vegetables. "No salt added" canned vegetables. "No salt added" tomato sauce and paste.  Low-sodium or reduced-sodium tomato and vegetable juice. Grains Low-sodium cereals, including oats, puffed wheat and rice, and shredded wheat. Low-sodium crackers. Unsalted rice. Unsalted pasta. Low-sodium bread. Whole-grain breads and whole-grain pasta. Meats and other proteins Fresh or frozen (no salt added) meat, poultry, seafood, and fish. Low-sodium canned tuna and salmon. Unsalted nuts. Dried peas, beans, and lentils without added salt. Unsalted canned beans. Eggs. Unsalted nut butters. Dairy Milk. Soy milk. Cheese that is naturally low in sodium, such as ricotta cheese, fresh mozzarella, or Swiss cheese. Low-sodium or reduced-sodium cheese. Cream cheese. Yogurt. Seasonings and condiments Fresh and dried herbs and spices. Salt-free seasonings. Low-sodium mustard and ketchup. Sodium-free salad dressing. Sodium-free light mayonnaise. Fresh or refrigerated horseradish. Lemon juice. Vinegar. Other foods Homemade, reduced-sodium, or low-sodium soups. Unsalted popcorn and pretzels. Low-salt or salt-free chips. The items listed above may not be a complete list of foods and beverages you can eat. Contact a dietitian for more information. What foods should I avoid? Vegetables Sauerkraut, pickled vegetables, and relishes. Olives. [place] fries. Onion rings. Regular canned vegetables (not low-sodium or reduced-sodium). Regular canned tomato sauce and paste (not low-sodium or reduced-sodium). Regular tomato and vegetable juice (not low-sodium or reduced-sodium). Frozen vegetables in sauces. Grains  Instant hot cereals. Bread stuffing, pancake, and biscuit mixes. Croutons. Seasoned rice or pasta mixes. Noodle soup cups. Boxed or frozen macaroni and cheese. Regular salted crackers. Self-rising flour. Meats and other proteins Meat or fish that is salted, canned, smoked, spiced, or pickled. Precooked or cured meat, such as sausages or meat loaves. Berniece Salines. Ham. Pepperoni. Hot dogs. Corned beef. Chipped beef.  Salt pork. Jerky. Pickled herring. Anchovies and sardines. Regular canned tuna. Salted nuts. Dairy Processed cheese and cheese spreads. Hard cheeses. Cheese curds. Blue cheese. Feta cheese. String cheese. Regular cottage cheese. Buttermilk. Canned milk. Fats and oils Salted butter. Regular margarine. Ghee. Bacon fat. Seasonings and condiments Onion salt, garlic salt, seasoned salt, table salt, and sea salt. Canned and packaged gravies. Worcestershire sauce. Tartar sauce. Barbecue sauce. Teriyaki sauce. Soy sauce, including reduced-sodium. Steak sauce. Fish sauce. Oyster sauce. Cocktail sauce. Horseradish that you find on the shelf. Regular ketchup and mustard. Meat flavorings and tenderizers. Bouillon cubes. Hot sauce. Pre-made or packaged marinades. Pre-made or packaged taco seasonings. Relishes. Regular salad dressings. Salsa. Other foods Salted popcorn and pretzels. Corn chips and puffs. Potato and tortilla chips. Canned or dried soups. Pizza. Frozen entrees and pot pies. The items listed above may not be a complete list of foods and beverages you should avoid. Contact a dietitian for more information. Summary Eating less sodium can help lower your blood pressure, reduce swelling, and protect your heart, liver, and kidneys. Most people on this plan should limit their sodium intake to 1,500-2,000 mg (milligrams) of sodium each day. Canned, boxed, and frozen foods are high in sodium. Restaurant foods, fast foods, and pizza are also very high in sodium. You also get sodium by adding salt to food. Try to cook at home, eat more fresh fruits and vegetables, and eat less fast food and canned, processed, or prepared foods. This information is not intended to replace advice given to you by your health care provider. Make sure you discuss any questions you have with your health care provider. Document Revised: 07/21/2019 Document Reviewed: 05/17/2019 Elsevier Patient Education  [place]. Hypoglycemia Hypoglycemia is when the sugar (glucose) level in your blood is too low. Low blood sugar can happen to people who have diabetes and people who do not have diabetes. Low blood sugar can happen quickly, and it can be an emergency. What are the causes? This condition happens most often in people who have diabetes. It may be caused by: Diabetes medicine. Not eating enough, or not eating often enough. Doing more physical activity. Drinking alcohol on an empty stomach. If you do not have diabetes, this condition may be caused by: A tumor in the pancreas. Not eating enough, or not eating for long periods at a time (fasting). A very bad infection or illness. Problems after having weight loss (bariatric) surgery. Kidney failure or liver failure. Certain medicines. What increases the risk? This condition is more likely to develop in people who: Have diabetes and take medicines to lower their blood sugar. Abuse alcohol. Have a very bad illness. What are the signs or symptoms? Mild Hunger. Sweating and feeling clammy. Feeling dizzy or light-headed. Being sleepy or having trouble sleeping. Feeling like you may vomit (nauseous). A fast heartbeat. A headache. Blurry vision. Mood changes, such as: Being grouchy. Feeling worried or nervous (anxious). Tingling or loss of feeling (numbness) around your mouth, lips, or tongue. Moderate Confusion and poor judgment. Behavior changes. Weakness. Uneven heartbeat. Trouble with moving (coordination). Very low Very low blood sugar (  severe hypoglycemia) is a medical emergency. It can cause: Fainting. Seizures. Loss of consciousness (coma). Death. How is this treated? Treating low blood sugar Low blood sugar is often treated by eating or drinking something that has sugar in it right away. The food or drink should contain 15 grams of a fast-acting carb (carbohydrate). Options include: 4 oz (120 mL) of fruit juice. 4 oz (120 mL)  of regular soda (not diet soda). A few pieces of hard candy. Check food labels to see how many pieces to eat for 15 grams. 1 Tbsp (15 mL) of sugar or honey. 4 glucose tablets. 1 tube of glucose gel. Treating low blood sugar if you have diabetes If you can think clearly and swallow safely, follow the 15:15 rule: Take 15 grams of a fast-acting carb. Talk with your doctor about how much you should take. Always keep a source of fast-acting carb with you, such as: Glucose tablets (take 4 tablets). A few pieces of hard candy. Check food labels to see how many pieces to eat for 15 grams. 4 oz (120 mL) of fruit juice. 4 oz (120 mL) of regular soda (not diet soda). 1 Tbsp (15 mL) of honey or sugar. 1 tube of glucose gel. Check your blood sugar 15 minutes after you take the carb. If your blood sugar is still at or below 70 mg/dL (3.9 mmol/L), take 15 grams of a carb again. If your blood sugar does not go above 70 mg/dL (3.9 mmol/L) after 3 tries, get help right away. After your blood sugar goes back to normal, eat a meal or a snack within 1 hour.  Treating very low blood sugar If your blood sugar is below 54 mg/dL (3 mmol/L), you have very low blood sugar, or severe hypoglycemia. This is an emergency. Get medical help right away. If you have very low blood sugar and you cannot eat or drink, you will need to be given a hormone called glucagon. A family member or friend should learn how to check your blood sugar and how to give you glucagon. Ask your doctor if you need to have an emergency glucagon kit at home. Very low blood sugar may also need to be treated in a hospital. Follow these instructions at home: General instructions Take over-the-counter and prescription medicines only as told by your doctor. Stay aware of your blood sugar as told by your doctor. If you drink alcohol: Limit how much you have to: 0-1 drink a day for women who are not pregnant. 0-2 drinks a day for men. Know how much  alcohol is in your drink. In the [place] one drink equals one 12 oz bottle of beer (355 mL), one 5 oz glass of wine (148 mL), or one 1 oz glass of hard liquor (44 mL). Be sure to eat food when you drink alcohol. Know that your body absorbs alcohol quickly. This may lead to low blood sugar later. Be sure to keep checking your blood sugar. Keep all follow-up visits. If you have diabetes:  Always have a fast-acting carb (15 grams) with you to treat low blood sugar. Follow your diabetes care plan as told by your doctor. Make sure you: Know the symptoms of low blood sugar. Check your blood sugar as often as told. Always check it before and after exercise. Always check your blood sugar before you drive. Take your medicines as told. Follow your meal plan. Eat on time. Do not skip meals. Share your diabetes care plan with: Your work  or school. People you live with. Carry a card or wear jewelry that says you have diabetes. Where to find more information American Diabetes Association: www.diabetes.org Contact a doctor if: You have trouble keeping your blood sugar in your target range. You have low blood sugar often. Get help right away if: You still have symptoms after you eat or drink something that contains 15 grams of fast-acting carb, and you cannot get your blood sugar above 70 mg/dL by following the 15:15 rule. Your blood sugar is below 54 mg/dL (3 mmol/L). You have a seizure. You faint. These symptoms may be an emergency. Get help right away. Call your local emergency services (911 in the [place] Do not wait to see if the symptoms will go away. Do not drive yourself to the hospital. Summary Hypoglycemia happens when the level of sugar (glucose) in your blood is too low. Low blood sugar can happen to people who have diabetes and people who do not have diabetes. Low blood sugar can happen quickly, and it can be an emergency. Make sure you know the symptoms of low blood sugar and know how  to treat it. Always keep a source of sugar (fast-acting carb) with you to treat low blood sugar. This information is not intended to replace advice given to you by your health care provider. Make sure you discuss any questions you have with your health care provider. Document Revised: 05/16/2020 Document Reviewed: 05/16/2020 Elsevier Patient Education  [place].

## 2022-07-18 ENCOUNTER — Other Ambulatory Visit: Payer: Self-pay | Admitting: Family Medicine

## 2022-07-19 NOTE — Telephone Encounter (Signed)
Last office visit 07/01/22 Medication is listed on med list as historical, per protocol requires approval by provider

## 2022-07-28 ENCOUNTER — Ambulatory Visit: Payer: Medicare Other | Admitting: Pharmacist

## 2022-07-28 DIAGNOSIS — E1169 Type 2 diabetes mellitus with other specified complication: Secondary | ICD-10-CM

## 2022-07-28 DIAGNOSIS — E1165 Type 2 diabetes mellitus with hyperglycemia: Secondary | ICD-10-CM

## 2022-07-28 NOTE — Progress Notes (Signed)
Chronic Care Management Pharmacy Note  08/26/2021 Name:  Maria Alexander MRN:  [id number] DOB:  [birth year]  Summary: T2DM  Recommendations/Changes made from today's visit:  Diabetes: Goal on Track (progressing): YES. Controlled-A1C 7.8%; current treatment: ONGLYZA 5MG DAILY [name] REPLACED WITH ONGLYZA 5MG DAILY PATIENT TOLERATING WELL Receives Onglyza through [place] patient assistance  ESCRIBED TO [place] Per patient, she received a letter that her patient assistance was good until 12/27/2022 Intolerant to metformin in the past, unclear if she was on XR formulation or not. eGFR 61 on 07/01/2022. If A1c not at goal at next follow up with PCP in April, may consider retrial of metformin  Glipizide discontinued during hospital admission in 03/2022   Subjective: Maria Alexander is an 89 y.o. year old female who is a primary patient of Janora Norlander, DO.  The CCM team was consulted for assistance with disease management and care coordination needs.    Engaged with patient by telephone for follow up visit in response to provider referral for pharmacy case management and/or care coordination services.   Consent to Services:  The patient was given information about Chronic Care Management services, agreed to services, and gave verbal consent prior to initiation of services.  Please see initial visit note for detailed documentation.   Patient Care Team: Janora Norlander, DO as PCP - General (Family Medicine) Lavera Guise, [facility] as Pharmacist (Family Medicine) Kassie Mends, RN as [place] Management  Objective:  Lab Results  Component Value Date   CREATININE 0.87 07/01/2022   CREATININE 0.93 02/27/2022   CREATININE 0.98 09/21/2021    Lab Results  Component Value Date   HGBA1C 7.8 (H) 07/01/2022   Last diabetic Eye exam:  Lab Results  Component Value Date/Time   HMDIABEYEEXA No Retinopathy 06/01/2022 12:00 AM    Last  diabetic Foot exam: No results found for: "HMDIABFOOTEX"      Component Value Date/Time   CHOL 141 02/27/2022 0950   TRIG 108 02/27/2022 0950   HDL 44 02/27/2022 0950   CHOLHDL 3.2 02/27/2022 0950   LDLCALC 77 02/27/2022 0950       Latest Ref Rng & Units 07/01/2022   10:18 AM 02/27/2022    9:50 AM 05/14/2021   10:45 AM  Hepatic Function  Total Protein 6.0 - 8.5 g/dL 6.7  6.3  6.2   Albumin 3.6 - 4.6 g/dL 4.6  4.0  4.5   AST 0 - 40 IU/L 20  26  26   $ ALT 0 - 32 IU/L 20  26  25   $ Alk Phosphatase 44 - 121 IU/L 78  86  71   Total Bilirubin 0.0 - 1.2 mg/dL 0.5  0.6  0.6     Lab Results  Component Value Date/Time   TSH 1.770 07/01/2022 10:18 AM   TSH 2.810 10/27/2021 10:15 AM   FREET4 1.37 07/01/2022 10:18 AM   FREET4 1.41 10/27/2021 10:15 AM       Latest Ref Rng & Units 09/21/2021    9:04 PM 09/13/2019    9:09 AM 12/09/2018    8:56 AM  CBC  WBC 4.0 - 10.5 K/uL 6.6  5.7  5.5   Hemoglobin 12.0 - 15.0 g/dL 12.0  13.1  12.2   Hematocrit 36.0 - 46.0 % 35.4  38.9  38.2   Platelets 150 - 400 K/uL 135  142  157     No results found for: "VD25OH"  Clinical ASCVD:  No  The ASCVD Risk score (Arnett DK, et al., 2019) failed to calculate for the following reasons:   The 2019 ASCVD risk score is only valid for ages 70 to 49    Other: (CHADS2VASc if Afib, PHQ9 if depression, MMRC or CAT for COPD, ACT, DEXA)  Social History   Tobacco Use  Smoking Status Former   Types: Cigarettes   Quit date: 02/05/1995   Years since quitting: 27.4   Passive exposure: Past  Smokeless Tobacco Never   BP Readings from Last 3 Encounters:  07/01/22 127/73  05/07/22 (!) 171/66  02/27/22 (!) 143/75   Pulse Readings from Last 3 Encounters:  07/01/22 76  05/07/22 (!) 47  02/27/22 66   Wt Readings from Last 3 Encounters:  07/01/22 120 lb 9.6 oz (54.7 kg)  05/07/22 121 lb (54.9 kg)  02/27/22 125 lb (56.7 kg)    Assessment: Review of patient past medical history, allergies, medications, health  status, including review of consultants reports, laboratory and other test data, was performed as part of comprehensive evaluation and provision of chronic care management services.   SDOH:  (Social Determinants of Health) assessments and interventions performed:  SDOH Interventions    Flowsheet Row Chronic Care Management from 07/13/2022 in [facility] Clinical Support from 08/05/2021 in [facility]  SDOH Interventions    Food Insecurity Interventions Intervention Not Indicated Intervention Not Indicated  Housing Interventions Intervention Not Indicated Intervention Not Indicated  Transportation Interventions Intervention Not Indicated Intervention Not Indicated  Utilities Interventions Intervention Not Indicated --  Financial Strain Interventions Intervention Not Indicated Intervention Not Indicated  Physical Activity Interventions Intervention Not Indicated Intervention Not Indicated  Stress Interventions Intervention Not Indicated Intervention Not Indicated  Social Connections Interventions Intervention Not Indicated Intervention Not Indicated       CCM Care Plan  Allergies  Allergen Reactions   Shellfish Allergy Hives    Medications Reviewed Today     Reviewed by Kassie Mends, RN (Registered Nurse) on 07/13/22 at [place] List Status: <None>   Medication Order Taking? Sig Documenting Provider Last Dose Status Informant  ALPRAZolam (XANAX) 1 MG tablet [id number] Yes Take 0.5-1 tablets (0.5-1 mg total) by mouth at bedtime as needed for anxiety. Ronnie Doss M, DO Taking Active   atorvastatin (LIPITOR) 80 MG tablet [id number] Yes TAKE (1/2) TABLET DAILY. Ronnie Doss M, DO Taking Active   levothyroxine (SYNTHROID) 75 MCG tablet [id number] Yes Take 75 mcg by mouth daily before breakfast. [provider] Taking Active   losartan (COZAAR) 100 MG tablet [id number] Yes Take 100 mg by mouth daily.  [provider] Taking Active   metoprolol tartrate (LOPRESSOR) 25 MG tablet [id number] No Take 25 mg by mouth 2 (two) times daily.  Patient not taking: Reported on 07/13/2022   [provider] Not Taking Active   Multiple Vitamin (MULTIVITAMIN) tablet [id number] No Take 1 tablet by mouth daily. Centrum Women's 50+  Patient not taking: Reported on 07/01/2022   [provider] Not Taking Active Self  saxagliptin HCl (ONGLYZA) 5 MG TABS tablet [id number] Yes Take 1 tablet (5 mg total) by mouth daily. PAP w/ Julie/ CCM Janora Norlander, DO Taking Active             Patient Active Problem List   Diagnosis Date Noted   Melanoma of skin ([place]) 10/27/2021   GERD (gastroesophageal reflux disease) 10/07/2021   Benign esophageal stricture 10/07/2021   Cellulitis of  right leg 09/22/2021   Murmur, cardiac 09/22/2021   Non-healing ulcer of foot ([place]) 09/22/2021   Thrombocytopenia ([place]) 09/22/2021   Stage 3a chronic kidney disease (CKD) ([place]) 09/22/2021   Esophageal dysphagia 03/04/2021   History of esophageal stricture 03/04/2021   Hyperlipidemia associated with type 2 diabetes mellitus ([place]) 12/15/2019   Pain in thoracic spine 04/25/2019   Lumbar pain 04/25/2019   Hypertension associated with diabetes ([place]) 12/09/2018   Anxiety 11/29/2017   Chronic hoarseness 01/15/2017   Hypothyroidism 06/06/2014   Uncontrolled type 2 diabetes mellitus with hyperglycemia, without long-term current use of insulin ([place]) 06/06/2014    Immunization History  Administered Date(s) Administered   Covid-19, Mrna,Vaccine(Spikevax)80yr and older 07/06/2022   Fluad Quad(high Dose 65+) 04/01/2020   Influenza, High Dose Seasonal PF 04/04/2018   Influenza,inj,Quad PF,6+ Mos 03/31/2017   Influenza-Unspecified 04/07/2016, 04/04/2018, 02/28/2019, 03/27/2021, 04/13/2022   Moderna Sars-Covid-2 Vaccination 07/11/2019, 08/11/2019, 04/24/2020, 03/27/2021   Pneumococcal Conjugate-13 08/22/2014    Pneumococcal Polysaccharide-23 04/11/1997   Td 07/01/2022   Td (Adult),5 Lf Tetanus Toxid, Preservative Free 11/09/2007   Zoster Recombinat (Shingrix) 03/22/2017, 06/02/2017    Conditions to be addressed/monitored: HLD and DMII  There are no care plans that you recently modified to display for this patient.    Medication Assistance:  onglyza obtained through az&me medication assistance program.  Enrollment ends 12/27/22  Patient's preferred pharmacy is:  [id number]: [phone]: 3[place] [address] [place]Phone: [phone]: [phone]  Follow Up:  Patient agrees to Care Plan and Follow-up.  Plan: Telephone follow up appointment with care management team member scheduled for:  3 months   JRegina Eck PharmD, BCPS Clinical Pharmacist, [place] Phone [phone]

## 2022-07-29 DIAGNOSIS — I10 Essential (primary) hypertension: Secondary | ICD-10-CM

## 2022-07-29 DIAGNOSIS — E1159 Type 2 diabetes mellitus with other circulatory complications: Secondary | ICD-10-CM | POA: Diagnosis not present

## 2022-08-06 ENCOUNTER — Ambulatory Visit (INDEPENDENT_AMBULATORY_CARE_PROVIDER_SITE_OTHER): Payer: Medicare Other

## 2022-08-06 VITALS — Ht 60.0 in | Wt 125.0 lb

## 2022-08-06 DIAGNOSIS — Z0000 Encounter for general adult medical examination without abnormal findings: Secondary | ICD-10-CM | POA: Diagnosis not present

## 2022-08-06 NOTE — Progress Notes (Signed)
Subjective:   Maria Alexander is a 89 y.o. female who presents for Medicare Annual (Subsequent) preventive examination. I connected with  Maria Alexander on 08/06/22 by a audio enabled telemedicine application and verified that I am speaking with the correct person using two identifiers.  Patient Location: Home  Provider Location: Home Office  I discussed the limitations of evaluation and management by telemedicine. The patient expressed understanding and agreed to proceed.  Review of Systems     Cardiac Risk Factors include: advanced age (>22mn, >>22women);diabetes mellitus;dyslipidemia     Objective:    Today's Vitals   08/06/22 1316  Weight: 125 lb (56.7 kg)  Height: 5' (1.524 m)   Body mass index is 24.41 kg/m.     08/06/2022    1:19 PM 07/13/2022    2:47 PM 03/10/2022   12:20 PM 09/21/2021    2:16 PM 08/05/2021   11:35 AM 04/23/2021    7:55 AM 03/19/2021    8:22 AM  Advanced Directives  Does Patient Have a Medical Advance Directive? Yes Yes No No No  No  Type of AParamedicof APowers LakeLiving will [place]Is[place] Terrace[place] will    [place]  Does patient want to make changes to medical advance directive?  No - Patient declined       Copy of [place] Chart? No - copy requested No - copy requested    No - copy requested   Would patient like information on creating a medical advance directive?    No - Patient declined No - Patient declined  No - Patient declined    Current Medications (verified) Outpatient Encounter Medications as of 08/06/2022  Medication Sig   ALPRAZolam (XANAX) 1 MG tablet Take 0.5-1 tablets (0.5-1 mg total) by mouth at bedtime as needed for anxiety.   atorvastatin (LIPITOR) 80 MG tablet TAKE (1/2) TABLET DAILY.   levothyroxine (SYNTHROID) 75 MCG tablet Take 75 mcg by mouth daily before breakfast.   losartan (COZAAR) 100 MG tablet TAKE 1 TABLET DAILY   metoprolol tartrate  (LOPRESSOR) 25 MG tablet Take 25 mg by mouth 2 (two) times daily.   Multiple Vitamin (MULTIVITAMIN) tablet Take 1 tablet by mouth daily. Centrum Women's 50+   saxagliptin HCl (ONGLYZA) 5 MG TABS tablet Take 1 tablet (5 mg total) by mouth daily. PAP w/ Julie/ CCM   No facility-administered encounter medications on file as of 08/06/2022.    Allergies (verified) Shellfish allergy   History: Past Medical History:  Diagnosis Date   Diabetes ([place]    DVT (deep venous thrombosis) ([place]mp[place]    H/O blood clots 2000   High cholesterol    Hypothyroid    Stroke ([facility]    Past Surgical History:  Procedure Laterality Date   BIOPSY  04/23/2021   Procedure: BIOPSY;  Surgeon: RRogene [place] MD;  [facility];  Service: Endoscopy;;  esophageal   ESOPHAGEAL DILATION N/A 03/19/2021   Procedure: ESOPHAGEAL DILATION;  Surgeon: RRogene [place] MD;  Location: [facility];  Service: Endoscopy;  Laterality: N/A;   ESOPHAGOGASTRODUODENOSCOPY (EGD) WITH PROPOFOL N/A 03/19/2021   Procedure: ESOPHAGOGASTRODUODENOSCOPY (EGD) WITH PROPOFOL;  Surgeon: RRogene [place] MD;  Location: [facility];  Service: Endoscopy;  Laterality: N/A;  9:35   ESOPHAGOGASTRODUODENOSCOPY (EGD) WITH PROPOFOL N/A 04/23/2021   Procedure: ESOPHAGOGASTRODUODENOSCOPY (EGD) WITH PROPOFOL;  Surgeon: RRogene [place] MD;  Location: [facility];  Service: Endoscopy;  Laterality:  N/A;  8:30   NO PAST SURGERIES     Family History  Problem Relation Age of Onset   Cancer Mother    Heart disease Father        before age 60   Heart attack Father    Diabetes Sister    Diabetes Brother    Social History   Socioeconomic History   Marital status: Widowed    Spouse name: Not on file   Number of children: 3   Years of education: 10   Highest education level: 10th grade  Occupational History   Occupation: retired  Tobacco Use   Smoking status: Former    Types: Cigarettes    Quit date: 02/05/1995    Years since quitting:  27.5    Passive exposure: Past   Smokeless tobacco: Never  Vaping Use   Vaping Use: Never used  Substance and Sexual Activity   Alcohol use: No    Alcohol/week: 0.0 standard drinks of alcohol   Drug use: No   Sexual activity: Not Currently    Birth control/protection: None  Other Topics Concern   Not on file  Social History Narrative   Lives alone- has a basement, but doesn't use it   Daughter lives nearby   Social Determinants of Health   Financial Resource Strain: [place]  (08/06/2022)   Overall Financial Resource Strain (CARDIA)    Difficulty of Paying Living Expenses: Not hard at all  Food Insecurity: No [place] (08/06/2022)   Hunger Vital Sign    Worried About Running Out of Food in the Last Year: Never true    [place] in the Last Year: Never true  Transportation Needs: No Transportation Needs (08/06/2022)   PRAPARE - Hydrologist (Medical): No    Lack of Transportation (Non-Medical): No  Physical Activity: Inactive (08/06/2022)   Exercise Vital Sign    Days of Exercise per Week: 0 days    Minutes of Exercise per Session: 0 min  Stress: No Stress Concern Present (08/06/2022)   [place]    Feeling of Stress : Not at all  Social Connections: Moderately Isolated (08/06/2022)   Social Connection and Isolation Panel [NHANES]    Frequency of Communication with Friends and Family: More than three times a week    Frequency of Social Gatherings with Friends and Family: More than three times a week    Attends Religious Services: More than 4 times per year    Active Member of [employer] or Organizations: No    Attends Archivist Meetings: Never    Marital Status: Widowed    Tobacco Counseling Counseling given: Not Answered   Clinical Intake:  Pre-visit preparation completed: Yes  Pain : No/denies pain     Nutritional Risks: None Diabetes: No  How often do  you need to have someone help you when you read instructions, pamphlets, or other written materials from your doctor or pharmacy?: 1 - Never  Diabetic?yes   Interpreter Needed?: No  Information entered by :: Jadene Pierini, LPN   Activities of Daily Living    08/06/2022    1:20 PM 09/21/2021    8:48 PM  In your present state of health, do you have any difficulty performing the following activities:  Hearing? 0 0  Vision? 0 0  Difficulty concentrating or making decisions? 0 0  Walking or climbing stairs? 0 0  Dressing or bathing? 0  0  Doing errands, shopping? 0 0  Preparing Food and eating ? N   Using the Toilet? N   In the past six months, have you accidently leaked urine? N   Do you have problems with loss of bowel control? N   Managing your Medications? N   Managing your Finances? N   Housekeeping or managing your Housekeeping? N     Patient Care Team: Janora Norlander, DO as PCP - General (Family Medicine) Lavera Guise, [facility] as Pharmacist (Family Medicine) Kassie Mends, RN as [place] Management  Indicate any recent Medical Services you may have received from other than [facility] providers in the past year (date may be approximate).     Assessment:   This is a routine wellness examination for [place].  Hearing/Vision screen Vision Screening - Comments:: Wears rx glasses - up to date with routine eye exams with  Dr.groat  Dietary issues and exercise activities discussed: Current Exercise Habits: The patient does not participate in regular exercise at present   Goals Addressed               This Visit's Progress     DIET - INCREASE WATER INTAKE   On track     Try to drink 6-8 glasses of water daily      T2DM PHARMD GOAL (pt-stated)   On track     Current Barriers:  Unable to independently afford treatment regimen Suboptimal therapeutic regimen for T2DM  Pharmacist Clinical Goal(s):  patient will verbalize ability to afford treatment  regimen adhere to plan to optimize therapeutic regimen for T2DM as evidenced by report of adherence to recommended medication management changes through collaboration with PharmD and provider.   Interventions: 1:1 collaboration with Janora Norlander, DO regarding development and update of comprehensive plan of care as evidenced by provider attestation and co-signature Inter-disciplinary care team collaboration (see longitudinal plan of care) Comprehensive medication review performed; medication list updated in electronic medical record  Diabetes: Goal on Track (progressing): YES. Controlled-A1C 6.5%; current treatment: GLIPIZIDE '10MG'$  W/ BREAKFAST, ONGLYZA '5MG'$ ;  PATIENT STATES COST OF [name] IS >$150/MONTH (ENDS UP IN COVERAGE GAP) [name] REPLACED WITH ONGLYZA '5MG'$  DAILY SAMPLES PATIENT TOLERATING WELL APPLICATION SENT TO AZ&ME PATIENT ASSISTANCE PROGRAM FOR FREE ONGLYZA ESCRIBED TO [place] FOR PATIENT ASSISTANCE FIRST PATIENT SHIPMENT ARRIVED TO PCP OFFICE TODAY WILL RECHK A1C TO SEE PROGRESS AT NEXT VISIT IN MAY 2023 PLAN TO D/C GLIPIZIDE GIVEN AGE AND RISK OF HYPOGLYCEMIA GFR 51 Current glucose readings: fasting glucose: N/A, post prandial glucose: N/A Denies hypoglycemic/hyperglycemic symptoms Current exercise: N/A; PATIENT STILL ACTIVE/DRIVES Recommended CONTINUE ONGLYZA, POTENTIALLY D/C GLIPIZIDE DUE TO HYPOGYLCEMIA RISK DISCUSSED CHOLESTEROL AND LIPID PANEL CONTINUE ATORVASTATIN '20MG'$  DAILY FOR NOW GOAL<70,CONTINUE TO ADDRESS Lipid Panel     Component Value Date/Time   CHOL 159 05/14/2021 1045   TRIG 131 05/14/2021 1045   HDL 46 05/14/2021 1045   CHOLHDL 3.5 05/14/2021 1045   [place] 90 05/14/2021 1045   LABVLDL 23 05/14/2021 1045    Patient Goals/Self-Care Activities patient will:  - take medications as prescribed as evidenced by patient report and record review check glucose DAILY (FASTING), document, and provide at future appointments        Depression  Screen    08/06/2022    1:18 PM 07/13/2022    2:43 PM 07/01/2022    9:37 AM 02/27/2022    9:39 AM 10/27/2021    9:45 AM 09/25/2021   10:35 AM 08/05/2021  11:25 AM  PHQ 2/9 Scores  PHQ - 2 Score 0 0 0 0 0 0 0  PHQ- 9 Score    0       Fall Risk    08/06/2022    1:17 PM 07/01/2022    9:37 AM 07/01/2022    9:36 AM 05/07/2022   10:30 AM 02/27/2022   10:06 AM  [place] in the past year? 0  1 1 0  Number falls in past yr: 0 0  0   Injury with Fall? 0 0  1   Risk for fall due to : No Fall Risks History of fall(s)  History of fall(s)   Follow up Falls prevention discussed Education provided  Education provided     FALL RISK PREVENTION PERTAINING TO THE HOME:  Any stairs in or around the home? No  If so, are there any without handrails? No  Home free of loose throw rugs in walkways, pet beds, electrical cords, etc? Yes  Adequate lighting in your home to reduce risk of falls? Yes   ASSISTIVE DEVICES UTILIZED TO PREVENT FALLS:  Life alert? No  Use of a cane, walker or w/c? Yes  Grab bars in the bathroom? Yes  Shower chair or bench in shower? Yes  Elevated toilet seat or a handicapped toilet? Yes       03/22/2017    1:52 PM  MMSE - Mini Mental State Exam  Orientation to time 5  Orientation to Place 5  Registration 3  Attention/ Calculation 5  Recall 3  Language- name 2 objects 2  Language- repeat 1  Language- follow 3 step command 3  Language- read & follow direction 1  Write a sentence 1  Copy design 1  Total score 30        08/06/2022    1:20 PM 08/05/2021   11:36 AM 08/02/2020   11:04 AM 07/26/2019    8:49 AM  6CIT Screen  What Year? 0 points 0 points 0 points 0 points  What month? 0 points 0 points 0 points 0 points  What time? 0 points 0 points 0 points 0 points  Count back from 20 0 points 0 points 0 points 0 points  Months in reverse 0 points 0 points 0 points 0 points  Repeat phrase 0 points 2 points 2 points 4 points  Total Score 0 points 2 points 2 points 4  points    Immunizations Immunization History  Administered Date(s) Administered   Covid-19, Mrna,Vaccine(Spikevax)56yr and older 07/06/2022   Fluad Quad(high Dose 65+) 04/01/2020   Influenza, High Dose Seasonal PF 04/04/2018   Influenza,inj,Quad PF,6+ Mos 03/31/2017   Influenza-Unspecified 04/07/2016, 04/04/2018, 02/28/2019, 03/27/2021, 04/13/2022   Moderna Sars-Covid-2 Vaccination 07/11/2019, 08/11/2019, 04/24/2020, 03/27/2021   Pneumococcal Conjugate-13 08/22/2014   Pneumococcal Polysaccharide-23 04/11/1997   Td 07/01/2022   Td (Adult),5 Lf Tetanus Toxid, Preservative Free 11/09/2007   Zoster Recombinat (Shingrix) 03/22/2017, 06/02/2017    TDAP status: Up to date  Flu Vaccine status: Up to date  Pneumococcal vaccine status: Up to date  Covid-19 vaccine status: Completed vaccines  Qualifies for Shingles Vaccine? Yes   Zostavax completed Yes   Shingrix Completed?: Yes  Screening Tests Health Maintenance  Topic Date Due   COVID-19 Vaccine (6 - 2023-24 season) 08/31/2022   FOOT EXAM  10/28/2022   HEMOGLOBIN A1C  12/30/2022   OPHTHALMOLOGY EXAM  06/02/2023   Medicare Annual Wellness (AWV)  08/07/2023   DTaP/Tdap/Td (2 - Tdap) 07/01/2032  Pneumonia Vaccine 79+ Years old  Completed   INFLUENZA VACCINE  Completed   Zoster Vaccines- Shingrix  Completed   HPV VACCINES  Aged Out   DEXA SCAN  Discontinued    Health Maintenance  There are no preventive care reminders to display for this patient.   Colorectal cancer screening: No longer required.   Mammogram status: No longer required due to age.  Bone Density status: Ordered declined . Pt provided with contact info and advised to call to schedule appt.  Lung Cancer Screening: (Low Dose CT Chest recommended if Age 64-80 years, 30 pack-year currently smoking OR have quit w/in 15years.) does not qualify.   Lung Cancer Screening Referral: n/a  Additional Screening:  Hepatitis C Screening: does not qualify;  Vision  Screening: Recommended annual ophthalmology exams for early detection of glaucoma and other disorders of the eye. Is the patient up to date with their annual eye exam?  Yes  Who is the provider or what is the name of the office in which the patient attends annual eye exams? Dr.groat  If pt is not established with a provider, would they like to be referred to a provider to establish care? No .   Dental Screening: Recommended annual dental exams for proper oral hygiene  Community Resource Referral / Chronic Care Management: CRR required this visit?  No   CCM required this visit?  No      Plan:     I have personally reviewed and noted the following in the patient's chart:   Medical and social history Use of alcohol, tobacco or illicit drugs  Current medications and supplements including opioid prescriptions. Patient is not currently taking opioid prescriptions. Functional ability and status Nutritional status Physical activity Advanced directives List of other physicians Hospitalizations, surgeries, and ER visits in previous 12 months Vitals Screenings to include cognitive, depression, and falls Referrals and appointments  In addition, I have reviewed and discussed with patient certain preventive protocols, quality metrics, and best practice recommendations. A written personalized care plan for preventive services as well as general preventive health recommendations were provided to patient.     Daphane Shepherd, LPN   01/04/9380   Nurse Notes: Declined DEXA

## 2022-08-06 NOTE — Patient Instructions (Signed)
Maria Alexander , Thank you for taking time to come for your Medicare Wellness Visit. I appreciate your ongoing commitment to your health goals. Please review the following plan we discussed and let me know if I can assist you in the future.   These are the goals we discussed:  Goals       CCM (DIABETES) EXPECTED OUTCOME:  MONITOR, SELF-MANAGE AND REDUCE SYMPTOMS OF DIABETES      Current Barriers:  Knowledge Deficits related to Diabetes management Chronic Disease Management support and education needs related to Diabetes, diet Patient reports she had not been following a special diet but is now "trying to do better and not eat so many sweets"  Patient reports she loves sweets and this has been an issue for her. Patient reports she does not monitor CBG and has not been instructed to.  Planned Interventions: Provided education to patient about basic DM disease process; Reviewed medications with patient and discussed importance of medication adherence;        Reviewed prescribed diet with patient carbohydrate modified; Counseled on importance of regular laboratory monitoring as prescribed;        Discussed plans with patient for ongoing care management follow up and provided patient with direct contact information for care management team;      Provided patient with written educational materials related to hypo and hyperglycemia and importance of correct treatment;       Screening for signs and symptoms of depression related to chronic disease state;        Assessed social determinant of health barriers;        Reviewed importance of limiting/ avoiding concentrated sweets  Symptom Management: Take medications as prescribed   Attend all scheduled provider appointments Call pharmacy for medication refills 3-7 days in advance of running out of medications Attend church or other social activities Perform all self care activities independently  check feet daily for cuts, sores or redness trim  toenails straight across drink 6 to 8 glasses of water each day fill half of plate with vegetables limit fast food meals to no more than 1 per week manage portion size prepare main meal at home 3 to 5 days each week read food labels for fat, fiber, carbohydrates and portion size Look over education sent via my chart- hypoglycemia Please avoid concentrated sweets (cakes, cookies, pies etc)  Follow Up Plan: Telephone follow up appointment with care management team member scheduled for:  08/25/22 at 3 pm        CCM (HYPERTENSION) EXPECTED OUTCOME: MONITOR, SELF-MANAGE AND REDUCE SYMPTOMS OF HYPERTENSION      Current Barriers:  Knowledge Deficits related to Hypertension management Chronic Disease Management support and education needs related to Hypertension, diet Patient reports she lives alone, has daughter nearby that assists as needed.  Patient reports she does have blood pressure cuff but does not monitor blood pressure at home. Patient reports she was exercising at [facility] until her hospitalization 04/19/22 for UTI.  Patient states she plans to hopefully start back doing some type of exercise. Patient reports she is trying to drink mostly water now, drinks one diet drink daily With medication review, pt states she is not taking metoprolol and does not have on hand  Planned Interventions: Evaluation of current treatment plan related to hypertension self management and patient's adherence to plan as established by provider;   Reviewed prescribed diet low sodium Reviewed medications with patient and discussed importance of compliance;  Counseled on the importance of exercise  goals with target of 150 minutes per week Discussed plans with patient for ongoing care management follow up and provided patient with direct contact information for care management team; Advised patient, providing education and rationale, to monitor blood pressure daily and record, calling PCP for findings outside  established parameters;  Advised patient to discuss any issues with blood pressure, medications with provider; Provided education on prescribed diet low sodium;  Discussed complications of poorly controlled blood pressure such as heart disease, stroke, circulatory complications, vision complications, kidney impairment, sexual dysfunction;  Screening for signs and symptoms of depression related to chronic disease state;  Assessed social determinant of health barriers;  Reviewed importance of drinking adequate fluids, preferably water Reviewed signs/ symptoms UTI and correlation of elevated CBG with infection In basket message sent to primary care provider inquiring as to whether pt is supposed to be taking metoprolol  Symptom Management: Take medications as prescribed   Attend all scheduled provider appointments Call pharmacy for medication refills 3-7 days in advance of running out of medications Attend church or other social activities Perform all self care activities independently  check blood pressure weekly choose a place to take my blood pressure (home, clinic or office, retail store) write blood pressure results in a log or diary keep a blood pressure log take blood pressure log to all doctor appointments keep all doctor appointments take medications for blood pressure exactly as prescribed report new symptoms to your doctor eat more whole grains, fruits and vegetables, lean meats and healthy fats Look over education sent via my chart- low sodium diet Continue drinking adequate fluids, preferably water Please let someone know if you feel like you have any symptoms of urinary tract infection (burning, frequency, pain) Message was sent to your doctor asking if you are supposed to be taking metoprolol- per our conversation today you report you are not taking  Follow Up Plan: Telephone follow up appointment with care management team member scheduled for:  08/25/22 at 3 pm         Chronic Disease Management Needs      Current Barriers:  Chronic Disease Management support, education, and care coordination needs related to HTN, DM, hypothyroidism, hyperlipidemia, anxiety  Clinical Goal(s) related to HTN, DM, hypothyroidism, hyperlipidemia, anxiety:  Over the next 90 days, patient will:  Work with the care management team to address educational, disease management, and care coordination needs  Call provider office for new or worsened signs and symptoms  Call care management team with questions or concerns Verbalize basic understanding of patient centered plan of care established today  Interventions related to HTN, DM, hypothyroidism, hyperlipidemia, anxiety:  Chart reviewed Evaluation of current treatment plans and patient's adherence to plan as established by provider Patient denies any needs related to her chronic medical conditions at this time Provided with RN CCM contact info and encouraged to reach out as needed RN will follow-up with patient by telephone again over the next 45 days  Patient Self Care Activities related to HTN, DM, hypothyroidism, hyperlipidemia, anxiety:  Patient is unable to independently self-manage chronic health conditions  Please see past updates related to this goal by clicking on the "Past Updates" button in the selected goal        DIET - INCREASE WATER INTAKE      Try to drink 6-8 glasses of water daily      Eat more fruits and vegetables      Increase fruits and vegetable servings to at least 1 serving of  each per day.      Exercise 150 min/wk Moderate Activity      Need for increased activity level in patient with diabetes, hypertension, and hyperlipidemia.   Current Barriers:  Knowledge Deficits related to how to safely increase activity level  Nurse Case Manager Clinical Goal(s):  Over the next 30 days, patient will slowly increase activity level as tolerated with a goal of 150 min of moderate activity a week Over the  next 90 days, patient will work with Consulting civil engineer to develop an exercise plan  Interventions:  Provided education to patient re: increasing physical activity level Discussed plans with patient for ongoing care management follow up and provided patient with direct contact information for care management team RN will mail printed resources for review  Patient Self Care Activities:  Performs ADL's independently Performs IADL's independently  Initial goal documentation       Patient Stated      08/02/2020 AWV Goal: Fall Prevention  Over the next year, patient will decrease their risk for falls by: Using assistive devices, such as a cane or walker, as needed Identifying fall risks within their home and correcting them by: Removing throw rugs Adding handrails to stairs or ramps Removing clutter and keeping a clear pathway throughout the home Increasing light, especially at night Adding shower handles/bars Raising toilet seat Identifying potential personal risk factors for falls: Medication side effects Incontinence/urgency Vestibular dysfunction Hearing loss Musculoskeletal disorders Neurological disorders Orthostatic hypotension        T2DM PHARMD GOAL (pt-stated)      Current Barriers:  Unable to independently afford treatment regimen Suboptimal therapeutic regimen for T2DM  Pharmacist Clinical Goal(s):  patient will verbalize ability to afford treatment regimen adhere to plan to optimize therapeutic regimen for T2DM as evidenced by report of adherence to recommended medication management changes through collaboration with PharmD and provider.   Interventions: 1:1 collaboration with Janora Norlander, DO regarding development and update of comprehensive plan of care as evidenced by provider attestation and co-signature Inter-disciplinary care team collaboration (see longitudinal plan of care) Comprehensive medication review performed; medication list updated in  electronic medical record  Diabetes: Goal on Track (progressing): YES. Controlled-A1C 6.5%; current treatment: GLIPIZIDE '10MG'$  W/ BREAKFAST, ONGLYZA '5MG'$ ;  PATIENT STATES COST OF [name] IS >$150/MONTH (ENDS UP IN COVERAGE GAP) [name] REPLACED WITH ONGLYZA '5MG'$  DAILY SAMPLES PATIENT TOLERATING WELL APPLICATION SENT TO [facility] PATIENT ASSISTANCE PROGRAM FOR FREE ONGLYZA ESCRIBED TO [facility] FOR PATIENT ASSISTANCE FIRST PATIENT SHIPMENT ARRIVED TO PCP OFFICE TODAY WILL RECHK A1C TO SEE PROGRESS AT NEXT VISIT IN MAY 2023 PLAN TO D/C GLIPIZIDE GIVEN AGE AND RISK OF HYPOGLYCEMIA GFR 51 Current glucose readings: fasting glucose: N/A, post prandial glucose: N/A Denies hypoglycemic/hyperglycemic symptoms Current exercise: N/A; PATIENT STILL ACTIVE/DRIVES Recommended CONTINUE ONGLYZA, POTENTIALLY D/C GLIPIZIDE DUE TO HYPOGYLCEMIA RISK DISCUSSED CHOLESTEROL AND LIPID PANEL CONTINUE ATORVASTATIN '20MG'$  DAILY FOR NOW GOAL<70,CONTINUE TO ADDRESS Lipid Panel     Component Value Date/Time   CHOL 159 05/14/2021 1045   TRIG 131 05/14/2021 1045   HDL 46 05/14/2021 1045   CHOLHDL 3.5 05/14/2021 1045   [place] 90 05/14/2021 1045   LABVLDL 23 05/14/2021 1045    Patient Goals/Self-Care Activities patient will:  - take medications as prescribed as evidenced by patient report and record review check glucose DAILY (FASTING), document, and provide at future appointments         This is a list of the screening recommended for you and due dates:  Health  Maintenance  Topic Date Due   COVID-19 Vaccine (6 - 2023-24 season) 08/31/2022   Complete foot exam   10/28/2022   Hemoglobin A1C  12/30/2022   Eye exam for diabetics  06/02/2023   Medicare Annual Wellness Visit  08/07/2023   DTaP/Tdap/Td vaccine (2 - Tdap) 07/01/2032   Pneumonia Vaccine  Completed   Flu Shot  Completed   Zoster (Shingles) Vaccine  Completed   HPV Vaccine  Aged Out   DEXA scan (bone density measurement)  Discontinued    Advanced  directives: Please bring a copy of your health care power of attorney and living will to the office to be added to your chart at your convenience.   Conditions/risks identified: Aim for 30 minutes of exercise or brisk walking, 6-8 glasses of water, and 5 servings of fruits and vegetables each day.   Next appointment: Follow up in one year for your annual wellness visit    Preventive Care 65 Years and Older, Female Preventive care refers to lifestyle choices and visits with your health care provider that can promote health and wellness. What does preventive care include? A yearly physical exam. This is also called an annual well check. Dental exams once or twice a year. Routine eye exams. Ask your health care provider how often you should have your eyes checked. Personal lifestyle choices, including: Daily care of your teeth and gums. Regular physical activity. Eating a healthy diet. Avoiding tobacco and drug use. Limiting alcohol use. Practicing safe sex. Taking low-dose aspirin every day. Taking vitamin and mineral supplements as recommended by your health care provider. What happens during an annual well check? The services and screenings done by your health care provider during your annual well check will depend on your age, overall health, lifestyle risk factors, and family history of disease. Counseling  Your health care provider may ask you questions about your: Alcohol use. Tobacco use. Drug use. Emotional well-being. Home and relationship well-being. Sexual activity. Eating habits. History of falls. Memory and ability to understand (cognition). Work and work Statistician. Reproductive health. Screening  You may have the following tests or measurements: Height, weight, and BMI. Blood pressure. Lipid and cholesterol levels. These may be checked every 5 years, or more frequently if you are over 42 years old. Skin check. Lung cancer screening. You may have this screening  every year starting at age 48 if you have a 30-pack-year history of smoking and currently smoke or have quit within the past 15 years. Fecal occult blood test (FOBT) of the stool. You may have this test every year starting at age 55. Flexible sigmoidoscopy or colonoscopy. You may have a sigmoidoscopy every 5 years or a colonoscopy every 10 years starting at age 46. Hepatitis C blood test. Hepatitis B blood test. Sexually transmitted disease (STD) testing. Diabetes screening. This is done by checking your blood sugar (glucose) after you have not eaten for a while (fasting). You may have this done every 1-3 years. Bone density scan. This is done to screen for osteoporosis. You may have this done starting at age 67. Mammogram. This may be done every 1-2 years. Talk to your health care provider about how often you should have regular mammograms. Talk with your health care provider about your test results, treatment options, and if necessary, the need for more tests. Vaccines  Your health care provider may recommend certain vaccines, such as: Influenza vaccine. This is recommended every year. Tetanus, diphtheria, and acellular pertussis (Tdap, Td) vaccine. You may need  a Td booster every 10 years. Zoster vaccine. You may need this after age 24. Pneumococcal 13-valent conjugate (PCV13) vaccine. One dose is recommended after age 10. Pneumococcal polysaccharide (PPSV23) vaccine. One dose is recommended after age 68. Talk to your health care provider about which screenings and vaccines you need and how often you need them. This information is not intended to replace advice given to you by your health care provider. Make sure you discuss any questions you have with your health care provider. Document Released: 07/12/2015 Document Revised: 03/04/2016 Document Reviewed: 04/16/2015 Elsevier Interactive Patient Education  2017 [place] Prevention in the Home Falls can cause injuries. They can  happen to people of all ages. There are many things you can do to make your home safe and to help prevent falls. What can I do on the outside of my home? Regularly fix the edges of walkways and driveways and fix any cracks. Remove anything that might make you trip as you walk through a door, such as a raised step or threshold. Trim any bushes or trees on the path to your home. Use bright outdoor lighting. Clear any walking paths of anything that might make someone trip, such as rocks or tools. Regularly check to see if handrails are loose or broken. Make sure that both sides of any steps have handrails. Any raised decks and porches should have guardrails on the edges. Have any leaves, snow, or ice cleared regularly. Use sand or salt on walking paths during winter. Clean up any spills in your garage right away. This includes oil or grease spills. What can I do in the bathroom? Use night lights. Install grab bars by the toilet and in the tub and shower. Do not use towel bars as grab bars. Use non-skid mats or decals in the tub or shower. If you need to sit down in the shower, use a plastic, non-slip stool. Keep the floor dry. Clean up any water that spills on the floor as soon as it happens. Remove soap buildup in the tub or shower regularly. Attach bath mats securely with double-sided non-slip rug tape. Do not have throw rugs and other things on the floor that can make you trip. What can I do in the bedroom? Use night lights. Make sure that you have a light by your bed that is easy to reach. Do not use any sheets or blankets that are too big for your bed. They should not hang down onto the floor. Have a firm chair that has side arms. You can use this for support while you get dressed. Do not have throw rugs and other things on the floor that can make you trip. What can I do in the kitchen? Clean up any spills right away. Avoid walking on wet floors. Keep items that you use a lot in  easy-to-reach places. If you need to reach something above you, use a strong step stool that has a grab bar. Keep electrical cords out of the way. Do not use floor polish or wax that makes floors slippery. If you must use wax, use non-skid floor wax. Do not have throw rugs and other things on the floor that can make you trip. What can I do with my stairs? Do not leave any items on the stairs. Make sure that there are handrails on both sides of the stairs and use them. Fix handrails that are broken or loose. Make sure that handrails are as long as the stairways. Check  any carpeting to make sure that it is firmly attached to the stairs. Fix any carpet that is loose or worn. Avoid having throw rugs at the top or bottom of the stairs. If you do have throw rugs, attach them to the floor with carpet tape. Make sure that you have a light switch at the top of the stairs and the bottom of the stairs. If you do not have them, ask someone to add them for you. What else can I do to help prevent falls? Wear shoes that: Do not have high heels. Have rubber bottoms. Are comfortable and fit you well. Are closed at the toe. Do not wear sandals. If you use a stepladder: Make sure that it is fully opened. Do not climb a closed stepladder. Make sure that both sides of the stepladder are locked into place. Ask someone to hold it for you, if possible. Clearly mark and make sure that you can see: Any grab bars or handrails. First and last steps. Where the edge of each step is. Use tools that help you move around (mobility aids) if they are needed. These include: Canes. Walkers. Scooters. Crutches. Turn on the lights when you go into a dark area. Replace any light bulbs as soon as they burn out. Set up your furniture so you have a clear path. Avoid moving your furniture around. If any of your floors are uneven, fix them. If there are any pets around you, be aware of where they are. Review your medicines  with your doctor. Some medicines can make you feel dizzy. This can increase your chance of falling. Ask your doctor what other things that you can do to help prevent falls. This information is not intended to replace advice given to you by your health care provider. Make sure you discuss any questions you have with your health care provider. Document Released: 04/11/2009 Document Revised: 11/21/2015 Document Reviewed: 07/20/2014 Elsevier Interactive Patient Education  2017 [employer].

## 2022-08-20 ENCOUNTER — Other Ambulatory Visit: Payer: Self-pay | Admitting: Family Medicine

## 2022-08-20 DIAGNOSIS — F419 Anxiety disorder, unspecified: Secondary | ICD-10-CM

## 2022-08-21 ENCOUNTER — Other Ambulatory Visit: Payer: Self-pay | Admitting: Family Medicine

## 2022-08-21 DIAGNOSIS — F419 Anxiety disorder, unspecified: Secondary | ICD-10-CM

## 2022-08-21 MED ORDER — ALPRAZOLAM 1 MG PO TABS: 0.5000 mg | ORAL_TABLET | Freq: Every evening | ORAL | 3 refills | Status: DC | PRN

## 2022-08-21 NOTE — Telephone Encounter (Signed)
Pt called stating that she got the message about her Xanax Rx being denied because she needs an appt but says she is not due to see Dr Lajuana Ripple again until April and that is what Dr Lajuana Ripple said to her when she saw her in January at her last check up visit.   Please advise.

## 2022-08-21 NOTE — Telephone Encounter (Signed)
Looks like she didn't ask for refill at that visit, so it was not addressed.  The Narcotic Database has been reviewed.  There were no red flags.  Rx sent until follow up.

## 2022-08-21 NOTE — Progress Notes (Signed)
The Narcotic Database has been reviewed.  There were no red flags.    

## 2022-08-24 ENCOUNTER — Telehealth: Payer: Self-pay | Admitting: Family Medicine

## 2022-08-24 DIAGNOSIS — E7800 Pure hypercholesterolemia, unspecified: Secondary | ICD-10-CM

## 2022-08-24 MED ORDER — ATORVASTATIN CALCIUM 80 MG PO TABS: ORAL_TABLET | ORAL | 0 refills | Status: DC

## 2022-08-24 NOTE — Telephone Encounter (Signed)
Refill sent to pharmacy.   

## 2022-08-24 NOTE — Telephone Encounter (Signed)
  Prescription Request  08/24/2022  Is this a "Controlled Substance" medicine? NO  Have you seen your PCP in the last 2 weeks? No  If YES, route message to pool  -  If NO, patient needs to be scheduled for appointment.  What is the name of the medication or equipment? Atorvastatin 80 mg  Have you contacted your pharmacy to request a refill? yes   Which pharmacy would you like this sent to? [facility]   Patient notified that their request is being sent to the clinical staff for review and that they should receive a response within 2 business days.

## 2022-08-25 ENCOUNTER — Ambulatory Visit (INDEPENDENT_AMBULATORY_CARE_PROVIDER_SITE_OTHER): Payer: Medicare Other | Admitting: *Deleted

## 2022-08-25 DIAGNOSIS — E1165 Type 2 diabetes mellitus with hyperglycemia: Secondary | ICD-10-CM

## 2022-08-25 DIAGNOSIS — E1159 Type 2 diabetes mellitus with other circulatory complications: Secondary | ICD-10-CM

## 2022-08-25 NOTE — Chronic Care Management (AMB) (Signed)
Chronic Care Management   CCM RN Visit Note  08/25/2022 Name: Maria Alexander MRN: [id number] DOB: 1924/06/24  Subjective: OTHA CARLYLE is a 89 y.o. year old female who is a primary care patient of Janora Norlander, DO. The patient was referred to the Chronic Care Management team for assistance with care management needs subsequent to provider initiation of CCM services and plan of care.    Today's Visit:  Engaged with patient by telephone for follow up visit.        Goals Addressed             This Visit's Progress    CCM (DIABETES) EXPECTED OUTCOME:  MONITOR, SELF-MANAGE AND REDUCE SYMPTOMS OF DIABETES       Current Barriers:  Knowledge Deficits related to Diabetes management Chronic Disease Management support and education needs related to Diabetes, diet Patient reports she had not been following a special diet but is now "trying to do better and not eat so many sweets"  Patient reports she loves sweets and this has been an issue for her. Patient reports she does not monitor CBG and has not been instructed to. Patient reports she can not walk long distances, has cane or walker and uses if needed  Planned Interventions: Provided education to patient about basic DM disease process; Reviewed medications with patient and discussed importance of medication adherence;        Reviewed prescribed diet with patient carbohydrate modified; Counseled on importance of regular laboratory monitoring as prescribed;        Review of patient status, including review of consultants reports, relevant laboratory and other test results, and medications completed;       Reinforced importance of limiting/ avoiding concentrated sweets  Symptom Management: Take medications as prescribed   Attend all scheduled provider appointments Call pharmacy for medication refills 3-7 days in advance of running out of medications Attend church or other social activities Perform all self care activities  independently  check feet daily for cuts, sores or redness trim toenails straight across drink 6 to 8 glasses of water each day fill half of plate with vegetables limit fast food meals to no more than 1 per week read food labels for fat, fiber, carbohydrates and portion size set a realistic goal Follow a carbohydrate modified diet- too much bread, pasta, potatoes at one meal can elevate your blood sugar Please avoid concentrated sweets (cakes, cookies, pies etc)  Follow Up Plan: Telephone follow up appointment with care management team member scheduled for:  11/11/22 at 215 pm       CCM (HYPERTENSION) EXPECTED OUTCOME: MONITOR, SELF-MANAGE AND REDUCE SYMPTOMS OF HYPERTENSION       Current Barriers:  Knowledge Deficits related to Hypertension management Chronic Disease Management support and education needs related to Hypertension, diet Patient reports she lives alone, has daughter nearby that assists as needed.  Patient reports she does have blood pressure cuff but does not monitor blood pressure at home. Patient reports she was exercising at [facility] until her hospitalization 04/19/22 for UTI.  Patient states she plans to hopefully start back doing some type of exercise but is having difficulty walking long distances, she is active around her house Patient reports she is trying to drink mostly water now, drinks one diet drink daily With medication review, pt is not taking metoprolol  Planned Interventions: Evaluation of current treatment plan related to hypertension self management and patient's adherence to plan as established by provider;   Reviewed medications with  patient and discussed importance of compliance;  Counseled on the importance of exercise goals with target of 150 minutes per week Advised patient, providing education and rationale, to monitor blood pressure daily and record, calling PCP for findings outside established parameters;  Advised patient to discuss any issues with  blood pressure, medications with provider; Discussed complications of poorly controlled blood pressure such as heart disease, stroke, circulatory complications, vision complications, kidney impairment, sexual dysfunction;  Reviewed low sodium diet Reinforced importance of drinking adequate fluids, preferably water Reinforced signs/ symptoms UTI and correlation of elevated CBG with infection Telephone call to [facility], spoke with pharmacist who verified metoprolol has not been filled since October 2023 and has no refills  Symptom Management: Take medications as prescribed   Attend all scheduled provider appointments Call pharmacy for medication refills 3-7 days in advance of running out of medications Attend church or other social activities Perform all self care activities independently  check blood pressure weekly choose a place to take my blood pressure (home, clinic or office, retail store) write blood pressure results in a log or diary keep a blood pressure log take blood pressure log to all doctor appointments call doctor for signs and symptoms of high blood pressure keep all doctor appointments take medications for blood pressure exactly as prescribed report new symptoms to your doctor eat more whole grains, fruits and vegetables, lean meats and healthy fats Follow low sodium diet- limit fast food Read food labels for sodium content Continue drinking adequate fluids, preferably water Please let someone know if you feel like you have any symptoms of urinary tract infection (burning, frequency, pain)  Follow Up Plan: Telephone follow up appointment with care management team member scheduled for:  11/11/22 at 215 pm          Plan:Telephone follow up appointment with care management team member scheduled for:  11/11/22 at 215 pm  Jacqlyn Larsen [facility], BSN RN Case Manager [place] [phone]

## 2022-08-25 NOTE — Patient Instructions (Signed)
Please call the care guide team at [phone] if you need to cancel or reschedule your appointment.   If you are experiencing a Mental Health or [place] or need someone to talk to, please call the Suicide and Crisis Lifeline: 988 call the [place] National Suicide Prevention Lifeline: 407-413-8693 or TTY: 757 218 8701 TTY 3090385197) to talk to a trained counselor call 1-800-273-TALK (toll free, 24 hour hotline) go to [facility] [address] [place] [phone]) call the [facility]: [phone] call 911   Following is a copy of the CCM Program Consent:  CCM service includes personalized support from designated clinical staff supervised by the physician, including individualized plan of care and coordination with other care providers 24/7 contact phone numbers for assistance for urgent and routine care needs. Service will only be billed when office clinical staff spend 20 minutes or more in a month to coordinate care. Only one practitioner may furnish and bill the service in a calendar month. The patient may stop CCM services at amy time (effective at the end of the month) by phone call to the office staff. The patient will be responsible for cost sharing (co-pay) or up to 20% of the service fee (after annual deductible is met)  Following is a copy of your full provider care plan:   Goals Addressed             This Visit's Progress    CCM (DIABETES) EXPECTED OUTCOME:  MONITOR, SELF-MANAGE AND REDUCE SYMPTOMS OF DIABETES       Current Barriers:  Knowledge Deficits related to Diabetes management Chronic Disease Management support and education needs related to Diabetes, diet Patient reports she had not been following a special diet but is now "trying to do better and not eat so many sweets"  Patient reports she loves sweets and this has been an issue for her. Patient reports she does not monitor CBG and  has not been instructed to. Patient reports she can not walk long distances, has cane or walker and uses if needed  Planned Interventions: Provided education to patient about basic DM disease process; Reviewed medications with patient and discussed importance of medication adherence;        Reviewed prescribed diet with patient carbohydrate modified; Counseled on importance of regular laboratory monitoring as prescribed;        Review of patient status, including review of consultants reports, relevant laboratory and other test results, and medications completed;       Reinforced importance of limiting/ avoiding concentrated sweets  Symptom Management: Take medications as prescribed   Attend all scheduled provider appointments Call pharmacy for medication refills 3-7 days in advance of running out of medications Attend church or other social activities Perform all self care activities independently  check feet daily for cuts, sores or redness trim toenails straight across drink 6 to 8 glasses of water each day fill half of plate with vegetables limit fast food meals to no more than 1 per week read food labels for fat, fiber, carbohydrates and portion size set a realistic goal Follow a carbohydrate modified diet- too much bread, pasta, potatoes at one meal can elevate your blood sugar Please avoid concentrated sweets (cakes, cookies, pies etc)  Follow Up Plan: Telephone follow up appointment with care management team member scheduled for:  11/11/22 at 215 pm       CCM (HYPERTENSION) EXPECTED OUTCOME: MONITOR, SELF-MANAGE AND REDUCE SYMPTOMS OF HYPERTENSION  Current Barriers:  Knowledge Deficits related to Hypertension management Chronic Disease Management support and education needs related to Hypertension, diet Patient reports she lives alone, has daughter nearby that assists as needed.  Patient reports she does have blood pressure cuff but does not monitor blood pressure at  home. Patient reports she was exercising at [facility] until her hospitalization 04/19/22 for UTI.  Patient states she plans to hopefully start back doing some type of exercise but is having difficulty walking long distances, she is active around her house Patient reports she is trying to drink mostly water now, drinks one diet drink daily With medication review, pt is not taking metoprolol  Planned Interventions: Evaluation of current treatment plan related to hypertension self management and patient's adherence to plan as established by provider;   Reviewed medications with patient and discussed importance of compliance;  Counseled on the importance of exercise goals with target of 150 minutes per week Advised patient, providing education and rationale, to monitor blood pressure daily and record, calling PCP for findings outside established parameters;  Advised patient to discuss any issues with blood pressure, medications with provider; Discussed complications of poorly controlled blood pressure such as heart disease, stroke, circulatory complications, vision complications, kidney impairment, sexual dysfunction;  Reviewed low sodium diet Reinforced importance of drinking adequate fluids, preferably water Reinforced signs/ symptoms UTI and correlation of elevated CBG with infection Telephone call to [facility], spoke with pharmacist who verified metoprolol has not been filled since October 2023 and has no refills  Symptom Management: Take medications as prescribed   Attend all scheduled provider appointments Call pharmacy for medication refills 3-7 days in advance of running out of medications Attend church or other social activities Perform all self care activities independently  check blood pressure weekly choose a place to take my blood pressure (home, clinic or office, retail store) write blood pressure results in a log or diary keep a blood pressure log take blood pressure log to  all doctor appointments call doctor for signs and symptoms of high blood pressure keep all doctor appointments take medications for blood pressure exactly as prescribed report new symptoms to your doctor eat more whole grains, fruits and vegetables, lean meats and healthy fats Follow low sodium diet- limit fast food Read food labels for sodium content Continue drinking adequate fluids, preferably water Please let someone know if you feel like you have any symptoms of urinary tract infection (burning, frequency, pain)  Follow Up Plan: Telephone follow up appointment with care management team member scheduled for:  11/11/22 at 215 pm          Patient verbalizes understanding of instructions and care plan provided today and agrees to view in [place]. Active MyChart status and patient understanding of how to access instructions and care plan via MyChart confirmed with patient.     Telephone follow up appointment with care management team member scheduled for:  11/11/22 at 215 pm  Diabetes Mellitus and Nutrition, Adult When you have diabetes, or diabetes mellitus, it is very important to have healthy eating habits because your blood sugar (glucose) levels are greatly affected by what you eat and drink. Eating healthy foods in the right amounts, at about the same times every day, can help you: Manage your blood glucose. Lower your risk of heart disease. Improve your blood pressure. Reach or maintain a healthy weight. What can affect my meal plan? Every person with diabetes is different, and each person has different needs for a meal plan.  Your health care provider may recommend that you work with a dietitian to make a meal plan that is best for you. Your meal plan may vary depending on factors such as: The calories you need. The medicines you take. Your weight. Your blood glucose, blood pressure, and cholesterol levels. Your activity level. Other health conditions you have, such as heart  or kidney disease. How do carbohydrates affect me? Carbohydrates, also called carbs, affect your blood glucose level more than any other type of food. Eating carbs raises the amount of glucose in your blood. It is important to know how many carbs you can safely have in each meal. This is different for every person. Your dietitian can help you calculate how many carbs you should have at each meal and for each snack. How does alcohol affect me? Alcohol can cause a decrease in blood glucose (hypoglycemia), especially if you use insulin or take certain diabetes medicines by mouth. Hypoglycemia can be a life-threatening condition. Symptoms of hypoglycemia, such as sleepiness, dizziness, and confusion, are similar to symptoms of having too much alcohol. Do not drink alcohol if: Your health care provider tells you not to drink. You are pregnant, may be pregnant, or are planning to become pregnant. If you drink alcohol: Limit how much you have to: 0-1 drink a day for women. 0-2 drinks a day for men. Know how much alcohol is in your drink. In the [place] one drink equals one 12 oz bottle of beer (355 mL), one 5 oz glass of wine (148 mL), or one 1 oz glass of hard liquor (44 mL). Keep yourself hydrated with water, diet soda, or unsweetened iced tea. Keep in mind that regular soda, juice, and other mixers may contain a lot of sugar and must be counted as carbs. What are tips for following this plan?  Reading food labels Start by checking the serving size on the Nutrition Facts label of packaged foods and drinks. The number of calories and the amount of carbs, fats, and other nutrients listed on the label are based on one serving of the item. Many items contain more than one serving per package. Check the total grams (g) of carbs in one serving. Check the number of grams of saturated fats and trans fats in one serving. Choose foods that have a low amount or none of these fats. Check the number of milligrams  (mg) of salt (sodium) in one serving. Most people should limit total sodium intake to less than 2,300 mg per day. Always check the nutrition information of foods labeled as "low-fat" or "nonfat." These foods may be higher in added sugar or refined carbs and should be avoided. Talk to your dietitian to identify your daily goals for nutrients listed on the label. Shopping Avoid buying canned, pre-made, or processed foods. These foods tend to be high in fat, sodium, and added sugar. Shop around the outside edge of the grocery store. This is where you will most often find fresh fruits and vegetables, bulk grains, fresh meats, and fresh dairy products. Cooking Use low-heat cooking methods, such as baking, instead of high-heat cooking methods, such as deep frying. Cook using healthy oils, such as olive, canola, or sunflower oil. Avoid cooking with butter, cream, or high-fat meats. Meal planning Eat meals and snacks regularly, preferably at the same times every day. Avoid going long periods of time without eating. Eat foods that are high in fiber, such as fresh fruits, vegetables, beans, and whole grains. Eat 4-6 oz (112-168  g) of lean protein each day, such as lean meat, chicken, fish, eggs, or tofu. One ounce (oz) (28 g) of lean protein is equal to: 1 oz (28 g) of meat, chicken, or fish. 1 egg.  cup (62 g) of tofu. Eat some foods each day that contain healthy fats, such as avocado, nuts, seeds, and fish. What foods should I eat? Fruits Berries. Apples. Oranges. Peaches. Apricots. Plums. Grapes. Mangoes. Papayas. Pomegranates. Kiwi. Cherries. Vegetables Leafy greens, including lettuce, spinach, kale, chard, collard greens, mustard greens, and cabbage. Beets. Cauliflower. Broccoli. Carrots. Green beans. Tomatoes. Peppers. Onions. Cucumbers. Brussels sprouts. Grains Whole grains, such as whole-wheat or whole-grain bread, crackers, tortillas, cereal, and pasta. Unsweetened oatmeal. Quinoa. Brown or  wild rice. Meats and other proteins Seafood. Poultry without skin. Lean cuts of poultry and beef. Tofu. Nuts. Seeds. Dairy Low-fat or fat-free dairy products such as milk, yogurt, and cheese. The items listed above may not be a complete list of foods and beverages you can eat and drink. Contact a dietitian for more information. What foods should I avoid? Fruits Fruits canned with syrup. Vegetables Canned vegetables. Frozen vegetables with butter or cream sauce. Grains Refined white flour and flour products such as bread, pasta, snack foods, and cereals. Avoid all processed foods. Meats and other proteins Fatty cuts of meat. Poultry with skin. Breaded or fried meats. Processed meat. Avoid saturated fats. Dairy Full-fat yogurt, cheese, or milk. Beverages Sweetened drinks, such as soda or iced tea. The items listed above may not be a complete list of foods and beverages you should avoid. Contact a dietitian for more information. Questions to ask a health care provider Do I need to meet with a certified diabetes care and education specialist? Do I need to meet with a dietitian? What number can I call if I have questions? When are the best times to check my blood glucose? Where to find more information: American Diabetes Association: diabetes.org Academy of Nutrition and Dietetics: eatright.[employer] of Diabetes and Digestive and Kidney Diseases: [employer] Association of Diabetes Care & Education Specialists: diabeteseducator.org Summary It is important to have healthy eating habits because your blood sugar (glucose) levels are greatly affected by what you eat and drink. It is important to use alcohol carefully. A healthy meal plan will help you manage your blood glucose and lower your risk of heart disease. Your health care provider may recommend that you work with a dietitian to make a meal plan that is best for you. This information is not intended to replace advice  given to you by your health care provider. Make sure you discuss any questions you have with your health care provider. Document Revised: 01/17/2020 Document Reviewed: 01/17/2020 Elsevier Patient Education  [place].

## 2022-08-27 DIAGNOSIS — I10 Essential (primary) hypertension: Secondary | ICD-10-CM

## 2022-08-27 DIAGNOSIS — E1159 Type 2 diabetes mellitus with other circulatory complications: Secondary | ICD-10-CM | POA: Diagnosis not present

## 2022-09-05 ENCOUNTER — Other Ambulatory Visit: Payer: Self-pay | Admitting: Family Medicine

## 2022-09-30 ENCOUNTER — Encounter: Payer: Self-pay | Admitting: Family Medicine

## 2022-09-30 ENCOUNTER — Ambulatory Visit (INDEPENDENT_AMBULATORY_CARE_PROVIDER_SITE_OTHER): Payer: Medicare Other | Admitting: Family Medicine

## 2022-09-30 VITALS — BP 86/48 | HR 72 | Temp 98.7°F | Ht 61.0 in | Wt 118.6 lb

## 2022-09-30 DIAGNOSIS — E039 Hypothyroidism, unspecified: Secondary | ICD-10-CM

## 2022-09-30 DIAGNOSIS — E1169 Type 2 diabetes mellitus with other specified complication: Secondary | ICD-10-CM | POA: Diagnosis not present

## 2022-09-30 DIAGNOSIS — J3489 Other specified disorders of nose and nasal sinuses: Secondary | ICD-10-CM | POA: Diagnosis not present

## 2022-09-30 DIAGNOSIS — E1122 Type 2 diabetes mellitus with diabetic chronic kidney disease: Secondary | ICD-10-CM | POA: Diagnosis not present

## 2022-09-30 DIAGNOSIS — E785 Hyperlipidemia, unspecified: Secondary | ICD-10-CM | POA: Diagnosis not present

## 2022-09-30 DIAGNOSIS — I152 Hypertension secondary to endocrine disorders: Secondary | ICD-10-CM

## 2022-09-30 DIAGNOSIS — E1159 Type 2 diabetes mellitus with other circulatory complications: Secondary | ICD-10-CM | POA: Diagnosis not present

## 2022-09-30 DIAGNOSIS — N182 Chronic kidney disease, stage 2 (mild): Secondary | ICD-10-CM | POA: Diagnosis not present

## 2022-09-30 LAB — BAYER DCA HB A1C WAIVED: HB A1C (BAYER DCA - WAIVED): 7.1 % — ABNORMAL HIGH (ref 4.8–5.6)

## 2022-09-30 MED ORDER — ATORVASTATIN CALCIUM 80 MG PO TABS
ORAL_TABLET | ORAL | 3 refills | Status: DC
Start: 2022-09-30 — End: 2023-05-19

## 2022-09-30 MED ORDER — AZELASTINE HCL 0.1 % NA SOLN
1.0000 | Freq: Two times a day (BID) | NASAL | 12 refills | Status: DC
Start: 2022-09-30 — End: 2023-11-23

## 2022-09-30 MED ORDER — LOSARTAN POTASSIUM 100 MG PO TABS: 100.0000 mg | ORAL_TABLET | Freq: Every day | ORAL | 3 refills | Status: DC

## 2022-09-30 MED ORDER — LEVOTHYROXINE SODIUM 75 MCG PO TABS: 75.0000 ug | ORAL_TABLET | Freq: Every day | ORAL | 3 refills | Status: DC

## 2022-09-30 NOTE — Progress Notes (Signed)
Subjective: CC: Hypertension with hyperlipidemia PCP: Janora Norlander, DO QH:[id number] W Crecelius is a 89 y.o. female presenting to clinic today for:  1.  Diet-controlled diabetes with hypertension & hyperlipidemia Patient is brought to the office by her daughter.  The patient apparently has been managing her own medications.  Is uncertain if she is taking them appropriately.  There was some type of mixup where she was also cutting her losartan in half.  She takes only half tablet of Lipitor.  She denies any chest pain, shortness of breath or edema.  She is not taking metoprolol anymore though apparently she was on this before.  2.  Rhinorrhea Patient reports that she is been having quite a bit of allergy issues.  She has cetirizine in her back but is not sure if she is really taking it.  Not on any nasal sprays.  No fevers, chills, productive cough etc. Reported.  She reports ear fullness and difficulty hearing   ROS: Per HPI  Allergies  Allergen Reactions   Shellfish Allergy Hives   Past Medical History:  Diagnosis Date   Diabetes    DVT (deep venous thrombosis)    H/O blood clots 2000   High cholesterol    Hypothyroid    Stroke     Current Outpatient Medications:    ALPRAZolam (XANAX) 1 MG tablet, Take 0.5-1 tablets (0.5-1 mg total) by mouth at bedtime as needed for anxiety., Disp: 30 tablet, Rfl: 3   atorvastatin (LIPITOR) 80 MG tablet, TAKE (1/2) TABLET DAILY., Disp: 45 tablet, Rfl: 3   azelastine (ASTELIN) 0.1 % nasal spray, Place 1 spray into both nostrils 2 (two) times daily., Disp: 30 mL, Rfl: 12   cetirizine (ZYRTEC) 10 MG tablet, Take 10 mg by mouth daily., Disp: , Rfl:    levothyroxine (SYNTHROID) 75 MCG tablet, Take 1 tablet (75 mcg total) by mouth daily before breakfast., Disp: 100 tablet, Rfl: 3   losartan (COZAAR) 100 MG tablet, Take 1 tablet (100 mg total) by mouth daily., Disp: 100 tablet, Rfl: 3   Multiple Vitamin (MULTIVITAMIN) tablet, Take 1 tablet by mouth  daily. Centrum Women's 50+, Disp: , Rfl:    saxagliptin HCl (ONGLYZA) 5 MG TABS tablet, Take 1 tablet (5 mg total) by mouth daily. PAP w/ Julie/ CCM, Disp: 90 tablet, Rfl: 3 Social History   Socioeconomic History   Marital status: Widowed    Spouse name: Not on file   Number of children: 3   Years of education: 10   Highest education level: 10th grade  Occupational History   Occupation: retired  Tobacco Use   Smoking status: Former    Types: Cigarettes    Quit date: 02/05/1995    Years since quitting: 27.6    Passive exposure: Past   Smokeless tobacco: Never  Vaping Use   Vaping Use: Never used  Substance and Sexual Activity   Alcohol use: No    Alcohol/week: 0.0 standard drinks of alcohol   Drug use: No   Sexual activity: Not Currently    Birth control/protection: None  Other Topics Concern   Not on file  Social History Narrative   Lives alone- has a basement, but doesn't use it   Daughter lives nearby   Social Determinants of Health   Financial Resource Strain: [place]  (08/06/2022)   Overall Financial Resource Strain (CARDIA)    Difficulty of Paying Living Expenses: Not hard at all  Food Insecurity: No Food Insecurity (08/06/2022)   Hunger Vital  Sign    Worried About Charity fundraiser in the Last Year: Never true    [place] in the Last Year: Never true  Transportation Needs: No Transportation Needs (08/06/2022)   PRAPARE - Hydrologist (Medical): No    Lack of Transportation (Non-Medical): No  Physical Activity: Inactive (08/06/2022)   Exercise Vital Sign    Days of Exercise per Week: 0 days    Minutes of Exercise per Session: 0 min  Stress: No Stress Concern Present (08/06/2022)   [place]    Feeling of Stress : Not at all  Social Connections: Moderately Isolated (08/06/2022)   Social Connection and Isolation Panel [NHANES]    Frequency of Communication with Friends  and Family: More than three times a week    Frequency of Social Gatherings with Friends and Family: More than three times a week    Attends Religious Services: More than 4 times per year    Active Member of [employer] or Organizations: No    Attends Archivist Meetings: Never    Marital Status: Widowed  Intimate Partner Violence: Not At Risk (08/06/2022)   Humiliation, Afraid, Rape, and Kick questionnaire    Fear of Current or Ex-Partner: No    Emotionally Abused: No    Physically Abused: No    Sexually Abused: No   Family History  Problem Relation Age of Onset   Cancer Mother    Heart disease Father        before age 61   Heart attack Father    Diabetes Sister    Diabetes Brother     Objective: Office vital signs reviewed. BP (!) 86/48   Pulse 72   Temp 98.7 F (37.1 C)   Ht 5\' 1"  (1.549 m)   Wt 118 lb 9.6 oz (53.8 kg)   SpO2 94%   BMI 22.41 kg/m   Physical Examination:  General: Awake, alert, well nourished, No acute distress HEENT: sclera white, MMM.  TMs intact bilaterally.  Dulled light reflex bilaterally.  No effusion, erythema.  Clear rhinorrhea appreciated bilaterally.  Oropharynx without masses or exudates Cardio: regular rate and rhythm, S1S2 heard  Pulm: clear to auscultation bilaterally, no wheezes, rhonchi or rales; normal work of breathing on room air  Assessment/ Plan: 89 y.o. female   Type 2 diabetes mellitus with stage 2 chronic kidney disease, without long-term current use of insulin - Plan: Bayer DCA Hb A1c Waived  Hyperlipidemia associated with type 2 diabetes mellitus - Plan: atorvastatin (LIPITOR) 80 MG tablet  Hypertension associated with diabetes - Plan: losartan (COZAAR) 100 MG tablet  Acquired hypothyroidism - Plan: levothyroxine (SYNTHROID) 75 MCG tablet  Rhinorrhea - Plan: azelastine (ASTELIN) 0.1 % nasal spray  Sugar remains diet controlled.  No changes.  Lipitor renewed.  Losartan renewed but I would like her to hold this for  the next couple of days.  Her blood pressure was too low today.  She is asymptomatic.  We need to consider discontinuing.  Difficult to tell if patient is overusing this medication or if she simply has no need for it.  Her daughter will take over her medications and we will actually see her back on Friday for blood pressure recheck.  If normotensive, I favor discontinuing the losartan.  If blood pressure back in the 140s over 90s, may start at half tablet and we will plan to send in the 50 mg  instead of the 100 mg  Synthroid renewed.  Not yet due for thyroid levels  Rhinorrhea to be treated with Astelin.  May resume use of Zyrtec if needed for allergy symptoms  Orders Placed This Encounter  Procedures   Bayer DCA Hb A1c Waived   Meds ordered this encounter  Medications   atorvastatin (LIPITOR) 80 MG tablet    Sig: TAKE (1/2) TABLET DAILY.    Dispense:  45 tablet    Refill:  3   losartan (COZAAR) 100 MG tablet    Sig: Take 1 tablet (100 mg total) by mouth daily.    Dispense:  100 tablet    Refill:  3   levothyroxine (SYNTHROID) 75 MCG tablet    Sig: Take 1 tablet (75 mcg total) by mouth daily before breakfast.    Dispense:  100 tablet    Refill:  3   azelastine (ASTELIN) 0.1 % nasal spray    Sig: Place 1 spray into both nostrils 2 (two) times daily.    Dispense:  30 mL    Refill:  [place], [place] [phone]

## 2022-09-30 NOTE — Patient Instructions (Signed)
Astelin nasal spray added for runny nose Ok to take Cetirizine daily for allergies.

## 2022-10-02 ENCOUNTER — Ambulatory Visit: Payer: Medicare Other | Admitting: Family Medicine

## 2022-10-02 NOTE — Progress Notes (Signed)
BP checked walked into day BP 168/71 P 65 second check was 158/71. Patient states she feels fine will route to pcp

## 2022-10-14 ENCOUNTER — Ambulatory Visit: Payer: Medicare Other

## 2022-10-22 ENCOUNTER — Emergency Department (HOSPITAL_COMMUNITY): Payer: Medicare Other

## 2022-10-22 ENCOUNTER — Emergency Department (HOSPITAL_COMMUNITY)
Admission: EM | Admit: 2022-10-22 | Discharge: 2022-10-22 | Disposition: A | Discharge status: Home / Self Care | Payer: Medicare Other | Attending: Emergency Medicine | Admitting: Emergency Medicine

## 2022-10-22 ENCOUNTER — Other Ambulatory Visit: Payer: Self-pay

## 2022-10-22 DIAGNOSIS — R011 Cardiac murmur, unspecified: Secondary | ICD-10-CM | POA: Diagnosis not present

## 2022-10-22 DIAGNOSIS — L03115 Cellulitis of right lower limb: Secondary | ICD-10-CM | POA: Insufficient documentation

## 2022-10-22 DIAGNOSIS — I10 Essential (primary) hypertension: Secondary | ICD-10-CM | POA: Diagnosis not present

## 2022-10-22 DIAGNOSIS — M79671 Pain in right foot: Secondary | ICD-10-CM | POA: Diagnosis not present

## 2022-10-22 DIAGNOSIS — Z7984 Long term (current) use of oral hypoglycemic drugs: Secondary | ICD-10-CM | POA: Diagnosis not present

## 2022-10-22 DIAGNOSIS — E119 Type 2 diabetes mellitus without complications: Secondary | ICD-10-CM | POA: Diagnosis not present

## 2022-10-22 DIAGNOSIS — Z79899 Other long term (current) drug therapy: Secondary | ICD-10-CM | POA: Diagnosis not present

## 2022-10-22 DIAGNOSIS — M795 Residual foreign body in soft tissue: Secondary | ICD-10-CM | POA: Diagnosis not present

## 2022-10-22 MED ORDER — CEPHALEXIN 500 MG PO CAPS
500.0000 mg | ORAL_CAPSULE | Freq: Once | ORAL | Status: AC
  Administered 2022-10-22: 500 mg via ORAL
  Filled 2022-10-22: qty 1

## 2022-10-22 MED ORDER — CEPHALEXIN 500 MG PO CAPS: 500.0000 mg | ORAL_CAPSULE | Freq: Four times a day (QID) | ORAL | 0 refills | Status: DC

## 2022-10-22 MED ORDER — ACETAMINOPHEN 325 MG PO TABS
650.0000 mg | ORAL_TABLET | Freq: Once | ORAL | Status: AC
  Administered 2022-10-22: 650 mg via ORAL
  Filled 2022-10-22: qty 2

## 2022-10-22 MED ORDER — CEPHALEXIN 500 MG PO CAPS: 500.0000 mg | ORAL_CAPSULE | Freq: Four times a day (QID) | ORAL | 0 refills | Status: AC

## 2022-10-22 NOTE — Discharge Instructions (Signed)
You are seen today for an infection of your foot with pain.  Your x-ray showed a possible foreign body but this is not the area you have any pain or injury.  Will treat you for infection.  You can take over-the-counter medication as needed for pain.  Use the walker as needed to help with walking.  Follow-up with your doctor for wound check in a couple of days, come back if you have increased pain, increased fever or any new or worsening symptoms.

## 2022-10-22 NOTE — ED Provider Notes (Signed)
[place] EMERGENCY DEPARTMENT AT [facility] Provider Note   CSN: [id number] Arrival date & time: 10/22/22  0865     History  Chief Complaint  Patient presents with   Foot Pain    Maria Alexander is a 89 y.o. female.  History of diabetes, CVA, hypertension She presents to the ED complaining of right foot pain that started acutely last night.  She states she was in her normal state of health yesterday, went to bed and when she woke up to the bathroom she could not bear full weight on the right foot.  She is a walker was able to move some and had to call her daughter this morning who brought a walker, she is able to walk, though limited by significant pain in the right foot with a walker.  No fevers or chills or drainage, no known injury to the foot.   Foot Pain       Home Medications Prior to Admission medications   Medication Sig Start Date End Date Taking? Authorizing Provider  ALPRAZolam Prudy Feeler) 1 MG tablet Take 0.5-1 tablets (0.5-1 mg total) by mouth at bedtime as needed for anxiety. 08/21/22   [name], DO  atorvastatin (LIPITOR) 80 MG tablet TAKE (1/2) TABLET DAILY. 09/30/22   [name], DO  azelastine (ASTELIN) 0.1 % nasal spray Place 1 spray into both nostrils 2 (two) times daily. 09/30/22   [name], DO  cephALEXin (KEFLEX) 500 MG capsule Take 1 capsule (500 mg total) by mouth 4 (four) times daily for 7 days. 10/22/22 10/29/22  Carmel Sacramento A, PA-C  cetirizine (ZYRTEC) 10 MG tablet Take 10 mg by mouth daily.    [provider]  levothyroxine (SYNTHROID) 75 MCG tablet Take 1 tablet (75 mcg total) by mouth daily before breakfast. 09/30/22   Delynn Flavin M, DO  losartan (COZAAR) 100 MG tablet Take 1 tablet (100 mg total) by mouth daily. 09/30/22   [name], DO  Multiple Vitamin (MULTIVITAMIN) tablet Take 1 tablet by mouth daily. Centrum Women's 50+    [provider]  saxagliptin HCl (ONGLYZA) 5 MG TABS  tablet Take 1 tablet (5 mg total) by mouth daily. PAP w/ Julie/ CCM 07/01/22   [name], DO      Allergies    Shellfish allergy    Review of Systems   Review of Systems  Physical Exam Updated Vital Signs BP (!) 148/55   Pulse 61   Temp 97.7 F (36.5 C) (Oral)   Resp 17   Ht  (1.626 m)   Wt 52.6 kg   SpO2 97%   BMI 19.91 kg/m  Physical Exam Vitals and nursing note reviewed.  Constitutional:      General: She is not in acute distress.    Appearance: She is well-developed.  HENT:     Head: Normocephalic and atraumatic.     Mouth/Throat:     Mouth: Mucous membranes are dry.  Eyes:     Conjunctiva/sclera: Conjunctivae normal.  Cardiovascular:     Rate and Rhythm: Normal rate and regular rhythm.     Heart sounds: Murmur heard.  Pulmonary:     Effort: Pulmonary effort is normal. No respiratory distress.     Breath sounds: Normal breath sounds.  Abdominal:     Palpations: Abdomen is soft.     Tenderness: There is no abdominal tenderness.  Musculoskeletal:        General: Tenderness present. No swelling.  Cervical back: Neck supple.     Comments: Tenderness over the dorsum of the right foot  Skin:    General: Skin is warm and dry.     Capillary Refill: Capillary refill takes less than 2 seconds.     Comments: Area with surrounding cellulitis about 3 cm in diameter on dorsum of right foot. DP and PT pulse is palpable, capillary refill is brisk   Neurological:     General: No focal deficit present.     Mental Status: She is alert and oriented to person, place, and time.     Motor: No weakness.  Psychiatric:        Mood and Affect: Mood normal.     ED Results / Procedures / Treatments   Labs (all labs ordered are listed, but only abnormal results are displayed) Labs Reviewed - No data to display  EKG None  Radiology DG Foot Complete Right  Result Date: 10/22/2022 CLINICAL DATA:  Pain EXAM: RIGHT FOOT COMPLETE - 3 VIEW COMPARISON:  None  Available. FINDINGS: No fracture or dislocation. Preserved joint spaces and bone mineralization. There is a small linear density in the soft tissues medial to the base of the proximal phalanx of the great toe. Please correlate for any evidence of radiopaque foreign body or other process. IMPRESSION: 3 mm linear radiopaque density in the soft tissues medial to the proximal phalanx of the great toe. Please correlate for any known history including a potential radiopaque foreign body. Otherwise no underlying fracture or dislocation. Electronically Signed   By: Karen Kays M.D.   On: 10/22/2022 11:08    Procedures Procedures    Medications Ordered in ED Medications  acetaminophen (TYLENOL) tablet 650 mg (has no administration in time range)  cephALEXin (KEFLEX) capsule 500 mg (has no administration in time range)    ED Course/ Medical Decision Making/ A&P                             Medical Decision Making This patient presents to the ED for concern of foot pain, this involves an extensive number of treatment options, and is a complaint that carries with it a high risk of complications and morbidity.  The differential diagnosis includes fracture, contusion, abrasion, cellulitis, other   Co morbidities that complicate the patient evaluation  HTN, DM2   Additional history obtained:  Additional history obtained from EMR External records from outside source obtained and reviewed including ABIs from 1 year ago to assess for nonhealing right foot wound, these were normal   Imaging Studies ordered:  I ordered imaging studies including stray right foot I independently visualized and interpreted imaging which showed fracture, no malalignment, there is a small radiopaque area medial to the proximal phalanx of the great toe.  On exam clinically this is not an acute foreign body.  There is no overlying tenderness, no overlying puncture, no swelling.  It is not the location of patient's  discomfort. I agree with the radiologist interpretation     Problem List / ED Course / Critical interventions / Medication management  Has open area to the dorsum of the right foot with surrounding erythema that is blanching.  No crepitus.  Perfusion intact in the right foot.  X-ray shows no acute findings.  The finding of possible radiopaque foreign body does not clinically impact patient's complaint.  There is no swelling redness or tenderness in this area.  She was informed of finding  but do not feel is related to today's problem.  Will treat with Keflex and have her have close wound follow-up.  Given return precautions. I ordered medication including tylenol  for pain  Reevaluation of the patient after these medicines showed that the patient improved I have reviewed the patients home medicines and have made adjustments as needed  This chart has been completed using Engineer, civil (consulting) software, and while attempts have been made to ensure accuracy, certain words and phrases may not be transcribed as intended.    Amount and/or Complexity of Data Reviewed Radiology: ordered.  Risk OTC drugs. Prescription drug management.           Final Clinical Impression(s) / ED Diagnoses Final diagnoses:  Cellulitis of right foot    Rx / DC Orders ED Discharge Orders          Ordered    cephALEXin (KEFLEX) 500 MG capsule  4 times daily,   Status:  Discontinued        10/22/22 1158    cephALEXin (KEFLEX) 500 MG capsule  4 times daily        10/22/22 [address] 10/22/22 1207    Derwood Kaplan, MD 10/23/22 [phone]

## 2022-10-22 NOTE — ED Triage Notes (Signed)
Pt c/o right foot pain that started during the night. Pedal pulse present. No injury voiced.

## 2022-11-02 ENCOUNTER — Telehealth: Payer: Self-pay | Admitting: Pharmacist

## 2022-11-02 DIAGNOSIS — N1831 Chronic kidney disease, stage 3a: Secondary | ICD-10-CM

## 2022-11-02 NOTE — Telephone Encounter (Signed)
Mrs. Bost called today requesting refill of her Onglyza. She states that she needs this called to "[facility] in [place]". I assumed this was Bjorn Loser at [facility] but I called there and they do not have an Rx for that on file. Mrs. Mantovani had said that "Raynelle Fanning helped me before" so I assume this may be something she has gotten through an assistance program. She states she will be out of the medication at the end of the week. Of note, her twin sister died this weekend so she is naturally upset. Please call her to confirm status of the medication refill.

## 2022-11-05 MED ORDER — SITAGLIPTIN PHOSPHATE 50 MG PO TABS
50.0000 mg | ORAL_TABLET | Freq: Every day | ORAL | 5 refills | Status: DC
Start: 2022-11-05 — End: 2023-03-03

## 2022-11-05 NOTE — Telephone Encounter (Signed)
Program for onglyza has been cancelled Patient has been on [place] in the past [name] is preferred on insurance Will send in [name] 50mg  to the pharmacy Patient aware and verbalizes understanding

## 2022-11-11 ENCOUNTER — Ambulatory Visit (INDEPENDENT_AMBULATORY_CARE_PROVIDER_SITE_OTHER): Payer: Medicare Other | Admitting: *Deleted

## 2022-11-11 DIAGNOSIS — E1122 Type 2 diabetes mellitus with diabetic chronic kidney disease: Secondary | ICD-10-CM

## 2022-11-11 DIAGNOSIS — E1159 Type 2 diabetes mellitus with other circulatory complications: Secondary | ICD-10-CM

## 2022-11-11 DIAGNOSIS — C44729 Squamous cell carcinoma of skin of left lower limb, including hip: Secondary | ICD-10-CM | POA: Diagnosis not present

## 2022-11-11 NOTE — Chronic Care Management (AMB) (Signed)
Chronic Care Management   CCM RN Visit Note  11/11/2022 Name: Maria Alexander MRN: [id number] DOB: [birth year]  Subjective: Maria Alexander is a 89 y.o. year old female who is a primary care patient of [name], DO. The patient was referred to the Chronic Care Management team for assistance with care management needs subsequent to provider initiation of CCM services and plan of care.    Today's Visit:  Engaged with patient by telephone for follow up visit.        Goals Addressed             This Visit's Progress    CCM (DIABETES) EXPECTED OUTCOME:  MONITOR, SELF-MANAGE AND REDUCE SYMPTOMS OF DIABETES       Current Barriers:  Knowledge Deficits related to Diabetes management Chronic Disease Management support and education needs related to Diabetes, diet Patient reports she had not been following a special diet, states "I've been eating a lot of sweets"  Patient reports she loves sweets and this continues to be an issue for her. Patient reports she does not monitor CBG and has not been instructed to. Patient reports she can not walk long distances, has cane or walker and uses if needed  Planned Interventions: Provided education to patient about basic DM disease process; Reviewed medications with patient and discussed importance of medication adherence;        Counseled on importance of regular laboratory monitoring as prescribed;        Review of patient status, including review of consultants reports, relevant laboratory and other test results, and medications completed;       Reviewed importance of limiting/ avoiding concentrated sweets Reviewed examples of nutritious food choices and following carbohydrate modified diet/ plate method Reviewed safety precautions  Symptom Management: Take medications as prescribed   Attend all scheduled provider appointments Call pharmacy for medication refills 3-7 days in advance of running out of medications Attend church or other  social activities Perform all self care activities independently  check feet daily for cuts, sores or redness trim toenails straight across drink 6 to 8 glasses of water each day fill half of plate with vegetables limit fast food meals to no more than 1 per week read food labels for fat, fiber, carbohydrates and portion size set a realistic goal Please be careful when you walk, do not get in a hurry, keep pathways clear, ask for assistance as needed Follow a carbohydrate modified diet- too much bread, pasta, potatoes, rice at one meal can elevate your blood sugar Please avoid concentrated sweets (cakes, cookies, pies etc)  Follow Up Plan: Telephone follow up appointment with care management team member scheduled for:    01/27/23 at 215 pm       CCM (HYPERTENSION) EXPECTED OUTCOME: MONITOR, SELF-MANAGE AND REDUCE SYMPTOMS OF HYPERTENSION       Current Barriers:  Knowledge Deficits related to Hypertension management Chronic Disease Management support and education needs related to Hypertension, diet Patient reports she lives alone, has daughter nearby that assists as needed.  Patient reports she does have blood pressure cuff but does not monitor blood pressure at home, states she will try to start checking at least 2-3 x per week and record Patient reports she was exercising at [facility] until her hospitalization 04/19/22 for UTI.  Patient states she plans to hopefully start back doing some type of exercise but is having difficulty walking long distances, she is active around her house Patient reports she is trying to drink  mostly water now, drinks one diet drink daily Patient reports she went to ED 10/22/22 for right foot pain and diagnosed with cellulitis, was on keflex and has now finished, states her foot is much better, followed up with Dr. Margo Aye With medication review, pt states she is taking whole tablet of losartan  Planned Interventions: Evaluation of current treatment plan related to  hypertension self management and patient's adherence to plan as established by provider;   Reviewed medications with patient and discussed importance of compliance;  Counseled on the importance of exercise goals with target of 150 minutes per week Advised patient, providing education and rationale, to monitor blood pressure daily and record, calling PCP for findings outside established parameters;  Advised patient to discuss any issues with blood pressure, medications with provider; Discussed complications of poorly controlled blood pressure such as heart disease, stroke, circulatory complications, vision complications, kidney impairment, sexual dysfunction;  Reinforced low sodium diet and importance of reading labels Reinforced importance of drinking adequate fluids, preferably water In basket message sent to primary care provider reporting pt is taking whole tablet of losartan and is not monitoring blood pressure at home  Symptom Management: Take medications as prescribed   Attend all scheduled provider appointments Call pharmacy for medication refills 3-7 days in advance of running out of medications Attend church or other social activities Perform all self care activities independently  check blood pressure weekly choose a place to take my blood pressure (home, clinic or office, retail store) write blood pressure results in a log or diary keep a blood pressure log take blood pressure log to all doctor appointments call doctor for signs and symptoms of high blood pressure develop an action plan for high blood pressure keep all doctor appointments take medications for blood pressure exactly as prescribed report new symptoms to your doctor eat more whole grains, fruits and vegetables, lean meats and healthy fats Follow low sodium diet- limit fast food Read food labels for sodium content Continue drinking adequate fluids, preferably water Please let someone know if you feel like you  have any symptoms of urinary tract infection (burning, frequency, pain) Please start checking blood pressure at least 2-3 x per week and record, ask for help if needed  Follow Up Plan: Telephone follow up appointment with care management team member scheduled for:   01/27/23 at 215 pm          Plan:Telephone follow up appointment with care management team member scheduled for:  01/27/23 at 215 PM  Irving Shows [facility], BSN RN Case Manager [facility] [phone]

## 2022-11-11 NOTE — Patient Instructions (Signed)
Please call the care guide team at [phone] if you need to cancel or reschedule your appointment.   If you are experiencing a Mental Health or Behavioral Health Crisis or need someone to talk to, please call the Suicide and Crisis Lifeline: 988 call the [place] National Suicide Prevention Lifeline: 813-755-9224 or TTY: (534)446-7837 TTY 5626422789) to talk to a trained counselor call 1-800-273-TALK (toll free, 24 hour hotline) go to [facility] [address] [place] [phone]) call the [facility]: [phone] call 911   Following is a copy of the CCM Program Consent:  CCM service includes personalized support from designated clinical staff supervised by the physician, including individualized plan of care and coordination with other care providers 24/7 contact phone numbers for assistance for urgent and routine care needs. Service will only be billed when office clinical staff spend 20 minutes or more in a month to coordinate care. Only one practitioner may furnish and bill the service in a calendar month. The patient may stop CCM services at amy time (effective at the end of the month) by phone call to the office staff. The patient will be responsible for cost sharing (co-pay) or up to 20% of the service fee (after annual deductible is met)  Following is a copy of your full provider care plan:   Goals Addressed             This Visit's Progress    CCM (DIABETES) EXPECTED OUTCOME:  MONITOR, SELF-MANAGE AND REDUCE SYMPTOMS OF DIABETES       Current Barriers:  Knowledge Deficits related to Diabetes management Chronic Disease Management support and education needs related to Diabetes, diet Patient reports she had not been following a special diet, states "I've been eating a lot of sweets"  Patient reports she loves sweets and this continues to be an issue for her. Patient reports she does not monitor CBG and has not been  instructed to. Patient reports she can not walk long distances, has cane or walker and uses if needed  Planned Interventions: Provided education to patient about basic DM disease process; Reviewed medications with patient and discussed importance of medication adherence;        Counseled on importance of regular laboratory monitoring as prescribed;        Review of patient status, including review of consultants reports, relevant laboratory and other test results, and medications completed;       Reviewed importance of limiting/ avoiding concentrated sweets Reviewed examples of nutritious food choices and following carbohydrate modified diet/ plate method Reviewed safety precautions  Symptom Management: Take medications as prescribed   Attend all scheduled provider appointments Call pharmacy for medication refills 3-7 days in advance of running out of medications Attend church or other social activities Perform all self care activities independently  check feet daily for cuts, sores or redness trim toenails straight across drink 6 to 8 glasses of water each day fill half of plate with vegetables limit fast food meals to no more than 1 per week read food labels for fat, fiber, carbohydrates and portion size set a realistic goal Please be careful when you walk, do not get in a hurry, keep pathways clear, ask for assistance as needed Follow a carbohydrate modified diet- too much bread, pasta, potatoes, rice at one meal can elevate your blood sugar Please avoid concentrated sweets (cakes, cookies, pies etc)  Follow Up Plan: Telephone follow up appointment with care management team member scheduled for:  01/27/23 at 215 pm       CCM (HYPERTENSION) EXPECTED OUTCOME: MONITOR, SELF-MANAGE AND REDUCE SYMPTOMS OF HYPERTENSION       Current Barriers:  Knowledge Deficits related to Hypertension management Chronic Disease Management support and education needs related to Hypertension,  diet Patient reports she lives alone, has daughter nearby that assists as needed.  Patient reports she does have blood pressure cuff but does not monitor blood pressure at home, states she will try to start checking at least 2-3 x per week and record Patient reports she was exercising at [facility] until her hospitalization 04/19/22 for UTI.  Patient states she plans to hopefully start back doing some type of exercise but is having difficulty walking long distances, she is active around her house Patient reports she is trying to drink mostly water now, drinks one diet drink daily Patient reports she went to ED 10/22/22 for right foot pain and diagnosed with cellulitis, was on keflex and has now finished, states her foot is much better, followed up with Dr. Margo Aye With medication review, pt states she is taking whole tablet of losartan  Planned Interventions: Evaluation of current treatment plan related to hypertension self management and patient's adherence to plan as established by provider;   Reviewed medications with patient and discussed importance of compliance;  Counseled on the importance of exercise goals with target of 150 minutes per week Advised patient, providing education and rationale, to monitor blood pressure daily and record, calling PCP for findings outside established parameters;  Advised patient to discuss any issues with blood pressure, medications with provider; Discussed complications of poorly controlled blood pressure such as heart disease, stroke, circulatory complications, vision complications, kidney impairment, sexual dysfunction;  Reinforced low sodium diet and importance of reading labels Reinforced importance of drinking adequate fluids, preferably water In basket message sent to primary care provider reporting pt is taking whole tablet of losartan and is not monitoring blood pressure at home  Symptom Management: Take medications as prescribed   Attend all scheduled  provider appointments Call pharmacy for medication refills 3-7 days in advance of running out of medications Attend church or other social activities Perform all self care activities independently  check blood pressure weekly choose a place to take my blood pressure (home, clinic or office, retail store) write blood pressure results in a log or diary keep a blood pressure log take blood pressure log to all doctor appointments call doctor for signs and symptoms of high blood pressure develop an action plan for high blood pressure keep all doctor appointments take medications for blood pressure exactly as prescribed report new symptoms to your doctor eat more whole grains, fruits and vegetables, lean meats and healthy fats Follow low sodium diet- limit fast food Read food labels for sodium content Continue drinking adequate fluids, preferably water Please let someone know if you feel like you have any symptoms of urinary tract infection (burning, frequency, pain) Please start checking blood pressure at least 2-3 x per week and record, ask for help if needed  Follow Up Plan: Telephone follow up appointment with care management team member scheduled for:   01/27/23 at 215 pm          Patient verbalizes understanding of instructions and care plan provided today and agrees to view in MyChart. Active MyChart status and patient understanding of how to access instructions and care plan via MyChart confirmed with patient.  Telephone follow up appointment with care management team member scheduled for:  01/27/23  at 215 pm  Safety In The Home  Use a variety of textures, such as Velcro, rubber bands and raised dots to provide tactile clues.  Apply to the on/off controls on appliances, at the end of the banister, or on medicine bottles.  Flooring  The following suggestions can help reduce the risk of a fall: Repair or replace torn carpet because a foot, cane or walker can easily get  caught. Remove area carpets or throw rugs, especially if your loved one has a shuffling gait or uses a walker. When rugs  Or carpeting cannot be eliminated, place non-skid padding under rugs or secure to floor with double sided tape.  Area carpets without padding or tape can easily buckle underneath when walked on, causing a person to slip or fall. Use only matte, non-shiny finishes on the floor. Doorsills can be tripping hazards.  Remove them whenever possible or paint them a contrasting color. Eliminate low furniture that is easy to trip over such as coffee tables and footstools. Move furniture against walls to create a large area of uncluttered space in the center of the room.  However, if you are rearranging a room for someone else, discuss beforehand with that person.  Many individuals rely on specific locations of furniture to find their way around a room. It is easier to see the sofa or chair when its color contrasts with that of the flooring.  Choose a fabric that contrasts with the floor material or use a bright colored piping along the edges of the seat cushion. Reduce glare on polished furniture by covering it with a large doily or tablecloth.

## 2022-11-20 DIAGNOSIS — C44729 Squamous cell carcinoma of skin of left lower limb, including hip: Secondary | ICD-10-CM | POA: Diagnosis not present

## 2022-11-27 DIAGNOSIS — I10 Essential (primary) hypertension: Secondary | ICD-10-CM | POA: Diagnosis not present

## 2022-11-27 DIAGNOSIS — E1159 Type 2 diabetes mellitus with other circulatory complications: Secondary | ICD-10-CM | POA: Diagnosis not present

## 2022-12-03 DIAGNOSIS — C44722 Squamous cell carcinoma of skin of right lower limb, including hip: Secondary | ICD-10-CM | POA: Diagnosis not present

## 2022-12-03 DIAGNOSIS — C44629 Squamous cell carcinoma of skin of left upper limb, including shoulder: Secondary | ICD-10-CM | POA: Diagnosis not present

## 2022-12-03 DIAGNOSIS — L821 Other seborrheic keratosis: Secondary | ICD-10-CM | POA: Diagnosis not present

## 2022-12-03 DIAGNOSIS — L570 Actinic keratosis: Secondary | ICD-10-CM | POA: Diagnosis not present

## 2022-12-03 DIAGNOSIS — D485 Neoplasm of uncertain behavior of skin: Secondary | ICD-10-CM | POA: Diagnosis not present

## 2022-12-18 DIAGNOSIS — H906 Mixed conductive and sensorineural hearing loss, bilateral: Secondary | ICD-10-CM | POA: Diagnosis not present

## 2022-12-18 DIAGNOSIS — H6993 Unspecified Eustachian tube disorder, bilateral: Secondary | ICD-10-CM | POA: Diagnosis not present

## 2022-12-22 ENCOUNTER — Encounter: Payer: Self-pay | Admitting: Family Medicine

## 2022-12-28 ENCOUNTER — Other Ambulatory Visit: Payer: Self-pay | Admitting: Family Medicine

## 2022-12-28 DIAGNOSIS — F419 Anxiety disorder, unspecified: Secondary | ICD-10-CM

## 2023-01-12 ENCOUNTER — Telehealth: Payer: Self-pay | Admitting: Family Medicine

## 2023-01-12 NOTE — Telephone Encounter (Signed)
Got apt made

## 2023-01-12 NOTE — Telephone Encounter (Signed)
Pt called requesting to speak with PCP or nurse. Says she is out of her Xanax Rx and needs refills but Dr Nadine Counts doesn't have any openings until September and since it is controlled, I wasn't sure if Dr Nadine Counts would approve more refills to last pt until that appt or if she wanted to work pt in to be seen sooner.

## 2023-01-12 NOTE — Telephone Encounter (Signed)
Ok to give her that acute slot you blocked tomorrow.

## 2023-01-13 ENCOUNTER — Ambulatory Visit (INDEPENDENT_AMBULATORY_CARE_PROVIDER_SITE_OTHER): Payer: Medicare Other | Admitting: Family Medicine

## 2023-01-13 ENCOUNTER — Encounter: Payer: Self-pay | Admitting: Family Medicine

## 2023-01-13 VITALS — BP 109/61 | HR 80 | Temp 98.6°F | Ht 64.0 in | Wt 121.0 lb

## 2023-01-13 DIAGNOSIS — E785 Hyperlipidemia, unspecified: Secondary | ICD-10-CM | POA: Diagnosis not present

## 2023-01-13 DIAGNOSIS — Z79899 Other long term (current) drug therapy: Secondary | ICD-10-CM | POA: Diagnosis not present

## 2023-01-13 DIAGNOSIS — C44722 Squamous cell carcinoma of skin of right lower limb, including hip: Secondary | ICD-10-CM | POA: Diagnosis not present

## 2023-01-13 DIAGNOSIS — I152 Hypertension secondary to endocrine disorders: Secondary | ICD-10-CM | POA: Diagnosis not present

## 2023-01-13 DIAGNOSIS — E1159 Type 2 diabetes mellitus with other circulatory complications: Secondary | ICD-10-CM

## 2023-01-13 DIAGNOSIS — N182 Chronic kidney disease, stage 2 (mild): Secondary | ICD-10-CM

## 2023-01-13 DIAGNOSIS — E039 Hypothyroidism, unspecified: Secondary | ICD-10-CM

## 2023-01-13 DIAGNOSIS — E1169 Type 2 diabetes mellitus with other specified complication: Secondary | ICD-10-CM | POA: Diagnosis not present

## 2023-01-13 DIAGNOSIS — F419 Anxiety disorder, unspecified: Secondary | ICD-10-CM

## 2023-01-13 DIAGNOSIS — E1122 Type 2 diabetes mellitus with diabetic chronic kidney disease: Secondary | ICD-10-CM | POA: Diagnosis not present

## 2023-01-13 LAB — BAYER DCA HB A1C WAIVED: HB A1C (BAYER DCA - WAIVED): 7.5 % — ABNORMAL HIGH (ref 4.8–5.6)

## 2023-01-13 MED ORDER — ALPRAZOLAM 1 MG PO TABS
0.5000 mg | ORAL_TABLET | Freq: Every evening | ORAL | 3 refills | Status: DC | PRN
Start: 2023-01-13 — End: 2023-11-23

## 2023-01-14 LAB — DRUG SCREEN 10 W/CONF, SERUM

## 2023-01-14 LAB — RENAL FUNCTION PANEL
Albumin: 4 g/dL (ref 3.6–4.6)
BUN/Creatinine Ratio: 23 (ref 12–28)
Glucose: 322 mg/dL — ABNORMAL HIGH (ref 70–99)

## 2023-01-14 NOTE — Progress Notes (Signed)
Subjective: CC: Xanax refills PCP: [name], DO ZOX:WRUEAVW W Hoglund is a 89 y.o. female presenting to clinic today for:  1.  Insomnia, anxiety disorder Patient reports that she does occasionally have some heart palpitations when she gets exertional or when she is overly excited.  She takes alprazolam roughly every night at bedtime for anxiety and sleep.  She is accompanied today by her daughter who denies any changes in cognition.  She has some memory issues at baseline but this is unchanged.  She denies any falls, excessive daytime sedation, visual or auditory hallucinations  2.  Type 2 diabetes associated with hypertension hyperlipidemia Compliant with all medications.  No reports of hypoglycemic episodes.  She does not follow a diet.  Infection had a milkshake today.  She reports no chest pain, shortness of breath or dizziness today.  3.  Hypothyroidism Compliant with Synthroid.  Does have occasional anxiety and heart palpitations as above but no changes in energy, bowel habits or weight.  No difficulty swallowing reported   ROS: Per HPI  Allergies  Allergen Reactions   Shellfish Allergy Hives   Past Medical History:  Diagnosis Date   Diabetes (HCC)    DVT (deep venous thrombosis) (HCC)    H/O blood clots 2000   High cholesterol    Hypothyroid    Stroke [facility])     Current Outpatient Medications:    atorvastatin (LIPITOR) 80 MG tablet, TAKE (1/2) TABLET DAILY., Disp: 45 tablet, Rfl: 3   azelastine (ASTELIN) 0.1 % nasal spray, Place 1 spray into both nostrils 2 (two) times daily., Disp: 30 mL, Rfl: 12   cetirizine (ZYRTEC) 10 MG tablet, Take 10 mg by mouth daily., Disp: , Rfl:    levothyroxine (SYNTHROID) 75 MCG tablet, Take 1 tablet (75 mcg total) by mouth daily before breakfast., Disp: 100 tablet, Rfl: 3   losartan (COZAAR) 100 MG tablet, Take 1 tablet (100 mg total) by mouth daily. (Patient taking differently: Take 100 mg by mouth daily. 11/11/22 - pt reports she  is taking whole tablet), Disp: 100 tablet, Rfl: 3   Multiple Vitamin (MULTIVITAMIN) tablet, Take 1 tablet by mouth daily. Centrum Women's 50+, Disp: , Rfl:    sitaGLIPtin ([name]) 50 MG tablet, Take 1 tablet (50 mg total) by mouth daily., Disp: 30 tablet, Rfl: 5   ALPRAZolam (XANAX) 1 MG tablet, Take 0.5-1 tablets (0.5-1 mg total) by mouth at bedtime as needed for anxiety., Disp: 30 tablet, Rfl: 3 Social History   Socioeconomic History   Marital status: Widowed    Spouse name: Not on file   Number of children: 3   Years of education: 10   Highest education level: 10th grade  Occupational History   Occupation: retired  Tobacco Use   Smoking status: Former    Current packs/day: 0.00    Types: Cigarettes    Quit date: 02/05/1995    Years since quitting: 27.9    Passive exposure: Past   Smokeless tobacco: Never  Vaping Use   Vaping status: Never Used  Substance and Sexual Activity   Alcohol use: No    Alcohol/week: 0.0 standard drinks of alcohol   Drug use: No   Sexual activity: Not Currently    Birth control/protection: None  Other Topics Concern   Not on file  Social History Narrative   Lives alone- has a basement, but doesn't use it   Daughter lives nearby   Social Determinants of Health   Financial Resource Strain: Low Risk  (  08/06/2022)   Overall Financial Resource Strain (CARDIA)    Difficulty of Paying Living Expenses: Not hard at all  Food Insecurity: No Food Insecurity (08/06/2022)   Hunger Vital Sign    Worried About Running Out of Food in the Last Year: Never true    Ran Out of Food in the Last Year: Never true  Transportation Needs: No Transportation Needs (08/06/2022)   PRAPARE - Administrator, Civil Service (Medical): No    Lack of Transportation (Non-Medical): No  Physical Activity: Inactive (08/06/2022)   Exercise Vital Sign    Days of Exercise per Week: 0 days    Minutes of Exercise per Session: 0 min  Stress: No Stress Concern Present (08/06/2022)    [employer] of Occupational Health - Occupational Stress Questionnaire    Feeling of Stress : Not at all  Social Connections: Moderately Isolated (08/06/2022)   Social Connection and Isolation Panel [NHANES]    Frequency of Communication with Friends and Family: More than three times a week    Frequency of Social Gatherings with Friends and Family: More than three times a week    Attends Religious Services: More than 4 times per year    Active Member of [employer] or Organizations: No    Attends Banker Meetings: Never    Marital Status: Widowed  Intimate Partner Violence: Not At Risk (08/06/2022)   Humiliation, Afraid, Rape, and Kick questionnaire    Fear of Current or Ex-Partner: No    Emotionally Abused: No    Physically Abused: No    Sexually Abused: No   Family History  Problem Relation Age of Onset   Cancer Mother    Heart disease Father        before age 23   Heart attack Father    Diabetes Sister    Diabetes Brother     Objective: Office vital signs reviewed. BP 109/61   Pulse 80   Temp 98.6 F (37 C)   Ht 5\' 4"  (1.626 m)   Wt 121 lb (54.9 kg)   SpO2 93%   BMI 20.77 kg/m   Physical Examination:  General: Awake, alert, well nourished well-appearing elderly female, No acute distress HEENT: sclera white, MMM Cardio: regular rate and rhythm, S1S2 heard, 2/6 SEM appreciated Pulm: clear to auscultation bilaterally, no wheezes, rhonchi or rales; normal work of breathing on room air MSK: Ambulating with some assistance. Neuro: Very hard of hearing but responds to questions appropriately and follows commands     09/30/2022    1:27 PM 08/06/2022    1:18 PM 07/13/2022    2:43 PM  Depression screen PHQ 2/9  Decreased Interest 0 0 0  Down, Depressed, Hopeless 0 0 0  PHQ - 2 Score 0 0 0  Altered sleeping 0    Tired, decreased energy 0    Change in appetite 0    Feeling bad or failure about yourself  0    Trouble concentrating 0    Moving slowly or  fidgety/restless 0    Suicidal thoughts 0    PHQ-9 Score 0        09/30/2022    1:27 PM 07/01/2022    9:38 AM 02/27/2022    9:39 AM 10/27/2021    9:45 AM  GAD 7 : Generalized Anxiety Score  Nervous, Anxious, on Edge 0 0 0 0  Control/stop worrying 0 0 0 0  Worry too much - different things 0 0 0 0  Trouble  relaxing 0 0 0 0  Restless 0 0 0 0  Easily annoyed or irritable 0 0 0 0  Afraid - awful might happen 0 0 0 0  Total GAD 7 Score 0 0 0 0  Anxiety Difficulty Not difficult at all Not difficult at all Not difficult at all Not difficult at all   Assessment/ Plan: 89 y.o. female   Anxiety - Plan: ALPRAZolam (XANAX) 1 MG tablet, Drug Screen 10 W/Conf, Se, CANCELED: ToxASSURE Select 13 (MW), Urine  High risk medication use - Plan: Drug Screen 10 W/Conf, Se, CANCELED: ToxASSURE Select 13 (MW), Urine  Chronically on benzodiazepine therapy - Plan: Drug Screen 10 W/Conf, Se, CANCELED: ToxASSURE Select 13 (MW), Urine  Controlled substance agreement signed - Plan: Drug Screen 10 W/Conf, Se, CANCELED: ToxASSURE Select 13 (MW), Urine  Type 2 diabetes mellitus with stage 2 chronic kidney disease, without long-term current use of insulin (HCC) - Plan: Bayer DCA Hb A1c Waived, Renal Function Panel  Hypertension associated with diabetes (HCC) - Plan: Renal Function Panel  Acquired hypothyroidism - Plan: TSH, T4, Free  Drug screen and controlled substance contract updated as per office policy.  National narcotic database reviewed and there were no red flags.  Alprazolam renewed.  Her symptoms are chronic and stable she has had no red flag signs or symptoms though they understand high risk of ongoing benzodiazepine use at her age  Diabetes is controlled for age.  Discussed goal of A1c less than 7.9.  No changes needed.  Check renal function  Blood pressure controlled.  No changes needed  Check thyroid levels.  Follow-up in 4 months, sooner if concerns arise   Orders Placed This Encounter   Procedures   Bayer DCA Hb A1c Waived   TSH   T4, Free   Renal Function Panel   Drug Screen 10 W/Conf, Se   Meds ordered this encounter  Medications   ALPRAZolam (XANAX) 1 MG tablet    Sig: Take 0.5-1 tablets (0.5-1 mg total) by mouth at bedtime as needed for anxiety.    Dispense:  30 tablet    Refill:  3     Talani Brazee Hulen Skains, DO [facility] [phone]

## 2023-01-17 LAB — DRUG SCREEN 10 W/CONF, SERUM
Amphetamines, IA: NEGATIVE ng/mL
Barbiturates, IA: NEGATIVE ug/mL
Cocaine & Metabolite, IA: NEGATIVE ng/mL
Methadone, IA: NEGATIVE ng/mL
Opiates, IA: NEGATIVE ng/mL
Oxycodones, IA: NEGATIVE ng/mL
THC(Marijuana) Metabolite, IA: NEGATIVE ng/mL

## 2023-01-17 LAB — RENAL FUNCTION PANEL
CO2: 23 mmol/L (ref 20–29)
Chloride: 100 mmol/L (ref 96–106)
Creatinine, Ser: 0.79 mg/dL (ref 0.57–1.00)
Potassium: 4.4 mmol/L (ref 3.5–5.2)
eGFR: 68 mL/min/{1.73_m2} (ref >59)

## 2023-01-17 LAB — TSH: TSH: 0.48 u[IU]/mL (ref 0.450–4.500)

## 2023-01-17 LAB — T4, FREE: Free T4: 1.42 ng/dL (ref 0.82–1.77)

## 2023-01-21 DIAGNOSIS — H6993 Unspecified Eustachian tube disorder, bilateral: Secondary | ICD-10-CM | POA: Diagnosis not present

## 2023-01-21 DIAGNOSIS — H906 Mixed conductive and sensorineural hearing loss, bilateral: Secondary | ICD-10-CM | POA: Diagnosis not present

## 2023-01-21 LAB — RENAL FUNCTION PANEL
BUN: 18 mg/dL (ref 10–36)
Calcium: 9.8 mg/dL (ref 8.7–10.3)
Phosphorus: 3.5 mg/dL (ref 3.0–4.3)
Sodium: 137 mmol/L (ref 134–144)

## 2023-01-21 LAB — BENZODIAZEPINES,MS,WB/SP RFX
7-Aminoclonazepam: NEGATIVE ng/mL
Alprazolam: 19.3 ng/mL
Chlordiazepoxide: NEGATIVE
Clonazepam: NEGATIVE ng/mL
Desalkylflurazepam: NEGATIVE ng/mL
Desmethylchlordiazepoxide: NEGATIVE
Desmethyldiazepam: NEGATIVE ng/mL
Diazepam: NEGATIVE ng/mL
Flurazepam: NEGATIVE ng/mL
Lorazepam: NEGATIVE ng/mL
Temazepam: NEGATIVE ng/mL

## 2023-01-21 LAB — DRUG SCREEN 10 W/CONF, SERUM: Propoxyphene, IA: NEGATIVE ng/mL

## 2023-01-27 ENCOUNTER — Telehealth: Payer: Medicare Other

## 2023-01-27 ENCOUNTER — Telehealth: Payer: Self-pay | Admitting: *Deleted

## 2023-01-27 DIAGNOSIS — C4442 Squamous cell carcinoma of skin of scalp and neck: Secondary | ICD-10-CM | POA: Diagnosis not present

## 2023-01-27 DIAGNOSIS — D485 Neoplasm of uncertain behavior of skin: Secondary | ICD-10-CM | POA: Diagnosis not present

## 2023-01-27 DIAGNOSIS — C44629 Squamous cell carcinoma of skin of left upper limb, including shoulder: Secondary | ICD-10-CM | POA: Diagnosis not present

## 2023-01-27 NOTE — Telephone Encounter (Signed)
   CCM RN Visit Note    01/27/23  Name: Maria Alexander MRN: [id number]      DOB: [birth year]  Subjective: Maria Alexander is a 89 y.o. year old female who is a primary care patient of Delynn Flavin DO. The patient was referred to the Chronic Care Management team for assistance with care management needs subsequent to provider initiation of CCM services and plan of care.      An unsuccessful telephone outreach was attempted today to contact the patient about Chronic Care Management needs.    Plan:Telephone follow up appointment with care management team member scheduled for:  upon care guide rescheduling.  Irving Shows RNC, BSN RN Case Manager [facility] [phone]

## 2023-02-08 DIAGNOSIS — Z08 Encounter for follow-up examination after completed treatment for malignant neoplasm: Secondary | ICD-10-CM | POA: Diagnosis not present

## 2023-02-08 DIAGNOSIS — D485 Neoplasm of uncertain behavior of skin: Secondary | ICD-10-CM | POA: Diagnosis not present

## 2023-02-08 DIAGNOSIS — C4442 Squamous cell carcinoma of skin of scalp and neck: Secondary | ICD-10-CM | POA: Diagnosis not present

## 2023-02-08 DIAGNOSIS — Z85828 Personal history of other malignant neoplasm of skin: Secondary | ICD-10-CM | POA: Diagnosis not present

## 2023-02-08 DIAGNOSIS — B079 Viral wart, unspecified: Secondary | ICD-10-CM | POA: Diagnosis not present

## 2023-02-08 DIAGNOSIS — R229 Localized swelling, mass and lump, unspecified: Secondary | ICD-10-CM | POA: Diagnosis not present

## 2023-02-08 DIAGNOSIS — L821 Other seborrheic keratosis: Secondary | ICD-10-CM | POA: Diagnosis not present

## 2023-02-08 DIAGNOSIS — C44612 Basal cell carcinoma of skin of right upper limb, including shoulder: Secondary | ICD-10-CM | POA: Diagnosis not present

## 2023-02-15 ENCOUNTER — Other Ambulatory Visit: Payer: Medicare Other | Admitting: *Deleted

## 2023-02-15 ENCOUNTER — Encounter: Payer: Self-pay | Admitting: *Deleted

## 2023-02-15 NOTE — Patient Instructions (Signed)
Visit Information  Thank you for taking time to visit with me today. Please don't hesitate to contact me if I can be of assistance to you before our next scheduled telephone appointment.  Following are the goals we discussed today:   Goals Addressed             This Visit's Progress    CCM (DIABETES) EXPECTED OUTCOME:  MONITOR, SELF-MANAGE AND REDUCE SYMPTOMS OF DIABETES       Current Barriers:  Knowledge Deficits related to Diabetes management Chronic Disease Management support and education needs related to Diabetes, diet Patient reports she had not been following a special diet, sometimes eats sweets  Patient reports she loves sweets and this has been an ongoing issue for her. Patient reports she does not monitor CBG and has not been instructed to. Patient reports she can not walk long distances, has cane or walker and uses if needed Patient reports she has been following up with "ear specialist" for hearing issues and states ears "were cleaned out", pt states she feels she can hear better today over the phone, pt is to follow up on 02/22/23 and states plan of care will be discussed and not sure if hearing aides will be an option.   Planned Interventions: Provided education to patient about basic DM disease process; Reviewed medications with patient and discussed importance of medication adherence;        Counseled on importance of regular laboratory monitoring as prescribed;        Review of patient status, including review of consultants reports, relevant laboratory and other test results, and medications completed;       Reinforced importance of limiting/ avoiding concentrated sweets Reinforced examples of nutritious food choices and following carbohydrate modified diet/ plate method Reinforced safety precautions  Symptom Management: Take medications as prescribed   Attend all scheduled provider appointments Call pharmacy for medication refills 3-7 days in advance of running out  of medications Attend church or other social activities Perform all self care activities independently  check feet daily for cuts, sores or redness trim toenails straight across drink 6 to 8 glasses of water each day fill half of plate with vegetables limit fast food meals to no more than 1 per week read food labels for fat, fiber, carbohydrates and portion size set a realistic goal Please be careful when you walk, do not get in a hurry, keep pathways clear, ask for assistance as needed Follow a carbohydrate modified diet- too much bread, pasta, potatoes, rice at one meal can elevate your blood sugar Please avoid concentrated sweets (cakes, cookies, pies etc)  Follow Up Plan: Telephone follow up appointment with care management team member scheduled for:    05/05/23 at 1045 am       CCM (HYPERTENSION) EXPECTED OUTCOME: MONITOR, SELF-MANAGE AND REDUCE SYMPTOMS OF HYPERTENSION       Current Barriers:  Knowledge Deficits related to Hypertension management Chronic Disease Management support and education needs related to Hypertension, diet Patient reports she lives alone, has daughter nearby that assists as needed.  Patient reports she does have blood pressure cuff but does not monitor blood pressure at home Patient reports she was exercising at [facility] until her hospitalization 04/19/22 for UTI.  Patient states she plans to hopefully start back doing some type of exercise but is having difficulty walking long distances, she is active around her house Patient reports she is trying to drink mostly water now, drinks one diet drink daily Patient reports she  went to ED 10/22/22 for right foot pain and diagnosed with cellulitis, was on keflex and has now finished, states her foot is much better, followed up with Dr. Margo Aye With medication review, pt states she has all medications and taking as prescribed  Planned Interventions: Evaluation of current treatment plan related to hypertension self management  and patient's adherence to plan as established by provider;   Reviewed medications with patient and discussed importance of compliance;  Counseled on the importance of exercise goals with target of 150 minutes per week Advised patient, providing education and rationale, to monitor blood pressure daily and record, calling PCP for findings outside established parameters;  Advised patient to discuss any issues with blood pressure, medications with provider; Discussed complications of poorly controlled blood pressure such as heart disease, stroke, circulatory complications, vision complications, kidney impairment, sexual dysfunction;  Reviewed low sodium diet and importance of reading labels Reviewed importance of drinking adequate fluids, preferably water  Symptom Management: Take medications as prescribed   Attend all scheduled provider appointments Call pharmacy for medication refills 3-7 days in advance of running out of medications Attend church or other social activities Perform all self care activities independently  check blood pressure weekly choose a place to take my blood pressure (home, clinic or office, retail store) write blood pressure results in a log or diary keep a blood pressure log take blood pressure log to all doctor appointments call doctor for signs and symptoms of high blood pressure develop an action plan for high blood pressure keep all doctor appointments take medications for blood pressure exactly as prescribed report new symptoms to your doctor eat more whole grains, fruits and vegetables, lean meats and healthy fats Follow low sodium diet- limit fast food Read food labels for sodium content Continue drinking adequate fluids, preferably water Please let someone know if you feel like you have any symptoms of urinary tract infection (burning, frequency, pain) Please start checking blood pressure at least 2-3 x per week and record, ask for help if needed  Follow  Up Plan: Telephone follow up appointment with care management team member scheduled for:   05/05/23 at 1045 am           Our next appointment is by telephone on 05/05/23 at 1045 am  Please call the care guide team at [phone] if you need to cancel or reschedule your appointment.   If you are experiencing a Mental Health or Behavioral Health Crisis or need someone to talk to, please call the Suicide and Crisis Lifeline: 988 call the [place] National Suicide Prevention Lifeline: (678)681-7315 or TTY: 725-633-5133 TTY (705)203-9607) to talk to a trained counselor call 1-800-273-TALK (toll free, 24 hour hotline) go to [facility] [address] [place] [phone]) call the [facility] Crisis Line: [phone] call 911   Patient verbalizes understanding of instructions and care plan provided today and agrees to view in MyChart. Active MyChart status and patient understanding of how to access instructions and care plan via MyChart confirmed with patient.     Telephone follow up appointment with care management team member scheduled for: 05/05/23 at 1045 am  Irving Shows [facility], BSN [place]/ Ambulatory Care Management [phone]

## 2023-02-15 NOTE — Patient Outreach (Signed)
Care Management   Visit Note  02/15/2023 Name: Maria Alexander MRN: [id number] DOB: Nov 29, 1923  Subjective: Maria Alexander is a 89 y.o. year old female who is a primary care patient of Maria Ip, DO. The Care Management team was consulted for assistance.      Engaged with patient spoke with patient by telephone for follow up   Goals Addressed             This Visit's Progress    CCM (DIABETES) EXPECTED OUTCOME:  MONITOR, SELF-MANAGE AND REDUCE SYMPTOMS OF DIABETES       Current Barriers:  Knowledge Deficits related to Diabetes management Chronic Disease Management support and education needs related to Diabetes, diet Patient reports she had not been following a special diet, sometimes eats sweets  Patient reports she loves sweets and this has been an ongoing issue for her. Patient reports she does not monitor CBG and has not been instructed to. Patient reports she can not walk long distances, has cane or walker and uses if needed Patient reports she has been following up with "ear specialist" for hearing issues and states ears "were cleaned out", pt states she feels she can hear better today over the phone, pt is to follow up on 02/22/23 and states plan of care will be discussed and not sure if hearing aides will be an option.   Planned Interventions: Provided education to patient about basic DM disease process; Reviewed medications with patient and discussed importance of medication adherence;        Counseled on importance of regular laboratory monitoring as prescribed;        Review of patient status, including review of consultants reports, relevant laboratory and other test results, and medications completed;       Reinforced importance of limiting/ avoiding concentrated sweets Reinforced examples of nutritious food choices and following carbohydrate modified diet/ plate method Reinforced safety precautions  Symptom Management: Take medications as prescribed    Attend all scheduled provider appointments Call pharmacy for medication refills 3-7 days in advance of running out of medications Attend church or other social activities Perform all self care activities independently  check feet daily for cuts, sores or redness trim toenails straight across drink 6 to 8 glasses of water each day fill half of plate with vegetables limit fast food meals to no more than 1 per week read food labels for fat, fiber, carbohydrates and portion size set a realistic goal Please be careful when you walk, do not get in a hurry, keep pathways clear, ask for assistance as needed Follow a carbohydrate modified diet- too much bread, pasta, potatoes, rice at one meal can elevate your blood sugar Please avoid concentrated sweets (cakes, cookies, pies etc)  Follow Up Plan: Telephone follow up appointment with care management team member scheduled for:    05/05/23 at 1045 am       CCM (HYPERTENSION) EXPECTED OUTCOME: MONITOR, SELF-MANAGE AND REDUCE SYMPTOMS OF HYPERTENSION       Current Barriers:  Knowledge Deficits related to Hypertension management Chronic Disease Management support and education needs related to Hypertension, diet Patient reports she lives alone, has daughter nearby that assists as needed.  Patient reports she does have blood pressure cuff but does not monitor blood pressure at home Patient reports she was exercising at [facility] until her hospitalization 04/19/22 for UTI.  Patient states she plans to hopefully start back doing some type of exercise but is having difficulty walking long distances, she is  active around her house Patient reports she is trying to drink mostly water now, drinks one diet drink daily Patient reports she went to ED 10/22/22 for right foot pain and diagnosed with cellulitis, was on keflex and has now finished, states her foot is much better, followed up with Dr. Margo Aye With medication review, pt states she has all medications and taking  as prescribed  Planned Interventions: Evaluation of current treatment plan related to hypertension self management and patient's adherence to plan as established by provider;   Reviewed medications with patient and discussed importance of compliance;  Counseled on the importance of exercise goals with target of 150 minutes per week Advised patient, providing education and rationale, to monitor blood pressure daily and record, calling PCP for findings outside established parameters;  Advised patient to discuss any issues with blood pressure, medications with provider; Discussed complications of poorly controlled blood pressure such as heart disease, stroke, circulatory complications, vision complications, kidney impairment, sexual dysfunction;  Reviewed low sodium diet and importance of reading labels Reviewed importance of drinking adequate fluids, preferably water  Symptom Management: Take medications as prescribed   Attend all scheduled provider appointments Call pharmacy for medication refills 3-7 days in advance of running out of medications Attend church or other social activities Perform all self care activities independently  check blood pressure weekly choose a place to take my blood pressure (home, clinic or office, retail store) write blood pressure results in a log or diary keep a blood pressure log take blood pressure log to all doctor appointments call doctor for signs and symptoms of high blood pressure develop an action plan for high blood pressure keep all doctor appointments take medications for blood pressure exactly as prescribed report new symptoms to your doctor eat more whole grains, fruits and vegetables, lean meats and healthy fats Follow low sodium diet- limit fast food Read food labels for sodium content Continue drinking adequate fluids, preferably water Please let someone know if you feel like you have any symptoms of urinary tract infection (burning,  frequency, pain) Please start checking blood pressure at least 2-3 x per week and record, ask for help if needed  Follow Up Plan: Telephone follow up appointment with care management team member scheduled for:   05/05/23 at 1045 am           Plan: Telephone follow up appointment with care management team member scheduled for:  05/05/23 at 1045 am  Irving Shows [facility], BSN [place]/ Ambulatory Care Management [phone]

## 2023-02-23 DIAGNOSIS — C4442 Squamous cell carcinoma of skin of scalp and neck: Secondary | ICD-10-CM | POA: Diagnosis not present

## 2023-02-26 DIAGNOSIS — H6993 Unspecified Eustachian tube disorder, bilateral: Secondary | ICD-10-CM | POA: Diagnosis not present

## 2023-02-26 DIAGNOSIS — H906 Mixed conductive and sensorineural hearing loss, bilateral: Secondary | ICD-10-CM | POA: Diagnosis not present

## 2023-03-03 ENCOUNTER — Other Ambulatory Visit: Payer: Self-pay | Admitting: Family Medicine

## 2023-03-03 DIAGNOSIS — N1831 Chronic kidney disease, stage 3a: Secondary | ICD-10-CM

## 2023-03-08 DIAGNOSIS — D0439 Carcinoma in situ of skin of other parts of face: Secondary | ICD-10-CM | POA: Diagnosis not present

## 2023-03-24 DIAGNOSIS — C44612 Basal cell carcinoma of skin of right upper limb, including shoulder: Secondary | ICD-10-CM | POA: Diagnosis not present

## 2023-03-25 IMAGING — DX DG TIBIA/FIBULA PORT 2V*R*
2 series · 2 of 2 positions shown · non-contrast
Comparison: None.

CLINICAL DATA: Right lower extremity cellulitis with nonhealing
wound and squamous cell carcinoma

EXAM:
PORTABLE RIGHT TIBIA AND FIBULA - 2 VIEW

[tibia ap (1 of 2)]
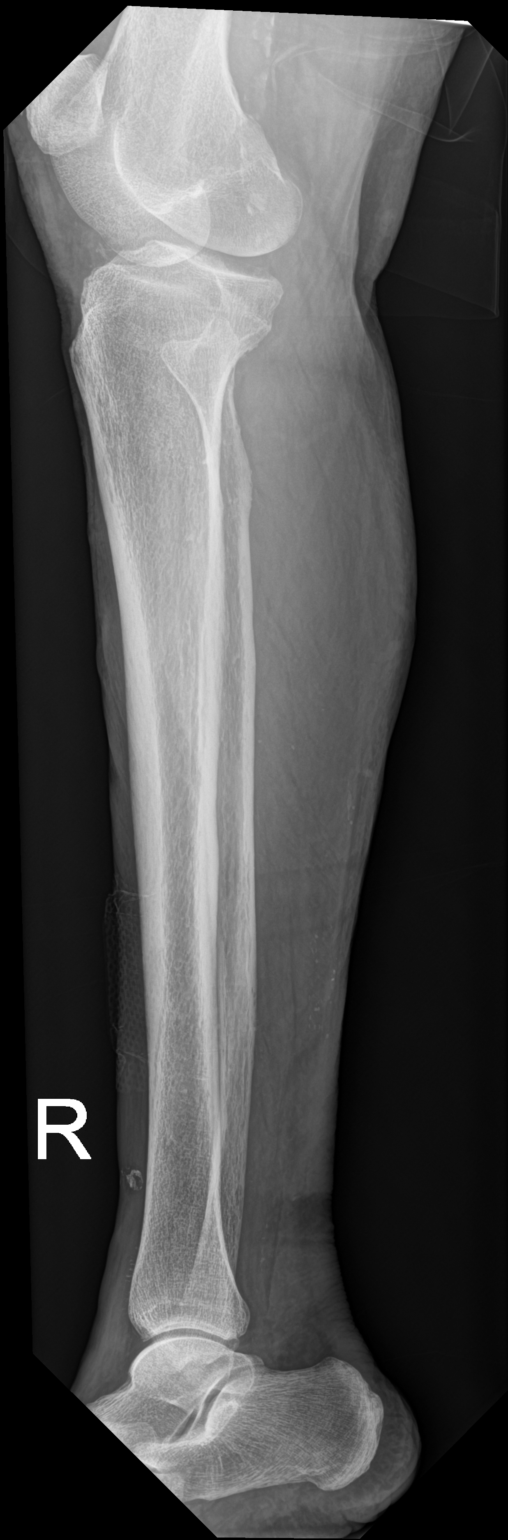

[tibia ap (2 of 2)]
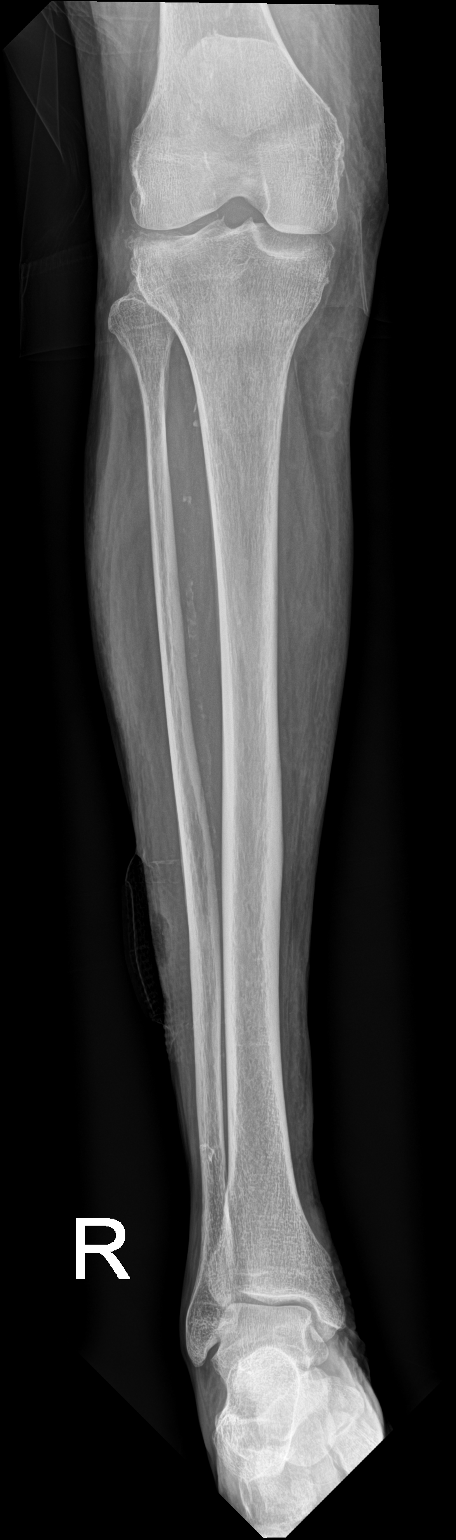

[2 of 2 positions shown; findings below may reference images not displayed]

FINDINGS: Focal wound is noted along the anterolateral aspect of the right
lower leg consistent with the given clinical history. No bony
erosive changes are seen. No fracture or dislocation is noted.
IMPRESSION: Soft tissue wound consistent with the given clinical history. No
acute bony abnormality is noted.

## 2023-05-05 ENCOUNTER — Other Ambulatory Visit: Payer: Self-pay | Admitting: *Deleted

## 2023-05-05 ENCOUNTER — Other Ambulatory Visit: Payer: Self-pay

## 2023-05-05 ENCOUNTER — Encounter: Payer: Self-pay | Admitting: *Deleted

## 2023-05-05 NOTE — Patient Instructions (Signed)
Visit Information  Thank you for taking time to visit with me today. Please don't hesitate to contact me if I can be of assistance to you before our next scheduled telephone appointment.  Following are the goals we discussed today:   Goals Addressed             This Visit's Progress    RNCM Care Management (DIABETES) EXPECTED OUTCOME:  MONITOR, SELF-MANAGE AND REDUCE SYMPTOMS OF DIABETES       Current Barriers:  Knowledge Deficits related to Diabetes management Chronic Disease Management support and education needs related to Diabetes, diet  Planned Interventions: Lab Results  Component Value Date   HGBA1C 7.5 (H) 01/13/2023     Provided education to patient about basic DM disease process. Reviewed medications with patient and discussed importance of medication adherence;  Does not check her blood sugars.       Counseled on importance of regular laboratory monitoring as prescribed; Scheduled for labs on 11/20.       Discussed plans with patient for ongoing care management follow up and provided patient with direct contact information for care management team;      Provided patient with written educational materials related to hypo and hyperglycemia and importance of correct treatment;       call provider for findings outside established parameters;       Review of patient status, including review of consultants reports, relevant laboratory and other test results, and medications completed;       Screening for signs and symptoms of depression related to chronic disease state;        Assessed social determinant of health barriers;        Reinforced importance of limiting/ avoiding concentrated sweets. Patient reports she had not been following a special diet, sometimes eats sweets  Patient reports she loves sweets and this has been an ongoing issue for her. Reinforced examples of nutritious food choices and following carbohydrate modified diet/ plate method Reinforced safety  precautions  Symptom Management: Take medications as prescribed   Attend all scheduled provider appointments Call pharmacy for medication refills 3-7 days in advance of running out of medications Attend church or other social activities Perform all self care activities independently  check feet daily for cuts, sores or redness trim toenails straight across drink 6 to 8 glasses of water each day fill half of plate with vegetables limit fast food meals to no more than 1 per week read food labels for fat, fiber, carbohydrates and portion size set a realistic goal Please be careful when you walk, do not get in a hurry, keep pathways clear, ask for assistance as needed Follow a carbohydrate modified diet- too much bread, pasta, potatoes, rice at one meal can elevate your blood sugar Please avoid concentrated sweets (cakes, cookies, pies etc)  Follow Up Plan: Telephone follow up appointment with care management team member scheduled for:    07-07-2023 at 10:30 am       RNCM Care Management (HYPERTENSION) EXPECTED OUTCOME: MONITOR, SELF-MANAGE AND REDUCE SYMPTOMS OF HYPERTENSION       Current Barriers:  Knowledge Deficits related to Hypertension Chronic Disease Management support and education needs related to Hypertension, diet Lives alone, hard of hearing   Planned Interventions: Evaluation of current treatment plan related to hypertension self management and patient's adherence to plan as established by provider. Has blood pressure cuff, does not regularly check blood pressure. Reviewed medications with patient and discussed importance of compliance. The patient is complaint  with all medications. Counseled on the importance of exercise goals with target of 150 minutes per week. Unable to exercise as she previously did due to back pain. Advised patient, providing education and rationale, to monitor blood pressure daily and record, calling PCP for findings outside established parameters.  Does not check BP or keep log. Advised patient to discuss any issues with blood pressure, medications with provider; Discussed complications of poorly controlled blood pressure such as heart disease, stroke, circulatory complications, vision complications, kidney impairment, sexual dysfunction;  Reviewed low sodium diet and importance of reading labels Reviewed importance of drinking adequate fluids, preferably water  Symptom Management: Take medications as prescribed   Attend all scheduled provider appointments Call pharmacy for medication refills 3-7 days in advance of running out of medications Attend church or other social activities Perform all self care activities independently  check blood pressure weekly choose a place to take my blood pressure (home, clinic or office, retail store) write blood pressure results in a log or diary keep a blood pressure log take blood pressure log to all doctor appointments call doctor for signs and symptoms of high blood pressure develop an action plan for high blood pressure keep all doctor appointments take medications for blood pressure exactly as prescribed report new symptoms to your doctor eat more whole grains, fruits and vegetables, lean meats and healthy fats Follow low sodium diet- limit fast food Read food labels for sodium content Continue drinking adequate fluids, preferably water   Follow Up Plan: Telephone follow up appointment with care management team member scheduled for:   07-07-2023 at 10:30 am           Our next appointment is by telephone on 07-07-2023 at 10:30 am  Please call the care guide team at [phone] if you need to cancel or reschedule your appointment.   If you are experiencing a Mental Health or Behavioral Health Crisis or need someone to talk to, please call the Suicide and Crisis Lifeline: 988 call the [place] National Suicide Prevention Lifeline: 306-669-4862 or TTY: 832-514-6907 TTY 647-745-1467) to  talk to a trained counselor call 1-800-273-TALK (toll free, 24 hour hotline) call the [facility]: [phone] call 911   Patient verbalizes understanding of instructions and care plan provided today and agrees to view in MyChart. Active MyChart status and patient understanding of how to access instructions and care plan via MyChart confirmed with patient.     Telephone follow up appointment with care management team member scheduled for: 07-07-2023 at 10:30 am  Danise Edge, BSN RN RN Care Manager  [facility]  Ambulatory Care Management  Direct Number: [phone]

## 2023-05-05 NOTE — Patient Outreach (Signed)
Care Management   Visit Note  05/05/2023 Name: Maria Alexander MRN: [id number] DOB: 16-Aug-1923  Subjective: Maria Alexander is a 89 y.o. year old female who is a primary care patient of [name], DO. The Care Management team was consulted for assistance.      Engaged with patient spoke with patient by telephone.    Goals Addressed             This Visit's Progress    RNCM Care Management (DIABETES) EXPECTED OUTCOME:  MONITOR, SELF-MANAGE AND REDUCE SYMPTOMS OF DIABETES       Current Barriers:  Knowledge Deficits related to Diabetes management Chronic Disease Management support and education needs related to Diabetes, diet  Planned Interventions: Lab Results  Component Value Date   HGBA1C 7.5 (H) 01/13/2023     Provided education to patient about basic DM disease process. Reviewed medications with patient and discussed importance of medication adherence;  Does not check her blood sugars.       Counseled on importance of regular laboratory monitoring as prescribed; Scheduled for labs on 11/20.       Discussed plans with patient for ongoing care management follow up and provided patient with direct contact information for care management team;      Provided patient with written educational materials related to hypo and hyperglycemia and importance of correct treatment;       call provider for findings outside established parameters;       Review of patient status, including review of consultants reports, relevant laboratory and other test results, and medications completed;       Screening for signs and symptoms of depression related to chronic disease state;        Assessed social determinant of health barriers;        Reinforced importance of limiting/ avoiding concentrated sweets. Patient reports she had not been following a special diet, sometimes eats sweets  Patient reports she loves sweets and this has been an ongoing issue for her. Reinforced examples of nutritious  food choices and following carbohydrate modified diet/ plate method Reinforced safety precautions  Symptom Management: Take medications as prescribed   Attend all scheduled provider appointments Call pharmacy for medication refills 3-7 days in advance of running out of medications Attend church or other social activities Perform all self care activities independently  check feet daily for cuts, sores or redness trim toenails straight across drink 6 to 8 glasses of water each day fill half of plate with vegetables limit fast food meals to no more than 1 per week read food labels for fat, fiber, carbohydrates and portion size set a realistic goal Please be careful when you walk, do not get in a hurry, keep pathways clear, ask for assistance as needed Follow a carbohydrate modified diet- too much bread, pasta, potatoes, rice at one meal can elevate your blood sugar Please avoid concentrated sweets (cakes, cookies, pies etc)  Follow Up Plan: Telephone follow up appointment with care management team member scheduled for:    07-07-2023 at 10:30 am       RNCM Care Management (HYPERTENSION) EXPECTED OUTCOME: MONITOR, SELF-MANAGE AND REDUCE SYMPTOMS OF HYPERTENSION       Current Barriers:  Knowledge Deficits related to Hypertension Chronic Disease Management support and education needs related to Hypertension, diet Lives alone, hard of hearing   Planned Interventions: Evaluation of current treatment plan related to hypertension self management and patient's adherence to plan as established by provider. Has blood pressure  cuff, does not regularly check blood pressure. Reviewed medications with patient and discussed importance of compliance. The patient is complaint with all medications. Counseled on the importance of exercise goals with target of 150 minutes per week. Unable to exercise as she previously did due to back pain. Advised patient, providing education and rationale, to monitor blood  pressure daily and record, calling PCP for findings outside established parameters. Does not check BP or keep log. Advised patient to discuss any issues with blood pressure, medications with provider; Discussed complications of poorly controlled blood pressure such as heart disease, stroke, circulatory complications, vision complications, kidney impairment, sexual dysfunction;  Reviewed low sodium diet and importance of reading labels Reviewed importance of drinking adequate fluids, preferably water  Symptom Management: Take medications as prescribed   Attend all scheduled provider appointments Call pharmacy for medication refills 3-7 days in advance of running out of medications Attend church or other social activities Perform all self care activities independently  check blood pressure weekly choose a place to take my blood pressure (home, clinic or office, retail store) write blood pressure results in a log or diary keep a blood pressure log take blood pressure log to all doctor appointments call doctor for signs and symptoms of high blood pressure develop an action plan for high blood pressure keep all doctor appointments take medications for blood pressure exactly as prescribed report new symptoms to your doctor eat more whole grains, fruits and vegetables, lean meats and healthy fats Follow low sodium diet- limit fast food Read food labels for sodium content Continue drinking adequate fluids, preferably water   Follow Up Plan: Telephone follow up appointment with care management team member scheduled for:   07-07-2023 at 10:30 am             Consent to Services:  Patient was given information about care management services, agreed to services, and gave verbal consent to participate.   Plan: Telephone follow up appointment with care management team member scheduled for: 07-07-2023 at 10:30 am  Danise Edge, BSN RN RN Care Manager  [facility]  Ambulatory Care Management   Direct Number: [phone]

## 2023-05-18 ENCOUNTER — Other Ambulatory Visit: Payer: Self-pay | Admitting: Pharmacist

## 2023-05-18 NOTE — Progress Notes (Signed)
Pharmacy Quality Measure Review  This patient is appearing on a report for being at risk of failing the adherence measure for cholesterol (statin) medications this calendar year.   Medication: atorvastatin 80 mg  Last fill date: today for 90 day supply  Insurance report was not up to date. No action needed at this time.   Catie Eppie Gibson, PharmD, BCACP, CPP Clinical Pharmacist [facility] [phone]

## 2023-05-19 ENCOUNTER — Encounter: Payer: Self-pay | Admitting: Family Medicine

## 2023-05-19 ENCOUNTER — Ambulatory Visit: Payer: Medicare Other | Admitting: Family Medicine

## 2023-05-19 VITALS — BP 139/77 | HR 72 | Temp 98.7°F | Ht 64.0 in | Wt 121.0 lb

## 2023-05-19 DIAGNOSIS — E1169 Type 2 diabetes mellitus with other specified complication: Secondary | ICD-10-CM | POA: Diagnosis not present

## 2023-05-19 DIAGNOSIS — I152 Hypertension secondary to endocrine disorders: Secondary | ICD-10-CM

## 2023-05-19 DIAGNOSIS — N1831 Chronic kidney disease, stage 3a: Secondary | ICD-10-CM | POA: Diagnosis not present

## 2023-05-19 DIAGNOSIS — Z7984 Long term (current) use of oral hypoglycemic drugs: Secondary | ICD-10-CM | POA: Diagnosis not present

## 2023-05-19 DIAGNOSIS — L84 Corns and callosities: Secondary | ICD-10-CM

## 2023-05-19 DIAGNOSIS — E039 Hypothyroidism, unspecified: Secondary | ICD-10-CM | POA: Diagnosis not present

## 2023-05-19 DIAGNOSIS — M778 Other enthesopathies, not elsewhere classified: Secondary | ICD-10-CM | POA: Diagnosis not present

## 2023-05-19 DIAGNOSIS — E119 Type 2 diabetes mellitus without complications: Secondary | ICD-10-CM | POA: Diagnosis not present

## 2023-05-19 DIAGNOSIS — E785 Hyperlipidemia, unspecified: Secondary | ICD-10-CM | POA: Diagnosis not present

## 2023-05-19 DIAGNOSIS — E1159 Type 2 diabetes mellitus with other circulatory complications: Secondary | ICD-10-CM

## 2023-05-19 LAB — BAYER DCA HB A1C WAIVED: HB A1C (BAYER DCA - WAIVED): 7.1 % — ABNORMAL HIGH (ref 4.8–5.6)

## 2023-05-19 MED ORDER — LOSARTAN POTASSIUM 100 MG PO TABS: 100.0000 mg | ORAL_TABLET | Freq: Every day | ORAL | 3 refills | Status: DC

## 2023-05-19 MED ORDER — LEVOTHYROXINE SODIUM 75 MCG PO TABS: 75.0000 ug | ORAL_TABLET | Freq: Every day | ORAL | 3 refills | Status: DC

## 2023-05-19 MED ORDER — ATORVASTATIN CALCIUM 80 MG PO TABS: ORAL_TABLET | ORAL | 3 refills | Status: DC

## 2023-05-19 MED ORDER — SITAGLIPTIN PHOSPHATE 50 MG PO TABS: 50.0000 mg | ORAL_TABLET | Freq: Every day | ORAL | 3 refills | Status: DC

## 2023-05-19 NOTE — Patient Instructions (Signed)
Voltaren gel and heat to that right shoulder. Let me know if it gets bad enough you want to see the shoulder specialist for a shot.

## 2023-05-19 NOTE — Progress Notes (Signed)
Subjective: CC:DM PCP: [name], DO [id number] Maria Alexander is a 89 y.o. female presenting to clinic today for:  1. Type 2 Diabetes with hypertension, hyperlipidemia:  She is accompanied today's visit by her daughter.  Patient notes that she is compliant with medications but needs refills on [name].  Diabetes Health Maintenance Due  Topic Date Due   OPHTHALMOLOGY EXAM  06/02/2023   HEMOGLOBIN A1C  07/16/2023   FOOT EXAM  05/18/2024    Last A1c:  Lab Results  Component Value Date   HGBA1C 7.5 (H) 01/13/2023    ROS: No reports of chest pain, shortness of breath.  Has been having some right sided shoulder pain.  This occurred about 2 to 3 weeks after her flu shot.  She points to the anterior lateral deltoid as a source of pain and notes that she has limited range of motion in extension and flexion.  No preceding injury that she knows of.  Continues to have chronic low back pain.  Uses Tylenol for this.  No falls reported.  Continues ambulating with cane and/or assistance.  Needs a new handicap placard  ROS: Per HPI  Allergies  Allergen Reactions   Shellfish Allergy Hives   Past Medical History:  Diagnosis Date   Diabetes (HCC)    DVT (deep venous thrombosis) (HCC)    H/O blood clots 2000   High cholesterol    Hypothyroid    Stroke [facility])     Current Outpatient Medications:    ALPRAZolam (XANAX) 1 MG tablet, Take 0.5-1 tablets (0.5-1 mg total) by mouth at bedtime as needed for anxiety., Disp: 30 tablet, Rfl: 3   atorvastatin (LIPITOR) 80 MG tablet, TAKE (1/2) TABLET DAILY., Disp: 45 tablet, Rfl: 3   azelastine (ASTELIN) 0.1 % nasal spray, Place 1 spray into both nostrils 2 (two) times daily., Disp: 30 mL, Rfl: 12   cetirizine (ZYRTEC) 10 MG tablet, Take 10 mg by mouth daily., Disp: , Rfl:    levothyroxine (SYNTHROID) 75 MCG tablet, Take 1 tablet (75 mcg total) by mouth daily before breakfast., Disp: 100 tablet, Rfl: 3   Multiple Vitamin (MULTIVITAMIN) tablet, Take 1  tablet by mouth daily. Centrum Women's 50+, Disp: , Rfl:    sitaGLIPtin ([name]) 50 MG tablet, TAKE ONE TABLET BY MOUTH DAILY, Disp: 90 tablet, Rfl: 0   losartan (COZAAR) 100 MG tablet, Take 1 tablet (100 mg total) by mouth daily., Disp: 100 tablet, Rfl: 3 Social History   Socioeconomic History   Marital status: Widowed    Spouse name: Not on file   Number of children: 3   Years of education: 10   Highest education level: 10th grade  Occupational History   Occupation: retired  Tobacco Use   Smoking status: Former    Current packs/day: 0.00    Types: Cigarettes    Quit date: 02/05/1995    Years since quitting: 28.3    Passive exposure: Past   Smokeless tobacco: Never  Vaping Use   Vaping status: Never Used  Substance and Sexual Activity   Alcohol use: No    Alcohol/week: 0.0 standard drinks of alcohol   Drug use: No   Sexual activity: Not Currently    Birth control/protection: None  Other Topics Concern   Not on file  Social History Narrative   Lives alone- has a basement, but doesn't use it   Daughter lives nearby   Social Determinants of Health   Financial Resource Strain: Low Risk  (05/05/2023)   Overall  Financial Resource Strain (CARDIA)    Difficulty of Paying Living Expenses: Not hard at all  Food Insecurity: No Food Insecurity (05/05/2023)   Hunger Vital Sign    Worried About Running Out of Food in the Last Year: Never true    Ran Out of Food in the Last Year: Never true  Transportation Needs: No Transportation Needs (05/05/2023)   PRAPARE - Administrator, Civil Service (Medical): No    Lack of Transportation (Non-Medical): No  Physical Activity: Inactive (05/05/2023)   Exercise Vital Sign    Days of Exercise per Week: 0 days    Minutes of Exercise per Session: 0 min  Stress: No Stress Concern Present (05/05/2023)   [employer] of Occupational Health - Occupational Stress Questionnaire    Feeling of Stress : Not at all  Social Connections:  Socially Isolated (05/05/2023)   Social Connection and Isolation Panel [NHANES]    Frequency of Communication with Friends and Family: More than three times a week    Frequency of Social Gatherings with Friends and Family: More than three times a week    Attends Religious Services: Never    Database administrator or Organizations: No    Attends Banker Meetings: Never    Marital Status: Widowed  Intimate Partner Violence: Not At Risk (05/05/2023)   Humiliation, Afraid, Rape, and Kick questionnaire    Fear of Current or Ex-Partner: No    Emotionally Abused: No    Physically Abused: No    Sexually Abused: No   Family History  Problem Relation Age of Onset   Cancer Mother    Heart disease Father        before age 32   Heart attack Father    Diabetes Sister    Diabetes Brother     Objective: Office vital signs reviewed. BP 139/77   Pulse 72   Temp 98.7 F (37.1 C)   Ht 5\' 4"  (1.626 m)   Wt 121 lb (54.9 kg)   SpO2 95%   BMI 20.77 kg/m   Physical Examination:  General: Awake, alert, well nourished, No acute distress HEENT: sclera white, MMM Cardio: regular rate and rhythm, S1S2 heard, no murmurs appreciated Pulm: clear to auscultation bilaterally, no wheezes, rhonchi or rales; normal work of breathing on room air MSK: Ambulates with minimal assistance.  Hunched station.  Right shoulder with no significant tenderness palpation.  Painful arc sign present.  Limited flexion, abduction of right shoulder.  Diabetic Foot Exam - Simple   Simple Foot Form Diabetic Foot exam was performed with the following findings: Yes 05/19/2023  3:59 PM  Visual Inspection See comments: Yes Sensation Testing Intact to touch and monofilament testing bilaterally: Yes Pulse Check Posterior Tibialis and Dorsalis pulse intact bilaterally: Yes Comments She has a preulcerative callus noted along the base of the first MTP on the left medially.  She has hallucis valgus deformation  bilaterally      Assessment/ Plan: 89 y.o. female   Diabetes mellitus treated with oral medication (HCC) - Plan: Bayer DCA Hb A1c Waived, sitaGLIPtin ([name]) 50 MG tablet  Hypertension associated with diabetes (HCC) - Plan: Basic Metabolic Panel, losartan (COZAAR) 100 MG tablet  Hyperlipidemia associated with type 2 diabetes mellitus (HCC) - Plan: atorvastatin (LIPITOR) 80 MG tablet  Stage 3a chronic kidney disease (CKD) (HCC) - Plan: Basic Metabolic Panel, CBC, VITAMIN D 25 Hydroxy (Vit-D Deficiency, Fractures)  Acquired hypothyroidism - Plan: levothyroxine (SYNTHROID) 75 MCG tablet  Shoulder  tendinitis, right  Foot callus  [name] renewed.  Check A1c.  Check renal function.  Continue ARB.  Blood pressure controlled for age.  Continue statin.  Not yet due for fasting lipid.  Check CBC, vitamin D given CKD 3A.  Not due for thyroid levels as these were normal at last visit.  Continue Synthroid.  Suspect shoulder tendinitis on the right.  Discussed utilization of heat, ice, Voltaren gel to the affected area.  If needed, will plan for referral to orthopedics for consideration of further evaluation under ultrasound and/or shoulder injection.  Additionally, during her diabetic foot exam a foot callus was noted along the left medial aspect of the foot.  I encouraged her to avoid self cutting of that area.  Would certainly recommend against risking any type of infection.  Though her sensation was totally intact today.  [name], DO [facility] [phone]

## 2023-05-20 LAB — BASIC METABOLIC PANEL
BUN/Creatinine Ratio: 22 (ref 12–28)
BUN: 20 mg/dL (ref 10–36)
CO2: 23 mmol/L (ref 20–29)
Calcium: 9.3 mg/dL (ref 8.7–10.3)
Chloride: 102 mmol/L (ref 96–106)
Creatinine, Ser: 0.92 mg/dL (ref 0.57–1.00)
Glucose: 133 mg/dL — ABNORMAL HIGH (ref 70–99)
Potassium: 4.7 mmol/L (ref 3.5–5.2)
Sodium: 138 mmol/L (ref 134–144)
eGFR: 57 mL/min/{1.73_m2} — ABNORMAL LOW (ref >59)

## 2023-05-20 LAB — CBC
Hematocrit: 34.1 % (ref 34.0–46.6)
Hemoglobin: 11.2 g/dL (ref 11.1–15.9)
MCH: 29.9 pg (ref 26.6–33.0)
MCHC: 32.8 g/dL (ref 31.5–35.7)
MCV: 91 fL (ref 79–97)
Platelets: 135 10*3/uL — ABNORMAL LOW (ref 150–450)
RBC: 3.74 x10E6/uL — ABNORMAL LOW (ref 3.77–5.28)
RDW: 14.2 % (ref 11.7–15.4)
WBC: 7.2 10*3/uL (ref 3.4–10.8)

## 2023-05-20 LAB — VITAMIN D 25 HYDROXY (VIT D DEFICIENCY, FRACTURES): Vit D, 25-Hydroxy: 45.2 ng/mL (ref 30.0–100.0)

## 2023-07-07 ENCOUNTER — Other Ambulatory Visit: Payer: Medicare Other

## 2023-07-14 ENCOUNTER — Other Ambulatory Visit: Payer: Self-pay | Admitting: *Deleted

## 2023-07-14 NOTE — Patient Outreach (Signed)
 Care Management   Visit Note  07/14/2023 Name: Maria Alexander MRN: [id number] DOB: December 02, 1923  Subjective: Maria Alexander is a 89 y.o. year old female who is a primary care patient of Eliodoro Guerin, DO. The Care Management team was consulted for assistance.      Engaged with patient spoke with patient by telephone.    Goals Addressed             This Visit's Progress    COMPLETED: RNCM Care Management (DIABETES) EXPECTED OUTCOME:  MONITOR, SELF-MANAGE AND REDUCE SYMPTOMS OF DIABETES       Current Barriers:  Knowledge Deficits related to Diabetes management Chronic Disease Management support and education needs related to Diabetes, diet  Planned Interventions: Lab Results  Component Value Date   HGBA1C 7.1 (H) 05/19/2023     Provided education to patient about basic DM disease process. Reviewed medications with patient and discussed importance of medication adherence;  Does not check her blood sugars.       Counseled on importance of regular laboratory monitoring as prescribed; Scheduled for labs on 11/20.       Discussed plans with patient for ongoing care management follow up and provided patient with direct contact information for care management team;      Provided patient with written educational materials related to hypo and hyperglycemia and importance of correct treatment;       call provider for findings outside established parameters;       Review of patient status, including review of consultants reports, relevant laboratory and other test results, and medications completed;       Screening for signs and symptoms of depression related to chronic disease state;        Assessed social determinant of health barriers;        Reinforced importance of limiting/ avoiding concentrated sweets. Patient reports she had not been following a special diet, sometimes eats sweets  Patient reports she loves sweets and this has been an ongoing issue for her. Reinforced examples of  nutritious food choices and following carbohydrate modified diet/ plate method Reinforced safety precautions  Symptom Management: Take medications as prescribed   Attend all scheduled provider appointments Call pharmacy for medication refills 3-7 days in advance of running out of medications Attend church or other social activities Perform all self care activities independently  check feet daily for cuts, sores or redness trim toenails straight across drink 6 to 8 glasses of water  each day fill half of plate with vegetables limit fast food meals to no more than 1 per week read food labels for fat, fiber, carbohydrates and portion size set a realistic goal Please be careful when you walk, do not get in a hurry, keep pathways clear, ask for assistance as needed Follow a carbohydrate modified diet- too much bread, pasta, potatoes, rice at one meal can elevate your blood sugar Please avoid concentrated sweets (cakes, cookies, pies etc)  Follow Up Plan: Telephone follow up appointment with care management team member scheduled for:    07-07-2023 at 10:30 am       COMPLETED: RNCM Care Management (HYPERTENSION) EXPECTED OUTCOME: MONITOR, SELF-MANAGE AND REDUCE SYMPTOMS OF HYPERTENSION       Current Barriers:  Knowledge Deficits related to Hypertension Chronic Disease Management support and education needs related to Hypertension, diet Lives alone, hard of hearing   Planned Interventions: Evaluation of current treatment plan related to hypertension self management and patient's adherence to plan as established by provider. Has  blood pressure cuff, does not regularly check blood pressure. Reviewed medications with patient and discussed importance of compliance. The patient is complaint with all medications. Counseled on the importance of exercise goals with target of 150 minutes per week. Unable to exercise as she previously did due to back pain. Advised patient, providing education and  rationale, to monitor blood pressure daily and record, calling PCP for findings outside established parameters. Does not check BP or keep log. Advised patient to discuss any issues with blood pressure, medications with provider; Discussed complications of poorly controlled blood pressure such as heart disease, stroke, circulatory complications, vision complications, kidney impairment, sexual dysfunction;  Reviewed low sodium diet and importance of reading labels Reviewed importance of drinking adequate fluids, preferably water   Symptom Management: Take medications as prescribed   Attend all scheduled provider appointments Call pharmacy for medication refills 3-7 days in advance of running out of medications Attend church or other social activities Perform all self care activities independently  check blood pressure weekly choose a place to take my blood pressure (home, clinic or office, retail store) write blood pressure results in a log or diary keep a blood pressure log take blood pressure log to all doctor appointments call doctor for signs and symptoms of high blood pressure develop an action plan for high blood pressure keep all doctor appointments take medications for blood pressure exactly as prescribed report new symptoms to your doctor eat more whole grains, fruits and vegetables, lean meats and healthy fats Follow low sodium diet- limit fast food Read food labels for sodium content Continue drinking adequate fluids, preferably water    Follow Up Plan: Telephone follow up appointment with care management team member scheduled for:   07-07-2023 at 10:30 am       RNCM Care Management Expected Outcomes: Monitor, Self-Managed and Reduce Symptoms of: DM, HTN, Pain       Current Barriers:  Knowledge Deficits related to plan of care for management of HTN, DMII, and Pain  Chronic Disease Management support and education needs related to HTN, DMII, and Pain   RNCM Clinical Goal(s):   Patient will verbalize basic understanding of  HTN and DMII disease process and self health management plan as evidenced by verbal explanation, recognizing symptoms, lifestyle modification take all medications exactly as prescribed and will call provider for medication related questions as evidenced by compliance with all medications attend all scheduled medical appointments: with primary care provider and specialists as evidenced by keeping all scheduled appointments demonstrate Improved and Ongoing adherence to prescribed treatment plan for HTN and DMII as evidenced by consistent medication compliance, symptom monitoring, continued lifestyle modifications continue to work with RN Care Manager to address care management and care coordination needs related to  HTN and DMII as evidenced by adherence to CM Team Scheduled appointments through collaboration with RN Care manager, provider, and care team.   Interventions: Evaluation of current treatment plan related to  self management and patient's adherence to plan as established by provider   Diabetes Interventions:  (Status:  Goal on track:  Yes.) Long Term Goal Assessed patient's understanding of A1c goal: <8% Provided education to patient about basic DM disease process. Per patient, she does not check her blood sugar and states he has not been recommended. Reviewed medications with patient and discussed importance of medication adherence Counseled on importance of regular laboratory monitoring as prescribed Discussed plans with patient for ongoing care management follow up and provided patient with direct contact information for care management team  Provided patient with written educational materials related to hypo and hyperglycemia and importance of correct treatment. No reports of hypoglycemia or hyperglycemia Reviewed scheduled/upcoming provider appointments including: AW[facility] 08-09-23 Review of patient status, including review of consultants  reports, relevant laboratory and other test results, and medications completed Screening for signs and symptoms of depression related to chronic disease state  Assessed social determinant of health barriers Lab Results  Component Value Date   HGBA1C 7.1 (H) 05/19/2023    Hypertension Interventions:  (Status:  Goal on track:  Yes.) Long Term Goal Last practice recorded BP readings:  BP Readings from Last 3 Encounters:  05/19/23 139/77  01/13/23 109/61  10/22/22 (!) 148/55   Most recent eGFR/CrCl:  Lab Results  Component Value Date   EGFR 57 (L) 05/19/2023    No components found for: "CRCL"  Evaluation of current treatment plan related to hypertension self management and patient's adherence to plan as established by provider. Does not check blood pressure at home and denies any dizziness, chest pain, swelling.  Provided education to patient re: stroke prevention, s/s of heart attack and stroke Reviewed medications with patient and discussed importance of compliance. Reports compliance with all medications Counseled on adverse effects of illicit drug and excessive alcohol  use in patients with high blood pressure  Counseled on the importance of exercise goals with target of 150 minutes per week. States she use to walk around her home several times per day but has since stopped walking around for exercise Discussed plans with patient for ongoing care management follow up and provided patient with direct contact information for care management team Advised patient, providing education and rationale, to monitor blood pressure daily and record, calling PCP for findings outside established parameters Reviewed scheduled/upcoming provider appointments including: AW[facility] 08-09-23 Advised patient to discuss any new changes with provider Provided education on prescribed diet ADA/Heart Healthy. Does not follow special diet. Discussed complications of poorly controlled blood pressure such as heart disease,  stroke, circulatory complications, vision complications, kidney impairment, sexual dysfunction Screening for signs and symptoms of depression related to chronic disease state  Assessed social determinant of health barriers Reports hearing loss has increased since Christmas and she is supposed to be following up with a audiologist but this has not been scheduled as of yet. She reports strong family support with no needs at this time.  Pain Interventions:  (Status:  New goal. and Goal on track:  Yes.) Long Term Goal Pain assessment performed. Reports right shoulder pain and continues to take Tylenol  as needed. Medications reviewed Reviewed provider established plan for pain management Discussed importance of adherence to all scheduled medical appointments Counseled on the importance of reporting any/all new or changed pain symptoms or management strategies to pain management provider Advised patient to report to care team affect of pain on daily activities Discussed use of relaxation techniques and/or diversional activities to assist with pain reduction (distraction, imagery, relaxation, massage, acupressure, TENS, heat, and cold application Reviewed with patient prescribed pharmacological and nonpharmacological pain relief strategies Advised patient to discuss any new changes or increase in pain with provider Screening for signs and symptoms of depression related to chronic disease state  Assessed social determinant of health barriers  Patient Goals/Self-Care Activities: Take all medications as prescribed Attend all scheduled provider appointments Call pharmacy for medication refills 3-7 days in advance of running out of medications Attend church or other social activities Perform all self care activities independently  Perform IADL's (shopping, preparing meals, housekeeping, managing finances) independently  Call provider office for new concerns or questions  schedule appointment with eye  doctor check feet daily for cuts, sores or redness take the blood sugar log to all doctor visits check blood pressure daily keep a blood pressure log  Follow Up Plan:  Telephone follow up appointment with care management team member scheduled for:  09-08-2023 at 10:00 am             Consent to Services:  Patient was given information about care management services, agreed to services, and gave verbal consent to participate.   Plan: Telephone follow up appointment with care management team member scheduled for:09-08-2023 at 10:00 am  Grandville Lax, BSN RN RN Care Manager  [facility]  Ambulatory Care Management  Direct Number: [phone]

## 2023-07-14 NOTE — Patient Instructions (Signed)
 Visit Information  Thank you for taking time to visit with me today. Please don't hesitate to contact me if I can be of assistance to you before our next scheduled telephone appointment.  Following are the goals we discussed today:   Goals Addressed             This Visit's Progress    COMPLETED: RNCM Care Management (DIABETES) EXPECTED OUTCOME:  MONITOR, SELF-MANAGE AND REDUCE SYMPTOMS OF DIABETES       Current Barriers:  Knowledge Deficits related to Diabetes management Chronic Disease Management support and education needs related to Diabetes, diet  Planned Interventions: Lab Results  Component Value Date   HGBA1C 7.1 (H) 05/19/2023     Provided education to patient about basic DM disease process. Reviewed medications with patient and discussed importance of medication adherence;  Does not check her blood sugars.       Counseled on importance of regular laboratory monitoring as prescribed; Scheduled for labs on 11/20.       Discussed plans with patient for ongoing care management follow up and provided patient with direct contact information for care management team;      Provided patient with written educational materials related to hypo and hyperglycemia and importance of correct treatment;       call provider for findings outside established parameters;       Review of patient status, including review of consultants reports, relevant laboratory and other test results, and medications completed;       Screening for signs and symptoms of depression related to chronic disease state;        Assessed social determinant of health barriers;        Reinforced importance of limiting/ avoiding concentrated sweets. Patient reports she had not been following a special diet, sometimes eats sweets  Patient reports she loves sweets and this has been an ongoing issue for her. Reinforced examples of nutritious food choices and following carbohydrate modified diet/ plate method Reinforced safety  precautions  Symptom Management: Take medications as prescribed   Attend all scheduled provider appointments Call pharmacy for medication refills 3-7 days in advance of running out of medications Attend church or other social activities Perform all self care activities independently  check feet daily for cuts, sores or redness trim toenails straight across drink 6 to 8 glasses of water  each day fill half of plate with vegetables limit fast food meals to no more than 1 per week read food labels for fat, fiber, carbohydrates and portion size set a realistic goal Please be careful when you walk, do not get in a hurry, keep pathways clear, ask for assistance as needed Follow a carbohydrate modified diet- too much bread, pasta, potatoes, rice at one meal can elevate your blood sugar Please avoid concentrated sweets (cakes, cookies, pies etc)  Follow Up Plan: Telephone follow up appointment with care management team member scheduled for:    07-07-2023 at 10:30 am       COMPLETED: RNCM Care Management (HYPERTENSION) EXPECTED OUTCOME: MONITOR, SELF-MANAGE AND REDUCE SYMPTOMS OF HYPERTENSION       Current Barriers:  Knowledge Deficits related to Hypertension Chronic Disease Management support and education needs related to Hypertension, diet Lives alone, hard of hearing   Planned Interventions: Evaluation of current treatment plan related to hypertension self management and patient's adherence to plan as established by provider. Has blood pressure cuff, does not regularly check blood pressure. Reviewed medications with patient and discussed importance of compliance. The patient  is complaint with all medications. Counseled on the importance of exercise goals with target of 150 minutes per week. Unable to exercise as she previously did due to back pain. Advised patient, providing education and rationale, to monitor blood pressure daily and record, calling PCP for findings outside established  parameters. Does not check BP or keep log. Advised patient to discuss any issues with blood pressure, medications with provider; Discussed complications of poorly controlled blood pressure such as heart disease, stroke, circulatory complications, vision complications, kidney impairment, sexual dysfunction;  Reviewed low sodium diet and importance of reading labels Reviewed importance of drinking adequate fluids, preferably water   Symptom Management: Take medications as prescribed   Attend all scheduled provider appointments Call pharmacy for medication refills 3-7 days in advance of running out of medications Attend church or other social activities Perform all self care activities independently  check blood pressure weekly choose a place to take my blood pressure (home, clinic or office, retail store) write blood pressure results in a log or diary keep a blood pressure log take blood pressure log to all doctor appointments call doctor for signs and symptoms of high blood pressure develop an action plan for high blood pressure keep all doctor appointments take medications for blood pressure exactly as prescribed report new symptoms to your doctor eat more whole grains, fruits and vegetables, lean meats and healthy fats Follow low sodium diet- limit fast food Read food labels for sodium content Continue drinking adequate fluids, preferably water    Follow Up Plan: Telephone follow up appointment with care management team member scheduled for:   07-07-2023 at 10:30 am       RNCM Care Management Expected Outcomes: Monitor, Self-Managed and Reduce Symptoms of: DM, HTN, Pain       Current Barriers:  Knowledge Deficits related to plan of care for management of HTN, DMII, and Pain  Chronic Disease Management support and education needs related to HTN, DMII, and Pain   RNCM Clinical Goal(s):  Patient will verbalize basic understanding of  HTN and DMII disease process and self health  management plan as evidenced by verbal explanation, recognizing symptoms, lifestyle modification take all medications exactly as prescribed and will call provider for medication related questions as evidenced by compliance with all medications attend all scheduled medical appointments: with primary care provider and specialists as evidenced by keeping all scheduled appointments demonstrate Improved and Ongoing adherence to prescribed treatment plan for HTN and DMII as evidenced by consistent medication compliance, symptom monitoring, continued lifestyle modifications continue to work with RN Care Manager to address care management and care coordination needs related to  HTN and DMII as evidenced by adherence to CM Team Scheduled appointments through collaboration with RN Care manager, provider, and care team.   Interventions: Evaluation of current treatment plan related to  self management and patient's adherence to plan as established by provider   Diabetes Interventions:  (Status:  Goal on track:  Yes.) Long Term Goal Assessed patient's understanding of A1c goal: <8% Provided education to patient about basic DM disease process. Per patient, she does not check her blood sugar and states he has not been recommended. Reviewed medications with patient and discussed importance of medication adherence Counseled on importance of regular laboratory monitoring as prescribed Discussed plans with patient for ongoing care management follow up and provided patient with direct contact information for care management team Provided patient with written educational materials related to hypo and hyperglycemia and importance of correct treatment. No reports of hypoglycemia  or hyperglycemia Reviewed scheduled/upcoming provider appointments including: AWV 08-09-23 Review of patient status, including review of consultants reports, relevant laboratory and other test results, and medications completed Screening for  signs and symptoms of depression related to chronic disease state  Assessed social determinant of health barriers Lab Results  Component Value Date   HGBA1C 7.1 (H) 05/19/2023    Hypertension Interventions:  (Status:  Goal on track:  Yes.) Long Term Goal Last practice recorded BP readings:  BP Readings from Last 3 Encounters:  05/19/23 139/77  01/13/23 109/61  10/22/22 (!) 148/55   Most recent eGFR/CrCl:  Lab Results  Component Value Date   EGFR 57 (L) 05/19/2023    No components found for: "CRCL"  Evaluation of current treatment plan related to hypertension self management and patient's adherence to plan as established by provider. Does not check blood pressure at home and denies any dizziness, chest pain, swelling.  Provided education to patient re: stroke prevention, s/s of heart attack and stroke Reviewed medications with patient and discussed importance of compliance. Reports compliance with all medications Counseled on adverse effects of illicit drug and excessive alcohol  use in patients with high blood pressure  Counseled on the importance of exercise goals with target of 150 minutes per week. States she use to walk around her home several times per day but has since stopped walking around for exercise Discussed plans with patient for ongoing care management follow up and provided patient with direct contact information for care management team Advised patient, providing education and rationale, to monitor blood pressure daily and record, calling PCP for findings outside established parameters Reviewed scheduled/upcoming provider appointments including: AWV 08-09-23 Advised patient to discuss any new changes with provider Provided education on prescribed diet ADA/Heart Healthy. Does not follow special diet. Discussed complications of poorly controlled blood pressure such as heart disease, stroke, circulatory complications, vision complications, kidney impairment, sexual  dysfunction Screening for signs and symptoms of depression related to chronic disease state  Assessed social determinant of health barriers Reports hearing loss has increased since Christmas and she is supposed to be following up with a audiologist but this has not been scheduled as of yet. She reports strong family support with no needs at this time.  Pain Interventions:  (Status:  New goal. and Goal on track:  Yes.) Long Term Goal Pain assessment performed. Reports right shoulder pain and continues to take Tylenol  as needed. Medications reviewed Reviewed provider established plan for pain management Discussed importance of adherence to all scheduled medical appointments Counseled on the importance of reporting any/all new or changed pain symptoms or management strategies to pain management provider Advised patient to report to care team affect of pain on daily activities Discussed use of relaxation techniques and/or diversional activities to assist with pain reduction (distraction, imagery, relaxation, massage, acupressure, TENS, heat, and cold application Reviewed with patient prescribed pharmacological and nonpharmacological pain relief strategies Advised patient to discuss any new changes or increase in pain with provider Screening for signs and symptoms of depression related to chronic disease state  Assessed social determinant of health barriers  Patient Goals/Self-Care Activities: Take all medications as prescribed Attend all scheduled provider appointments Call pharmacy for medication refills 3-7 days in advance of running out of medications Attend church or other social activities Perform all self care activities independently  Perform IADL's (shopping, preparing meals, housekeeping, managing finances) independently Call provider office for new concerns or questions  schedule appointment with eye doctor check feet daily for cuts, sores  or redness take the blood sugar log to all  doctor visits check blood pressure daily keep a blood pressure log  Follow Up Plan:  Telephone follow up appointment with care management team member scheduled for:  09-08-2023 at 10:00 am           Our next appointment is by telephone on 09-08-2023 at 10:00 am  Please call the care guide team at [phone] if you need to cancel or reschedule your appointment.   If you are experiencing a Mental Health or Behavioral Health Crisis or need someone to talk to, please call the Suicide and Crisis Lifeline: 988 call the [place]  National Suicide Prevention Lifeline: (908)378-1454 or TTY: 520-040-3035 TTY 386 388 6936) to talk to a trained counselor call 1-800-273-TALK (toll free, 24 hour hotline) call 911   Patient verbalizes understanding of instructions and care plan provided today and agrees to view in MyChart. Active MyChart status and patient understanding of how to access instructions and care plan via MyChart confirmed with patient.     Telephone follow up appointment with care management team member scheduled for:09-08-2023 at 10:00 am  Grandville Lax, BSN RN RN Care Manager  [facility]  Ambulatory Care Management  Direct Number: [phone]

## 2023-09-08 ENCOUNTER — Other Ambulatory Visit: Payer: Self-pay | Admitting: *Deleted

## 2023-09-08 ENCOUNTER — Telehealth: Payer: Self-pay | Admitting: *Deleted

## 2023-09-08 NOTE — Patient Outreach (Signed)
  Care Management   Follow Up Note   09/08/2023 Name: ONISHA CEDENO MRN: [id number] DOB: 06/12/1924   Referred by: [name], DO Reason for referral : Care Management (RNCM: ATTEMPTED Follow Up For Chronic Disease Management & Care Coordination Needs )   An unsuccessful telephone outreach was attempted today. The patient was referred to the case management team for assistance with care management and care coordination.   Follow Up Plan: The care management team will reach out to the patient again over the next 30 days.   Larey Brick, BSN RN [facility], [facility] RN Care Manager Direct Dial: [phone]  Fax: [phone]

## 2023-10-04 ENCOUNTER — Other Ambulatory Visit: Payer: Self-pay | Admitting: Family Medicine

## 2023-10-04 DIAGNOSIS — F419 Anxiety disorder, unspecified: Secondary | ICD-10-CM

## 2023-10-18 ENCOUNTER — Telehealth: Payer: Self-pay | Admitting: *Deleted

## 2023-10-18 NOTE — Progress Notes (Signed)
 Complex Care Management Care Guide Note  10/18/2023 Name: Maria Alexander MRN: [id number] DOB: 1923-08-28  Maria Alexander is a 89 y.o. year old female who is a primary care patient of Eliodoro Guerin, DO and is actively engaged with the care management team. I reached out to Remus Carmine by phone today to assist with re-scheduling  with the RN Case Manager.  Follow up plan: Successful telephone outreach attempt made. Patient declines rescheduling with [facility] and working with CCM Team. No further outreach attempts will be made at this time. We have been unable to contact the patient to reschedule for complex care management services.   Barnie Bora  [facility]  [facility]Based [facility], [facility] Guide  Direct Dial: [phone]  Fax [phone]

## 2023-10-18 NOTE — Progress Notes (Signed)
 Complex Care Management Care Guide Note  10/18/2023 Name: Maria Alexander MRN: [id number] DOB: December 02, 1923  Maria Alexander is a 89 y.o. year old female who is a primary care patient of Eliodoro Guerin, DO and is actively engaged with the care management team. I reached out to Remus Carmine by phone today to assist with re-scheduling  with the RN Case Manager.  Follow up plan: Unsuccessful telephone outreach attempt made.   Barnie Bora  [facility]  Value-Based Care Institute, [facility] Guide  Direct Dial: [phone]  Fax [phone]

## 2023-10-19 ENCOUNTER — Encounter: Payer: Self-pay | Admitting: *Deleted

## 2023-10-19 NOTE — Patient Outreach (Signed)
  Care Management   Visit Note  10/19/2023 Name: Maria Alexander MRN: [id number] DOB: 12-27-1923  Subjective: Maria Alexander is a 89 y.o. year old female who is a primary care patient of Eliodoro Guerin, DO. The Care Management team was consulted for assistance.      Complex Care Management case closure per patient request. Care Plan now completed and closed.  Grandville Lax, BSN RN [facility], [facility] RN Care Manager Direct Dial: [phone]  Fax: [phone]

## 2023-11-23 ENCOUNTER — Encounter: Payer: Self-pay | Admitting: Family Medicine

## 2023-11-23 ENCOUNTER — Ambulatory Visit (INDEPENDENT_AMBULATORY_CARE_PROVIDER_SITE_OTHER): Payer: Medicare Other | Admitting: Family Medicine

## 2023-11-23 VITALS — BP 142/83 | HR 72 | Temp 98.3°F | Ht 64.0 in | Wt 121.0 lb

## 2023-11-23 DIAGNOSIS — E1159 Type 2 diabetes mellitus with other circulatory complications: Secondary | ICD-10-CM

## 2023-11-23 DIAGNOSIS — Z0001 Encounter for general adult medical examination with abnormal findings: Secondary | ICD-10-CM

## 2023-11-23 DIAGNOSIS — J3489 Other specified disorders of nose and nasal sinuses: Secondary | ICD-10-CM

## 2023-11-23 DIAGNOSIS — D696 Thrombocytopenia, unspecified: Secondary | ICD-10-CM

## 2023-11-23 DIAGNOSIS — E119 Type 2 diabetes mellitus without complications: Secondary | ICD-10-CM | POA: Diagnosis not present

## 2023-11-23 DIAGNOSIS — F419 Anxiety disorder, unspecified: Secondary | ICD-10-CM

## 2023-11-23 DIAGNOSIS — I152 Hypertension secondary to endocrine disorders: Secondary | ICD-10-CM | POA: Diagnosis not present

## 2023-11-23 DIAGNOSIS — E039 Hypothyroidism, unspecified: Secondary | ICD-10-CM

## 2023-11-23 DIAGNOSIS — E1169 Type 2 diabetes mellitus with other specified complication: Secondary | ICD-10-CM

## 2023-11-23 DIAGNOSIS — Z0000 Encounter for general adult medical examination without abnormal findings: Secondary | ICD-10-CM

## 2023-11-23 DIAGNOSIS — Z79899 Other long term (current) drug therapy: Secondary | ICD-10-CM

## 2023-11-23 DIAGNOSIS — E785 Hyperlipidemia, unspecified: Secondary | ICD-10-CM | POA: Diagnosis not present

## 2023-11-23 DIAGNOSIS — N1831 Chronic kidney disease, stage 3a: Secondary | ICD-10-CM

## 2023-11-23 DIAGNOSIS — Z7984 Long term (current) use of oral hypoglycemic drugs: Secondary | ICD-10-CM

## 2023-11-23 LAB — LIPID PANEL

## 2023-11-23 LAB — BAYER DCA HB A1C WAIVED: HB A1C (BAYER DCA - WAIVED): 7.1 % — ABNORMAL HIGH (ref 4.8–5.6)

## 2023-11-23 MED ORDER — ATORVASTATIN CALCIUM 80 MG PO TABS: ORAL_TABLET | ORAL | 3 refills | Status: DC

## 2023-11-23 MED ORDER — SITAGLIPTIN PHOSPHATE 50 MG PO TABS
50.0000 mg | ORAL_TABLET | Freq: Every day | ORAL | 3 refills | Status: DC
Start: 2023-11-23 — End: 2024-05-22

## 2023-11-23 MED ORDER — AZELASTINE HCL 0.1 % NA SOLN
1.0000 | Freq: Two times a day (BID) | NASAL | 12 refills | Status: AC
Start: 2023-11-23 — End: ?

## 2023-11-23 MED ORDER — LEVOTHYROXINE SODIUM 75 MCG PO TABS
75.0000 ug | ORAL_TABLET | Freq: Every day | ORAL | 3 refills | Status: DC
Start: 2023-11-23 — End: 2024-05-22

## 2023-11-23 MED ORDER — LOSARTAN POTASSIUM 100 MG PO TABS
100.0000 mg | ORAL_TABLET | Freq: Every day | ORAL | 3 refills | Status: DC
Start: 2023-11-23 — End: 2024-05-22

## 2023-11-23 MED ORDER — ALPRAZOLAM 1 MG PO TABS: 0.5000 mg | ORAL_TABLET | Freq: Every evening | ORAL | 5 refills | Status: DC | PRN

## 2023-11-23 NOTE — Progress Notes (Signed)
 Maria Alexander is a 89 y.o. female who is accompanied today's visit by her daughter.  She presents to office today for annual physical exam examination.    Concerns today include: 1. Type 2 Diabetes with hypertension, hyperlipidemia:  Does not check blood sugars at home because she does not know how to use her meter.  Denies any symptoms of hyper or hypoglycemia.  She is compliant with her [name] , Lipitor and Cozaar .  Last eye exam: Has scheduled in November Last foot exam: Up-to-date Last A1c:  Lab Results  Component Value Date   HGBA1C 7.1 (H) 05/19/2023   Nephropathy screen indicated?:  N/A Last flu, zoster and/or pneumovax:  Immunization History  Administered Date(s) Administered   Fluad Quad(high Dose 65+) 04/01/2020   Fluad Trivalent(High Dose 65+) 04/19/2023   Influenza, High Dose Seasonal PF 04/04/2018   Influenza,inj,Quad PF,6+ Mos 03/31/2017   Influenza-Unspecified 04/07/2016, 04/04/2018, 02/28/2019, 03/27/2021, 04/13/2022   Moderna Covid-19 Fall Seasonal Vaccine 40yrs & older 07/06/2022, 04/08/2023   Moderna Sars-Covid-2 Vaccination 07/11/2019, 08/11/2019, 04/24/2020, 03/27/2021   Pneumococcal Conjugate-13 08/22/2014   Pneumococcal Polysaccharide-23 04/11/1997   Td 07/01/2022   Td (Adult),5 Lf Tetanus Toxid, Preservative Free 11/09/2007   Zoster Recombinant(Shingrix ) 03/22/2017, 06/02/2017    ROS: Denies dizziness, LOC, polyuria, polydipsia, unintended weight loss/gain, foot ulcerations, numbness or tingling in extremities, shortness of breath or chest pain.  2. Hypothyroidism Compliant with thyroid  medicine.  No reports of tremor, heart palpitations.  She does sometimes get lonely.  Her twin sister died and she lives at home by herself now.  She does get to spend time with family occasionally and also with neighbors.  She denies any falls.  She does report feeling anxious when she runs out of her medications.  She was using some of her sisters medicine in between  to get her to this appointment.  Occupation: Retired,  Substance use: None Health Maintenance Due  Topic Date Due   HEMOGLOBIN A1C  11/16/2023   Refills needed today: Alprazolam   Immunization History  Administered Date(s) Administered   Fluad Quad(high Dose 65+) 04/01/2020   Fluad Trivalent(High Dose 65+) 04/19/2023   Influenza, High Dose Seasonal PF 04/04/2018   Influenza,inj,Quad PF,6+ Mos 03/31/2017   Influenza-Unspecified 04/07/2016, 04/04/2018, 02/28/2019, 03/27/2021, 04/13/2022   Moderna Covid-19 Fall Seasonal Vaccine 71yrs & older 07/06/2022, 04/08/2023   Moderna Sars-Covid-2 Vaccination 07/11/2019, 08/11/2019, 04/24/2020, 03/27/2021   Pneumococcal Conjugate-13 08/22/2014   Pneumococcal Polysaccharide-23 04/11/1997   Td 07/01/2022   Td (Adult),5 Lf Tetanus Toxid, Preservative Free 11/09/2007   Zoster Recombinant(Shingrix ) 03/22/2017, 06/02/2017   Past Medical History:  Diagnosis Date   Diabetes (HCC)    DVT (deep venous thrombosis) (HCC)    H/O blood clots 2000   High cholesterol    Hypothyroid    Stroke [facility])    Social History   Socioeconomic History   Marital status: Widowed    Spouse name: Not on file   Number of children: 3   Years of education: 10   Highest education level: 10th grade  Occupational History   Occupation: retired  Tobacco Use   Smoking status: Former    Current packs/day: 0.00    Types: Cigarettes    Quit date: 02/05/1995    Years since quitting: 28.8    Passive exposure: Past   Smokeless tobacco: Never  Vaping Use   Vaping status: Never Used  Substance and Sexual Activity   Alcohol  use: No    Alcohol /week: 0.0 standard drinks of alcohol    Drug  use: No   Sexual activity: Not Currently    Birth control/protection: None  Other Topics Concern   Not on file  Social History Narrative   Lives alone- has a basement, but doesn't use it   Daughter lives nearby   Social Drivers of Health   Financial Resource Strain: Low Risk   (05/05/2023)   Overall Financial Resource Strain (CARDIA)    Difficulty of Paying Living Expenses: Not hard at all  Food Insecurity: No Food Insecurity (05/05/2023)   Hunger Vital Sign    Worried About Running Out of Food in the Last Year: Never true    Ran Out of Food in the Last Year: Never true  Transportation Needs: No Transportation Needs (05/05/2023)   PRAPARE - Administrator, Civil Service (Medical): No    Lack of Transportation (Non-Medical): No  Physical Activity: Inactive (05/05/2023)   Exercise Vital Sign    Days of Exercise per Week: 0 days    Minutes of Exercise per Session: 0 min  Stress: No Stress Concern Present (05/05/2023)   [employer] of Occupational Health - Occupational Stress Questionnaire    Feeling of Stress : Not at all  Social Connections: Socially Isolated (05/05/2023)   Social Connection and Isolation Panel [NHANES]    Frequency of Communication with Friends and Family: More than three times a week    Frequency of Social Gatherings with Friends and Family: More than three times a week    Attends Religious Services: Never    Database administrator or Organizations: No    Attends Banker Meetings: Never    Marital Status: Widowed  Intimate Partner Violence: Not At Risk (05/05/2023)   Humiliation, Afraid, Rape, and Kick questionnaire    Fear of Current or Ex-Partner: No    Emotionally Abused: No    Physically Abused: No    Sexually Abused: No   Past Surgical History:  Procedure Laterality Date   BIOPSY  04/23/2021   Procedure: BIOPSY;  Surgeon: Ruby Corporal, MD;  Location: [facility];  Service: Endoscopy;;  esophageal   ESOPHAGEAL DILATION N/A 03/19/2021   Procedure: ESOPHAGEAL DILATION;  Surgeon: Ruby Corporal, MD;  Location: [facility];  Service: Endoscopy;  Laterality: N/A;   ESOPHAGOGASTRODUODENOSCOPY (EGD) WITH PROPOFOL  N/A 03/19/2021   Procedure: ESOPHAGOGASTRODUODENOSCOPY (EGD) WITH PROPOFOL ;  Surgeon: Ruby Corporal, MD;  Location: [facility];  Service: Endoscopy;  Laterality: N/A;  9:35   ESOPHAGOGASTRODUODENOSCOPY (EGD) WITH PROPOFOL  N/A 04/23/2021   Procedure: ESOPHAGOGASTRODUODENOSCOPY (EGD) WITH PROPOFOL ;  Surgeon: Ruby Corporal, MD;  Location: [facility];  Service: Endoscopy;  Laterality: N/A;  8:30   NO PAST SURGERIES     Family History  Problem Relation Age of Onset   Cancer Mother    Heart disease Father        before age 72   Heart attack Father    Diabetes Sister    Diabetes Brother     Current Outpatient Medications:    ALPRAZolam  (XANAX ) 1 MG tablet, Take 0.5-1 tablets (0.5-1 mg total) by mouth at bedtime as needed for anxiety., Disp: 30 tablet, Rfl: 3   atorvastatin  (LIPITOR) 80 MG tablet, TAKE (1/2) TABLET DAILY., Disp: 45 tablet, Rfl: 3   azelastine  (ASTELIN ) 0.1 % nasal spray, Place 1 spray into both nostrils 2 (two) times daily., Disp: 30 mL, Rfl: 12   cetirizine (ZYRTEC) 10 MG tablet, Take 10 mg by mouth daily., Disp: , Rfl:  levothyroxine  (SYNTHROID ) 75 MCG tablet, Take 1 tablet (75 mcg total) by mouth daily before breakfast., Disp: 100 tablet, Rfl: 3   losartan  (COZAAR ) 100 MG tablet, Take 1 tablet (100 mg total) by mouth daily., Disp: 100 tablet, Rfl: 3   Multiple Vitamin (MULTIVITAMIN) tablet, Take 1 tablet by mouth daily. Centrum Women's 50+, Disp: , Rfl:    sitaGLIPtin  ([name] ) 50 MG tablet, Take 1 tablet (50 mg total) by mouth daily., Disp: 100 tablet, Rfl: 3  Allergies  Allergen Reactions   Shellfish Allergy Hives     ROS: Review of Systems Pertinent items noted in HPI and remainder of comprehensive ROS otherwise negative.    Physical exam BP (!) 142/83   Pulse 72   Temp 98.3 F (36.8 C)   Ht 5\' 4"  (1.626 m)   Wt 121 lb (54.9 kg)   SpO2 96%   BMI 20.77 kg/m  General appearance: alert, cooperative, appears stated age, and no distress Head: Normocephalic, without obvious abnormality, atraumatic Eyes: negative findings: lids and lashes  normal, conjunctivae and sclerae normal, corneas clear, and pupils equal, round, reactive to light and accomodation Ears: Cerumen present in bilateral external auditory canals Nose: Nares normal. Septum midline. Mucosa normal. No drainage or sinus tenderness. Throat: lips, mucosa, and tongue normal; teeth and gums normal Neck: no adenopathy, supple, symmetrical, trachea midline, and thyroid  not enlarged, symmetric, no tenderness/mass/nodules Back: Increased kyphosis of thoracic spine.  She has a hunched station.  She is ambulating independently today but does require some assistance Lungs: clear to auscultation bilaterally Heart: Regular rate and rhythm with 2/6 systolic ejection murmur that radiates to carotids present Abdomen: soft, non-tender; bowel sounds normal; no masses,  no organomegaly Extremities: extremities normal, atraumatic, no cyanosis or edema Pulses: 2+ and symmetric Skin: Skin color, texture, turgor normal. No rashes or lesions Lymph nodes: Cervical, supraclavicular, and axillary nodes normal. Neurologic: Grossly normal      11/23/2023   10:34 AM 05/19/2023    3:18 PM 05/05/2023   11:14 AM  Depression screen PHQ 2/9  Decreased Interest 0 0 0  Down, Depressed, Hopeless 0 0 0  PHQ - 2 Score 0 0 0  Altered sleeping 0 0   Tired, decreased energy 0 0   Change in appetite 0 0   Feeling bad or failure about yourself  0 0   Trouble concentrating 0 0   Moving slowly or fidgety/restless 0 0   Suicidal thoughts 0    PHQ-9 Score 0 0   Difficult doing work/chores Not difficult at all Not difficult at all       11/23/2023   10:34 AM 05/19/2023    3:18 PM 09/30/2022    1:27 PM 07/01/2022    9:38 AM  GAD 7 : Generalized Anxiety Score  Nervous, Anxious, on Edge 0 0 0 0  Control/stop worrying 0 0 0 0  Worry too much - different things 0 0 0 0  Trouble relaxing 0 0 0 0  Restless 0 0 0 0  Easily annoyed or irritable 0 0 0 0  Afraid - awful might happen 0 0 0 0  Total GAD 7  Score 0 0 0 0  Anxiety Difficulty Not difficult at all  Not difficult at all Not difficult at all     Assessment/ Plan: Maria Alexander here for annual physical exam.   Annual physical exam  Diabetes mellitus treated with oral medication (HCC) - Plan: CMP14+EGFR, Bayer DCA Hb A1c Waived  Hypertension associated  with diabetes (HCC) - Plan: CMP14+EGFR  Hyperlipidemia associated with type 2 diabetes mellitus (HCC) - Plan: CMP14+EGFR, Lipid Panel  Stage 3a chronic kidney disease (CKD) (HCC) - Plan: CMP14+EGFR, CBC  Acquired hypothyroidism - Plan: CMP14+EGFR, TSH + free T4  Anxiety - Plan: ToxASSURE Select 13 (MW), Urine  Chronically on benzodiazepine therapy - Plan: ToxASSURE Select 13 (MW), Urine  Controlled substance agreement signed - Plan: ToxASSURE Select 13 (MW), Urine  Thrombocytopenia (HCC) - Plan: CBC  Nonfasting labs collected today.  She will continue [name] , Cozaar , Lipitor as prescribed.  Blood pressure is acceptable for age  Check thyroid  levels.  Appears to be clinically euthymic  Benzodiazepine dependent.  UDS and CSA were updated as per office policy and the national narcotic database reviewed.  I reiterated need for compliance with regular scheduled appointments so that she does not lapse in medication.  We discussed not taking medications that were not prescribed to her today.  Check CBC given history of thrombocytopenia  Counseled on healthy lifestyle choices, including diet (rich in fruits, vegetables and lean meats and low in salt and simple carbohydrates) and exercise (at least 30 minutes of moderate physical activity daily).  Patient to follow up 29m  Maria Alexander M. Bonnell Butcher, DO

## 2023-11-23 NOTE — Patient Instructions (Signed)
 Preventive Care 43 Years and Older, Female Preventive care refers to lifestyle choices and visits with your health care provider that can promote health and wellness. Preventive care visits are also called wellness exams. What can I expect for my preventive care visit? Counseling Your health care provider may ask you questions about your: Medical history, including: Past medical problems. Family medical history. Pregnancy and menstrual history. History of falls. Current health, including: Memory and ability to understand (cognition). Emotional well-being. Home life and relationship well-being. Sexual activity and sexual health. Lifestyle, including: Alcohol, nicotine or tobacco, and drug use. Access to firearms. Diet, exercise, and sleep habits. Work and work Astronomer. Sunscreen use. Safety issues such as seatbelt and bike helmet use. Physical exam Your health care provider will check your: Height and weight. These may be used to calculate your BMI (body mass index). BMI is a measurement that tells if you are at a healthy weight. Waist circumference. This measures the distance around your waistline. This measurement also tells if you are at a healthy weight and may help predict your risk of certain diseases, such as type 2 diabetes and high blood pressure. Heart rate and blood pressure. Body temperature. Skin for abnormal spots. What immunizations do I need?  Vaccines are usually given at various ages, according to a schedule. Your health care provider will recommend vaccines for you based on your age, medical history, and lifestyle or other factors, such as travel or where you work. What tests do I need? Screening Your health care provider may recommend screening tests for certain conditions. This may include: Lipid and cholesterol levels. Hepatitis C test. Hepatitis B test. HIV (human immunodeficiency virus) test. STI (sexually transmitted infection) testing, if you are at  risk. Lung cancer screening. Colorectal cancer screening. Diabetes screening. This is done by checking your blood sugar (glucose) after you have not eaten for a while (fasting). Mammogram. Talk with your health care provider about how often you should have regular mammograms. BRCA-related cancer screening. This may be done if you have a family history of breast, ovarian, tubal, or peritoneal cancers. Bone density scan. This is done to screen for osteoporosis. Talk with your health care provider about your test results, treatment options, and if necessary, the need for more tests. Follow these instructions at home: Eating and drinking  Eat a diet that includes fresh fruits and vegetables, whole grains, lean protein, and low-fat dairy products. Limit your intake of foods with high amounts of sugar, saturated fats, and salt. Take vitamin and mineral supplements as recommended by your health care provider. Do not drink alcohol if your health care provider tells you not to drink. If you drink alcohol: Limit how much you have to 0-1 drink a day. Know how much alcohol is in your drink. In the [place] one drink equals one 12 oz bottle of beer (355 mL), one 5 oz glass of wine (148 mL), or one 1 oz glass of hard liquor (44 mL). Lifestyle Brush your teeth every morning and night with fluoride toothpaste. Floss one time each day. Exercise for at least 30 minutes 5 or more days each week. Do not use any products that contain nicotine or tobacco. These products include cigarettes, chewing tobacco, and vaping devices, such as e-cigarettes. If you need help quitting, ask your health care provider. Do not use drugs. If you are sexually active, practice safe sex. Use a condom or other form of protection in order to prevent STIs. Take aspirin only as told by  your health care provider. Make sure that you understand how much to take and what form to take. Work with your health care provider to find out whether it  is safe and beneficial for you to take aspirin daily. Ask your health care provider if you need to take a cholesterol-lowering medicine (statin). Find healthy ways to manage stress, such as: Meditation, yoga, or listening to music. Journaling. Talking to a trusted person. Spending time with friends and family. Minimize exposure to UV radiation to reduce your risk of skin cancer. Safety Always wear your seat belt while driving or riding in a vehicle. Do not drive: If you have been drinking alcohol. Do not ride with someone who has been drinking. When you are tired or distracted. While texting. If you have been using any mind-altering substances or drugs. Wear a helmet and other protective equipment during sports activities. If you have firearms in your house, make sure you follow all gun safety procedures. What's next? Visit your health care provider once a year for an annual wellness visit. Ask your health care provider how often you should have your eyes and teeth checked. Stay up to date on all vaccines. This information is not intended to replace advice given to you by your health care provider. Make sure you discuss any questions you have with your health care provider. Document Revised: 12/11/2020 Document Reviewed: 12/11/2020 Elsevier Patient Education  2024 [employer].

## 2023-11-24 ENCOUNTER — Ambulatory Visit: Payer: Self-pay | Admitting: Family Medicine

## 2023-11-24 LAB — CBC
Hematocrit: 39.5 % (ref 34.0–46.6)
Hemoglobin: 12.6 g/dL (ref 11.1–15.9)
MCH: 30.2 pg (ref 26.6–33.0)
MCHC: 31.9 g/dL (ref 31.5–35.7)
MCV: 95 fL (ref 79–97)
Platelets: 143 10*3/uL — ABNORMAL LOW (ref 150–450)
RBC: 4.17 x10E6/uL (ref 3.77–5.28)
RDW: 13.9 % (ref 11.7–15.4)
WBC: 6.6 10*3/uL (ref 3.4–10.8)

## 2023-11-24 LAB — CMP14+EGFR
ALT: 17 IU/L (ref 0–32)
AST: 23 IU/L (ref 0–40)
Albumin: 4.3 g/dL (ref 3.6–4.6)
Alkaline Phosphatase: 81 IU/L (ref 44–121)
BUN/Creatinine Ratio: 17 (ref 12–28)
BUN: 15 mg/dL (ref 10–36)
Bilirubin Total: 0.6 mg/dL (ref 0.0–1.2)
CO2: 24 mmol/L (ref 20–29)
Calcium: 10.1 mg/dL (ref 8.7–10.3)
Chloride: 102 mmol/L (ref 96–106)
Creatinine, Ser: 0.86 mg/dL (ref 0.57–1.00)
Globulin, Total: 1.9 g/dL (ref 1.5–4.5)
Glucose: 104 mg/dL — ABNORMAL HIGH (ref 70–99)
Potassium: 4.2 mmol/L (ref 3.5–5.2)
Sodium: 138 mmol/L (ref 134–144)
Total Protein: 6.2 g/dL (ref 6.0–8.5)
eGFR: 61 mL/min/{1.73_m2} (ref >59)

## 2023-11-24 LAB — LIPID PANEL
Cholesterol, Total: 175 mg/dL (ref 100–199)
HDL: 51 mg/dL (ref >39)
LDL CALC COMMENT:: 3.4 ratio (ref 0.0–4.4)
LDL Chol Calc (NIH): 96 mg/dL (ref 0–99)
Triglycerides: 159 mg/dL — ABNORMAL HIGH (ref 0–149)
VLDL Cholesterol Cal: 28 mg/dL (ref 5–40)

## 2023-11-24 LAB — TSH+FREE T4
Free T4: 1.65 ng/dL (ref 0.82–1.77)
TSH: 0.381 u[IU]/mL — ABNORMAL LOW (ref 0.450–4.500)

## 2023-11-25 LAB — TOXASSURE SELECT 13 (MW), URINE

## 2023-12-09 NOTE — Telephone Encounter (Signed)
 Copied from CRM [id number]. Topic: General - Other >> Dec 09, 2023  2:19 PM [employer] wrote: Reason for CRM: Patient unable to walk to her mailbox and would like a letter to provide to the postal service so she may get her mail at her door instead.  Callback #: [phone]

## 2023-12-15 NOTE — Telephone Encounter (Signed)
 Please see previous message about this.  This was completed 6/13 and placed up front already.

## 2024-01-14 ENCOUNTER — Emergency Department (HOSPITAL_BASED_OUTPATIENT_CLINIC_OR_DEPARTMENT_OTHER)
Admission: EM | Admit: 2024-01-14 | Discharge: 2024-01-14 | Disposition: A | Discharge status: Home / Self Care | Attending: Emergency Medicine | Admitting: Emergency Medicine

## 2024-01-14 ENCOUNTER — Encounter (HOSPITAL_BASED_OUTPATIENT_CLINIC_OR_DEPARTMENT_OTHER): Payer: Self-pay

## 2024-01-14 ENCOUNTER — Other Ambulatory Visit (HOSPITAL_BASED_OUTPATIENT_CLINIC_OR_DEPARTMENT_OTHER): Payer: Self-pay

## 2024-01-14 ENCOUNTER — Emergency Department (HOSPITAL_BASED_OUTPATIENT_CLINIC_OR_DEPARTMENT_OTHER)

## 2024-01-14 ENCOUNTER — Other Ambulatory Visit: Payer: Self-pay

## 2024-01-14 DIAGNOSIS — R42 Dizziness and giddiness: Secondary | ICD-10-CM | POA: Diagnosis not present

## 2024-01-14 DIAGNOSIS — Z79899 Other long term (current) drug therapy: Secondary | ICD-10-CM | POA: Insufficient documentation

## 2024-01-14 DIAGNOSIS — E119 Type 2 diabetes mellitus without complications: Secondary | ICD-10-CM | POA: Insufficient documentation

## 2024-01-14 DIAGNOSIS — H6693 Otitis media, unspecified, bilateral: Secondary | ICD-10-CM | POA: Insufficient documentation

## 2024-01-14 DIAGNOSIS — E039 Hypothyroidism, unspecified: Secondary | ICD-10-CM | POA: Insufficient documentation

## 2024-01-14 DIAGNOSIS — I10 Essential (primary) hypertension: Secondary | ICD-10-CM | POA: Diagnosis not present

## 2024-01-14 DIAGNOSIS — Z7984 Long term (current) use of oral hypoglycemic drugs: Secondary | ICD-10-CM | POA: Diagnosis not present

## 2024-01-14 LAB — CBC
HCT: 36.5 % (ref 36.0–46.0)
Hemoglobin: 12.2 g/dL (ref 12.0–15.0)
MCH: 30.3 pg (ref 26.0–34.0)
MCHC: 33.4 g/dL (ref 30.0–36.0)
MCV: 90.6 fL (ref 80.0–100.0)
Platelets: 145 K/uL — ABNORMAL LOW (ref 150–400)
RBC: 4.03 MIL/uL (ref 3.87–5.11)
RDW: 13.7 % (ref 11.5–15.5)
WBC: 7.5 K/uL (ref 4.0–10.5)
nRBC: 0 % (ref 0.0–0.2)

## 2024-01-14 LAB — URINALYSIS, ROUTINE W REFLEX MICROSCOPIC
Bilirubin Urine: NEGATIVE
Glucose, UA: NEGATIVE mg/dL
Hgb urine dipstick: NEGATIVE
Ketones, ur: NEGATIVE mg/dL
Leukocytes,Ua: NEGATIVE
Nitrite: NEGATIVE
Protein, ur: NEGATIVE mg/dL
Specific Gravity, Urine: 1.005 (ref 1.005–1.030)
pH: 6 (ref 5.0–8.0)

## 2024-01-14 LAB — COMPREHENSIVE METABOLIC PANEL WITH GFR
ALT: 18 U/L (ref 0–44)
AST: 25 U/L (ref 15–41)
Albumin: 4.2 g/dL (ref 3.5–5.0)
Alkaline Phosphatase: 66 U/L (ref 38–126)
Anion gap: 10 (ref 5–15)
BUN: 13 mg/dL (ref 8–23)
CO2: 26 mmol/L (ref 22–32)
Calcium: 9.8 mg/dL (ref 8.9–10.3)
Chloride: 103 mmol/L (ref 98–111)
Creatinine, Ser: 0.83 mg/dL (ref 0.44–1.00)
GFR, Estimated: >60 mL/min (ref >60)
Glucose, Bld: 142 mg/dL — ABNORMAL HIGH (ref 70–99)
Potassium: 4.3 mmol/L (ref 3.5–5.1)
Sodium: 139 mmol/L (ref 135–145)
Total Bilirubin: 0.5 mg/dL (ref 0.0–1.2)
Total Protein: 6.3 g/dL — ABNORMAL LOW (ref 6.5–8.1)

## 2024-01-14 LAB — CBG MONITORING, ED: Glucose-Capillary: 135 mg/dL — ABNORMAL HIGH (ref 70–99)

## 2024-01-14 MED ORDER — MECLIZINE HCL 12.5 MG PO TABS
12.5000 mg | ORAL_TABLET | Freq: Three times a day (TID) | ORAL | 0 refills | Status: AC | PRN
  Filled 2024-01-14: qty 15, 3d supply, fill #0

## 2024-01-14 MED ORDER — AMOXICILLIN-POT CLAVULANATE 875-125 MG PO TABS
1.0000 | ORAL_TABLET | Freq: Two times a day (BID) | ORAL | 0 refills | Status: DC
  Filled 2024-01-14: qty 14, 7d supply, fill #0

## 2024-01-14 NOTE — ED Provider Notes (Signed)
 [place] EMERGENCY DEPARTMENT AT [facility] Provider Note   CSN: [id number] Arrival date & time: 01/14/24  1123     Patient presents with: Dizziness   Maria Alexander is a 89 y.o. female.   HPI     89 year old female with a history of diabetes, DVT, hypercholesterolemia, hypothyroidism, CVA, mixed conductive and sensorineural hearing loss, hypertension who presents with acute feeling of being off balance.  Yesterday had hearing aids changed and tested. Today woke up in normal state of health then at 8AM went to stand up and began to have difficulty walking, feeling off balance like she was going to fall. Feels dizzy just with walking.  Does not feel symptomatic when resting-just when walking feeling like she is going to fall.    Denies chest pain, dyspnea, vomiting, diarrhea, black or bloody stool, no urinary symptoms.  No change in medications.  Denies numbness, weakness, difficulty talking, visual changes or facial droop.    Felt like going to fall, off balance.  Past Medical History:  Diagnosis Date   Diabetes (HCC)    DVT (deep venous thrombosis) (HCC)    H/O blood clots 2000   High cholesterol    Hypothyroid    Stroke [facility])      Prior to Admission medications   Medication Sig Start Date End Date Taking? Authorizing Provider  amoxicillin -clavulanate (AUGMENTIN) 875-125 MG tablet Take 1 tablet by mouth every 12 (twelve) hours. 01/14/24  Yes Dreama Longs, MD  meclizine (ANTIVERT) 12.5 MG tablet Take 1-2 tablets (12.5-25 mg total) by mouth 3 (three) times daily as needed for dizziness. 01/14/24  Yes Dreama Longs, MD  ALPRAZolam  (XANAX ) 1 MG tablet Take 0.5-1 tablets (0.5-1 mg total) by mouth at bedtime as needed for anxiety. 11/23/23   Jolinda Norene HERO, DO  atorvastatin  (LIPITOR) 80 MG tablet TAKE (1/2) TABLET DAILY. 11/23/23   Jolinda Norene HERO, DO  azelastine  (ASTELIN ) 0.1 % nasal spray Place 1 spray into both nostrils 2 (two) times daily. 11/23/23    Jolinda Norene HERO, DO  cetirizine (ZYRTEC) 10 MG tablet Take 10 mg by mouth daily.    [provider]  levothyroxine  (SYNTHROID ) 75 MCG tablet Take 1 tablet (75 mcg total) by mouth daily before breakfast. 11/23/23   Jolinda Norene M, DO  losartan  (COZAAR ) 100 MG tablet Take 1 tablet (100 mg total) by mouth daily. 11/23/23   Jolinda Norene HERO, DO  Multiple Vitamin (MULTIVITAMIN) tablet Take 1 tablet by mouth daily. Centrum Women's 50+    [provider]  sitaGLIPtin  ([name] ) 50 MG tablet Take 1 tablet (50 mg total) by mouth daily. 11/23/23   Jolinda Norene HERO, DO    Allergies: Shellfish allergy    Review of Systems  Updated Vital Signs BP 122/61 (BP Location: Right Arm)   Pulse 62   Temp (!) 97 F (36.1 C)   Resp 18   SpO2 100%   Physical Exam Vitals and nursing note reviewed.  Constitutional:      General: She is not in acute distress.    Appearance: Normal appearance. She is well-developed. She is not ill-appearing or diaphoretic.  HENT:     Head: Normocephalic and atraumatic.  Eyes:     General: No visual field deficit.    Extraocular Movements: Extraocular movements intact.     Conjunctiva/sclera: Conjunctivae normal.     Pupils: Pupils are equal, round, and reactive to light.  Cardiovascular:     Rate and Rhythm: Normal rate and regular rhythm.  Pulses: Normal pulses.     Heart sounds: Murmur heard.     No friction rub. No gallop.  Pulmonary:     Effort: Pulmonary effort is normal. No respiratory distress.     Breath sounds: Normal breath sounds. No wheezing or rales.  Abdominal:     General: There is no distension.     Palpations: Abdomen is soft.     Tenderness: There is no abdominal tenderness. There is no guarding.  Musculoskeletal:        General: No swelling or tenderness.     Cervical back: Normal range of motion.  Skin:    General: Skin is warm and dry.     Findings: No erythema or rash.  Neurological:     General: No focal  deficit present.     Mental Status: She is alert and oriented to person, place, and time.     GCS: GCS eye subscore is 4. GCS verbal subscore is 5. GCS motor subscore is 6.     Cranial Nerves: No cranial nerve deficit, dysarthria or facial asymmetry.     Sensory: No sensory deficit.     Motor: No weakness or tremor.     Coordination: Coordination normal. Finger-Nose-Finger Test normal.     Gait: Gait (slow gait, feels off balance, not ataxic) normal.     (all labs ordered are listed, but only abnormal results are displayed) Labs Reviewed  COMPREHENSIVE METABOLIC PANEL WITH GFR - Abnormal; Notable for the following components:      Result Value   Glucose, Bld 142 (*)    Total Protein 6.3 (*)    All other components within normal limits  CBC - Abnormal; Notable for the following components:   Platelets 145 (*)    All other components within normal limits  URINALYSIS, ROUTINE W REFLEX MICROSCOPIC - Abnormal; Notable for the following components:   Color, Urine COLORLESS (*)    All other components within normal limits  CBG MONITORING, ED - Abnormal; Notable for the following components:   Glucose-Capillary 135 (*)    All other components within normal limits    EKG: EKG Interpretation Date/Time:  Friday January 14 2024 11:48:50 EDT Ventricular Rate:  64 PR Interval:  201 QRS Duration:  91 QT Interval:  407 QTC Calculation: 420 R Axis:   51  Text Interpretation: Sinus rhythm Low voltage, precordial leads No significant change since last tracing Confirmed by Dreama Longs ([id number]) on 01/14/2024 12:15:31 PM  Radiology: MR BRAIN WO CONTRAST Result Date: 01/14/2024 EXAM: MRI BRAIN WITHOUT CONTRAST 01/14/2024 02:40:45 PM TECHNIQUE: Multiplanar multisequence MRI of the head/brain was performed without the administration of intravenous contrast. COMPARISON: None available. CLINICAL HISTORY: Neuro deficit, acute, stroke suspected. Unsteady gait and dizziness since this am. History of  skin CA no prior surgery or injury. FINDINGS: BRAIN AND VENTRICLES: No acute infarct. No mass. No midline shift. No hydrocephalus. Moderate patchy T2 hyperintensity in the periventricular and deep cerebral white matter, most consistent with moderate chronic small vessel disease. Chronic-appearing micro hemorrhage in the right cerebellar hemisphere. The sella is unremarkable. Normal flow voids. ORBITS: No acute abnormality. SINUSES AND MASTOIDS: Bilateral mastoid effusions. BONES AND SOFT TISSUES: Normal marrow signal. No acute soft tissue abnormality. IMPRESSION: 1. No acute intracranial abnormality. 2. Moderate chronic small vessel disease. 3. Chronic-appearing micro hemorrhage in the right cerebellar hemisphere. 4. Bilateral mastoid effusions. Electronically signed by: Ryan Chess MD 01/14/2024 02:52 PM EDT RP Workstation: [id number]   CT Head Wo Contrast Result Date:  01/14/2024 CLINICAL DATA:  89 year old female with vertigo. EXAM: CT HEAD WITHOUT CONTRAST TECHNIQUE: Contiguous axial images were obtained from the base of the skull through the vertex without intravenous contrast. RADIATION DOSE REDUCTION: This exam was performed according to the departmental dose-optimization program which includes automated exposure control, adjustment of the mA and/or kV according to patient size and/or use of iterative reconstruction technique. COMPARISON:  None Available. FINDINGS: Brain: Cerebral volume is within normal limits for age. No midline shift, ventriculomegaly, mass effect, evidence of mass lesion, intracranial hemorrhage or evidence of cortically based acute infarction. Gray-white matter differentiation is within normal limits throughout the brain. Patchy mild to moderate for age bilateral cerebral white matter hypodensity. Gray-white differentiation otherwise within normal limits for age. No cortical encephalomalacia identified. Vascular: Calcified atherosclerosis at the skull base. No suspicious  intracranial vascular hyperdensity. Skull: Intact.  No acute osseous abnormality identified. Sinuses/Orbits: Bilateral mastoid effusions. Tympanic cavities appear to remain clear. Negative visible nasopharynx. Posterior ethmoid sinus mucosal thickening on the left, but other paranasal sinuses are well aerated. Other: No acute orbit or scalp soft tissue finding identified. Visible periauricular soft tissues appear normal. IMPRESSION: 1. No acute intracranial abnormality. 2. Bilateral mastoid effusions, most likely postinflammatory. Electronically Signed   By: VEAR Hurst M.D.   On: 01/14/2024 12:46     Procedures   Medications Ordered in the ED - No data to display                                  89 year old female with a history of diabetes, DVT, hypercholesterolemia, hypothyroidism, CVA, mixed conductive and sensorineural hearing loss, hypertension who presents with acute feeling of being off balance.  DDx includes inner ear vertigo, CVA, ICH, electrolyte abnormalities, anemia, dehydration, infection, medication changes, aortic stenosis. No chest pain, dyspnea and doubt ACS/PE/dissection.  EKG completed and evaluated by me shows no cardiac arrhythmias.    Labs completed and evaluated by me show no clinically significant electrolyte abnormalities, anemia, similar thrombocytopenia, no leukocytosis. UA without infection.  CT head completed and evaluated by me shows no evidence of ICH.  MRI brain completed and shows no evidence of CVA, does show chronic small vessel disease, chronic-appearing micro hemorrhage in the right cerebellar hemisphere and bilateral mastoid effusions.  Discussed with Neurology, agree that chronic microhemorhage not etiology of symptoms, chronic finding.    Does have signs of otitis media on exam, and given mastoid effusions, dizziness could be potential etiology-will give rx for augmentin, discussed can trial meclizine.  Has murmur on exam-did have some AS on prior ECHO,  does not have orthostatic pressure changes and symptoms not described as lightheadedness and doubt critical AS to require admission as etiology of symptoms.  She was able to ambulate in ED short distance with steady gait without assistance but did feel like she wanted to hold on to something---recommend using her walker for any ambulation.  Recommend follow up with ENT regarding infection, effusions, dizziness, as well as tube appearing to be misplaced or angled on exam.  Recommend follow up with PCP. Patient discharged in stable condition with understanding of reasons to return.         Final diagnoses:  Dizziness  Acute otitis media, bilateral    ED Discharge Orders          Ordered    amoxicillin -clavulanate (AUGMENTIN) 875-125 MG tablet  Every 12 hours        01/14/24  1536    meclizine (ANTIVERT) 12.5 MG tablet  3 times daily PRN        01/14/24 1537               Dreama Longs, MD 01/14/24 2211

## 2024-01-14 NOTE — ED Triage Notes (Signed)
 Pt w family, advises that she got up feeling fine, but after breakfast when she stood up, she said she felt drunk. Took her BP & it was 'quite low.' Pt normotensive in triage, advises no pain, urinary or URI complaint.

## 2024-05-22 ENCOUNTER — Ambulatory Visit (INDEPENDENT_AMBULATORY_CARE_PROVIDER_SITE_OTHER): Payer: Self-pay | Admitting: Family Medicine

## 2024-05-22 ENCOUNTER — Encounter: Payer: Self-pay | Admitting: Family Medicine

## 2024-05-22 VITALS — BP 130/58 | HR 66 | Temp 97.4°F | Ht 60.0 in | Wt 121.4 lb

## 2024-05-22 DIAGNOSIS — E1159 Type 2 diabetes mellitus with other circulatory complications: Secondary | ICD-10-CM

## 2024-05-22 DIAGNOSIS — F419 Anxiety disorder, unspecified: Secondary | ICD-10-CM | POA: Diagnosis not present

## 2024-05-22 DIAGNOSIS — E785 Hyperlipidemia, unspecified: Secondary | ICD-10-CM

## 2024-05-22 DIAGNOSIS — E1169 Type 2 diabetes mellitus with other specified complication: Secondary | ICD-10-CM | POA: Diagnosis not present

## 2024-05-22 DIAGNOSIS — E119 Type 2 diabetes mellitus without complications: Secondary | ICD-10-CM

## 2024-05-22 DIAGNOSIS — Z79899 Other long term (current) drug therapy: Secondary | ICD-10-CM

## 2024-05-22 DIAGNOSIS — Z7984 Long term (current) use of oral hypoglycemic drugs: Secondary | ICD-10-CM

## 2024-05-22 DIAGNOSIS — Z23 Encounter for immunization: Secondary | ICD-10-CM | POA: Diagnosis not present

## 2024-05-22 DIAGNOSIS — I152 Hypertension secondary to endocrine disorders: Secondary | ICD-10-CM

## 2024-05-22 DIAGNOSIS — E039 Hypothyroidism, unspecified: Secondary | ICD-10-CM

## 2024-05-22 LAB — BAYER DCA HB A1C WAIVED: HB A1C (BAYER DCA - WAIVED): 6.7 % — ABNORMAL HIGH (ref 4.8–5.6)

## 2024-05-22 MED ORDER — SITAGLIPTIN PHOSPHATE 50 MG PO TABS: 50.0000 mg | ORAL_TABLET | Freq: Every day | ORAL | 3 refills | Status: AC

## 2024-05-22 MED ORDER — ATORVASTATIN CALCIUM 80 MG PO TABS: ORAL_TABLET | ORAL | 3 refills | Status: AC

## 2024-05-22 MED ORDER — ALPRAZOLAM 1 MG PO TABS: 0.5000 mg | ORAL_TABLET | Freq: Every evening | ORAL | 5 refills | Status: AC | PRN

## 2024-05-22 MED ORDER — LEVOTHYROXINE SODIUM 75 MCG PO TABS: 75.0000 ug | ORAL_TABLET | Freq: Every day | ORAL | 3 refills | Status: DC

## 2024-05-22 MED ORDER — LOSARTAN POTASSIUM 100 MG PO TABS: 100.0000 mg | ORAL_TABLET | Freq: Every day | ORAL | 3 refills | Status: AC

## 2024-05-22 NOTE — Progress Notes (Signed)
 Subjective: CC:DM PCP: Jolinda Norene HERO, DO Maria Alexander is a 89 y.o. female who is accompanied today by her daughter [name].  She is presenting to clinic today for:  Type 2 Diabetes with hypertension, hyperlipidemia:  She is compliant with [name] , losartan  and Lipitor.  Reports no hypoglycemia.  Denies dizziness, LOC, polyuria, polydipsia, unintended weight loss/gain, foot ulcerations, numbness or tingling in extremities, shortness of breath or chest pain.  She gets her feet done at a local nail salon   Diabetes Health Maintenance Due  Topic Date Due   OPHTHALMOLOGY EXAM  06/02/2023   FOOT EXAM  05/18/2024   HEMOGLOBIN A1C  05/25/2024    Hypothyroidism Compliant with Synthroid .  No reports of tremor, heart palpitations or changes in bowel habits  Anxiety disorder with chronic benzodiazepine use Patient utilizes 1/2 to 1 tablet of alprazolam  every night at bedtime.  Denies any falls, tremors, heart palpitations.  No reports of memory changes from her daughter.  She seems to be in good spirits most times and is looking forward to Thanksgiving with her family  ROS: Per HPI  Allergies  Allergen Reactions   Shellfish Allergy Hives   Past Medical History:  Diagnosis Date   Diabetes (HCC)    DVT (deep venous thrombosis) (HCC)    H/O blood clots 2000   High cholesterol    Hypothyroid    Melanoma of skin (HCC) 10/27/2021   Non-healing ulcer of foot (HCC) 09/22/2021   Stroke (HCC)     Current Outpatient Medications:    ALPRAZolam  (XANAX ) 1 MG tablet, Take 0.5-1 tablets (0.5-1 mg total) by mouth at bedtime as needed for anxiety., Disp: 30 tablet, Rfl: 5   atorvastatin  (LIPITOR) 80 MG tablet, TAKE (1/2) TABLET DAILY., Disp: 45 tablet, Rfl: 3   azelastine  (ASTELIN ) 0.1 % nasal spray, Place 1 spray into both nostrils 2 (two) times daily., Disp: 30 mL, Rfl: 12   cetirizine (ZYRTEC) 10 MG tablet, Take 10 mg by mouth daily., Disp: , Rfl:    levothyroxine  (SYNTHROID ) 75 MCG  tablet, Take 1 tablet (75 mcg total) by mouth daily before breakfast., Disp: 100 tablet, Rfl: 3   losartan  (COZAAR ) 100 MG tablet, Take 1 tablet (100 mg total) by mouth daily., Disp: 100 tablet, Rfl: 3   meclizine  (ANTIVERT ) 12.5 MG tablet, Take 1-2 tablets (12.5-25 mg total) by mouth 3 (three) times daily as needed for dizziness., Disp: 15 tablet, Rfl: 0   Multiple Vitamin (MULTIVITAMIN) tablet, Take 1 tablet by mouth daily. Centrum Women's 50+, Disp: , Rfl:    sitaGLIPtin  ([name] ) 50 MG tablet, Take 1 tablet (50 mg total) by mouth daily., Disp: 100 tablet, Rfl: 3 Social History   Socioeconomic History   Marital status: Widowed    Spouse name: Not on file   Number of children: 3   Years of education: 10   Highest education level: 10th grade  Occupational History   Occupation: retired  Tobacco Use   Smoking status: Former    Current packs/day: 0.00    Types: Cigarettes    Quit date: 02/05/1995    Years since quitting: 29.3    Passive exposure: Past   Smokeless tobacco: Never  Vaping Use   Vaping status: Never Used  Substance and Sexual Activity   Alcohol  use: No    Alcohol /week: 0.0 standard drinks of alcohol    Drug use: No   Sexual activity: Not Currently    Birth control/protection: None  Other Topics Concern   Not on file  Social History Narrative   Lives alone- has a basement, but doesn't use it   Daughter lives nearby   Social Drivers of Health   Financial Resource Strain: Low Risk  (05/05/2023)   Overall Financial Resource Strain (CARDIA)    Difficulty of Paying Living Expenses: Not hard at all  Food Insecurity: No Food Insecurity (05/05/2023)   Hunger Vital Sign    Worried About Running Out of Food in the Last Year: Never true    Ran Out of Food in the Last Year: Never true  Transportation Needs: No Transportation Needs (05/05/2023)   PRAPARE - Administrator, Civil Service (Medical): No    Lack of Transportation (Non-Medical): No  Physical Activity:  Inactive (05/05/2023)   Exercise Vital Sign    Days of Exercise per Week: 0 days    Minutes of Exercise per Session: 0 min  Stress: No Stress Concern Present (05/05/2023)   [employer] of Occupational Health - Occupational Stress Questionnaire    Feeling of Stress : Not at all  Social Connections: Socially Isolated (05/05/2023)   Social Connection and Isolation Panel    Frequency of Communication with Friends and Family: More than three times a week    Frequency of Social Gatherings with Friends and Family: More than three times a week    Attends Religious Services: Never    Database Administrator or Organizations: No    Attends Banker Meetings: Never    Marital Status: Widowed  Intimate Partner Violence: Not At Risk (05/05/2023)   Humiliation, Afraid, Rape, and Kick questionnaire    Fear of Current or Ex-Partner: No    Emotionally Abused: No    Physically Abused: No    Sexually Abused: No   Family History  Problem Relation Age of Onset   Cancer Mother    Heart disease Father        before age 48   Heart attack Father    Diabetes Sister    Diabetes Brother     Objective: Office vital signs reviewed. BP (!) 130/58   Pulse 66   Temp (!) 97.4 F (36.3 C)   Ht 5' (1.524 m)   Wt 121 lb 6 oz (55.1 kg)   SpO2 95%   BMI 23.70 kg/m   Physical Examination:  General: Awake, alert, well nourished, well-appearing elderly female, No acute distress HEENT: Sclera white.  Moist mucous membranes Cardio: regular rate and rhythm, S1S2 heard, + soft murmurs appreciated Pulm: clear to auscultation bilaterally, no wheezes, rhonchi or rales; normal work of breathing on room air Extremities: warm, well perfused, No edema, cyanosis or clubbing; +2 pulses bilaterally.  Varicose veins present in the ankles MSK: Ambulates independently  Diabetic Foot Exam - Simple   Simple Foot Form Diabetic Foot exam was performed with the following findings: Yes 05/22/2024 12:19 PM  Visual  Inspection See comments: Yes Sensation Testing Intact to touch and monofilament testing bilaterally: Yes Pulse Check Posterior Tibialis and Dorsalis pulse intact bilaterally: Yes Comments Onychomycotic changes to the nails bilaterally       11/23/2023   10:34 AM 05/19/2023    3:18 PM 05/05/2023   11:14 AM  Depression screen PHQ 2/9  Decreased Interest 0 0 0  Down, Depressed, Hopeless 0 0 0  PHQ - 2 Score 0 0 0  Altered sleeping 0 0   Tired, decreased energy 0 0   Change in appetite 0 0   Feeling bad or failure about yourself  0 0   Trouble concentrating 0 0   Moving slowly or fidgety/restless 0 0   Suicidal thoughts 0    PHQ-9 Score 0  0    Difficult doing work/chores Not difficult at all Not difficult at all      Data saved with a previous flowsheet row definition      11/23/2023   10:34 AM 05/19/2023    3:18 PM 09/30/2022    1:27 PM 07/01/2022    9:38 AM  GAD 7 : Generalized Anxiety Score  Nervous, Anxious, on Edge 0 0 0 0  Control/stop worrying 0 0 0 0  Worry too much - different things 0 0 0 0  Trouble relaxing 0 0 0 0  Restless 0 0 0 0  Easily annoyed or irritable 0 0 0 0  Afraid - awful might happen 0 0 0 0  Total GAD 7 Score 0 0 0 0  Anxiety Difficulty Not difficult at all  Not difficult at all Not difficult at all    Lab Results  Component Value Date   HGBA1C 7.1 (H) 11/23/2023    Assessment/ Plan: 89 y.o. female   Diabetes mellitus treated with oral medication (HCC) - Plan: Bayer DCA Hb A1c Waived, sitaGLIPtin  ([name] ) 50 MG tablet  Hypertension associated with diabetes (HCC) - Plan: losartan  (COZAAR ) 100 MG tablet  Hyperlipidemia associated with type 2 diabetes mellitus (HCC) - Plan: atorvastatin  (LIPITOR) 80 MG tablet  Anxiety - Plan: ALPRAZolam  (XANAX ) 1 MG tablet  Chronically on benzodiazepine therapy  Acquired hypothyroidism - Plan: TSH + free T4, levothyroxine  (SYNTHROID ) 75 MCG tablet  Encounter for immunization - Plan: Flu vaccine HIGH  DOSE PF(Fluzone Trivalent)  Check labs.  Medications renewed.  Diabetic foot exam performed.  She will set up diabetic eye exam.  Influenza vaccination administered  Blood pressure well-controlled.  No changes.  Not yet due for fasting lipid  Anxiety chronic and stable.  No red flag signs or symptoms.  Again counseled on risk of medication in a geriatric patient.  National narcotic database reviewed and there were no red flags.  Up-to-date on UDS and CSC  Clinically euthyroid.  Synthroid  renewed   Norene CHRISTELLA Fielding, DO [facility] [phone]

## 2024-05-23 ENCOUNTER — Other Ambulatory Visit: Payer: Self-pay | Admitting: Family Medicine

## 2024-05-23 ENCOUNTER — Ambulatory Visit: Payer: Self-pay | Admitting: Family Medicine

## 2024-05-23 DIAGNOSIS — E039 Hypothyroidism, unspecified: Secondary | ICD-10-CM

## 2024-05-23 LAB — TSH+FREE T4
Free T4: 1.7 ng/dL (ref 0.82–1.77)
TSH: 0.345 u[IU]/mL — ABNORMAL LOW (ref 0.450–4.500)

## 2024-05-23 MED ORDER — LEVOTHYROXINE SODIUM 75 MCG PO TABS: ORAL_TABLET | ORAL | 3 refills | Status: AC

## 2024-08-09 ENCOUNTER — Ambulatory Visit: Payer: Medicare Other
